# Patient Record
Sex: Female | Born: 1940
Health system: Southern US, Community
[De-identification: ages and names within clinical notes are randomized; demographics above are authoritative.]

## PROBLEM LIST (undated history)

## (undated) DIAGNOSIS — Z1379 Encounter for other screening for genetic and chromosomal anomalies: Secondary | ICD-10-CM

## (undated) DIAGNOSIS — R011 Cardiac murmur, unspecified: Secondary | ICD-10-CM

## (undated) DIAGNOSIS — M1990 Unspecified osteoarthritis, unspecified site: Secondary | ICD-10-CM

## (undated) DIAGNOSIS — Z803 Family history of malignant neoplasm of breast: Secondary | ICD-10-CM

## (undated) DIAGNOSIS — C801 Malignant (primary) neoplasm, unspecified: Secondary | ICD-10-CM

## (undated) DIAGNOSIS — E039 Hypothyroidism, unspecified: Secondary | ICD-10-CM

## (undated) DIAGNOSIS — M1710 Unilateral primary osteoarthritis, unspecified knee: Secondary | ICD-10-CM

## (undated) DIAGNOSIS — I10 Essential (primary) hypertension: Secondary | ICD-10-CM

## (undated) DIAGNOSIS — K219 Gastro-esophageal reflux disease without esophagitis: Secondary | ICD-10-CM

## (undated) DIAGNOSIS — Z8489 Family history of other specified conditions: Secondary | ICD-10-CM

## (undated) DIAGNOSIS — E785 Hyperlipidemia, unspecified: Secondary | ICD-10-CM

## (undated) DIAGNOSIS — M179 Osteoarthritis of knee, unspecified: Secondary | ICD-10-CM

## (undated) DIAGNOSIS — I728 Aneurysm of other specified arteries: Secondary | ICD-10-CM

## (undated) DIAGNOSIS — R634 Abnormal weight loss: Secondary | ICD-10-CM

## (undated) DIAGNOSIS — R42 Dizziness and giddiness: Secondary | ICD-10-CM

## (undated) DIAGNOSIS — C50919 Malignant neoplasm of unspecified site of unspecified female breast: Secondary | ICD-10-CM

## (undated) DIAGNOSIS — I739 Peripheral vascular disease, unspecified: Secondary | ICD-10-CM

## (undated) DIAGNOSIS — IMO0001 Reserved for inherently not codable concepts without codable children: Secondary | ICD-10-CM

## (undated) HISTORY — DX: Abnormal weight loss: R63.4

## (undated) HISTORY — DX: Gastro-esophageal reflux disease without esophagitis: K21.9

## (undated) HISTORY — PX: BACK SURGERY: SHX140

## (undated) HISTORY — DX: Dizziness and giddiness: R42

## (undated) HISTORY — DX: Hyperlipidemia, unspecified: E78.5

## (undated) HISTORY — DX: Family history of malignant neoplasm of breast: Z80.3

## (undated) HISTORY — PX: VAGINAL PROLAPSE REPAIR: SHX830

## (undated) HISTORY — PX: LUMBAR LAMINECTOMY: SHX95

## (undated) HISTORY — PX: CHOLECYSTECTOMY: SHX55

## (undated) HISTORY — DX: Encounter for other screening for genetic and chromosomal anomalies: Z13.79

## (undated) HISTORY — DX: Reserved for inherently not codable concepts without codable children: IMO0001

## (undated) HISTORY — DX: Essential (primary) hypertension: I10

## (undated) HISTORY — DX: Unspecified osteoarthritis, unspecified site: M19.90

## (undated) HISTORY — PX: BREAST SURGERY: SHX581

## (undated) HISTORY — PX: KNEE SURGERY: SHX244

## (undated) HISTORY — DX: Malignant neoplasm of unspecified site of unspecified female breast: C50.919

## (undated) HISTORY — PX: ABDOMINAL HYSTERECTOMY: SHX81

## (undated) HISTORY — DX: Peripheral vascular disease, unspecified: I73.9

---

## 2006-04-28 ENCOUNTER — Encounter: Admission: RE | Admit: 2006-04-28 | Discharge: 2006-04-28 | Payer: Self-pay | Admitting: Family Medicine

## 2010-02-15 ENCOUNTER — Encounter: Admission: RE | Admit: 2010-02-15 | Discharge: 2010-02-15 | Payer: Self-pay | Admitting: Family Medicine

## 2010-08-12 ENCOUNTER — Ambulatory Visit: Payer: Self-pay | Admitting: Surgery

## 2010-09-11 ENCOUNTER — Ambulatory Visit (HOSPITAL_COMMUNITY): Admission: RE | Admit: 2010-09-11 | Discharge: 2010-09-11 | Payer: Self-pay | Admitting: Gastroenterology

## 2011-04-15 NOTE — Assessment & Plan Note (Signed)
OFFICE VISIT   Crystal Trevino, Crystal Trevino  DOB:  04-Aug-1941                                       08/12/2010  OVFIE#:[id number]   REASON FOR VISIT:  Evaluate splenic aneurysm.   HISTORY:  This is a 70 year old female seen at the request of Dr. Selena Batten for evaluation of a splenic artery aneurysm.  The patient began  complaining of right upper quadrant abdominal pain as well as bloating  in March.  She was sent for a CT scan and a densely calcified 1.6 cm  splenic artery aneurysm was identified.  This was an incidental finding.  The patient denies having pain associated with this.   The patient is medically managed for hypertension and  hypercholesterolemia.  She is a nonsmoker.   REVIEW OF SYSTEMS:  GENERAL:  Positive for recent history, weight loss.  VASCULAR:  Negative.  CARDIAC:  Positive for shortness breath with exertion.  GI:  Positive for reflux, trouble swallowing, constipation.  NEURO:  Positive for dizziness.  PULMONARY:  Negative.  HEMATOLOGIC:  Negative.  GU: Negative.  ENT is negative.  MUSCULOSKELETAL:  Positive for arthritis and joint pain.  PSYCHIATRIC:  Negative.  SKIN:  Negative.   PAST MEDICAL HISTORY:  Hypertension and hypercholesterolemia.   PAST SURGICAL HISTORY:  Lumbar laminectomy, hysterectomy,  cholecystectomy, knee surgery.   SOCIAL HISTORY:  She is single with 4 children.  Works in Anim[employer].  Does not drink.  Does not smoke.   FAMILY HISTORY:  Negative for premature cardiovascular disease.   PHYSICAL EXAMINATION:  Vital signs:  Heart rate 78, blood pressure  133/75,  O2 saturations are 99%  on room air.  General:  She is well-  appearing, in no distress.  HEENT:  Within normal limits.  Lungs:  Clear  bilaterally.  No rales, rhonchi or wheezes.  Cardiovascular:  Regular  rate and rhythm.  No murmur.  No carotid bruits.  She has palpable  posterior tibial pulses bilaterally.  Abdomen:  Soft, nontender, no  hepatosplenomegaly.  Musculoskeletal:  Without major deformity.  Neurologic:  She is no focal deficits or weakness.  Skin:  Without rash.   DIAGNOSTIC STUDIES:  I have reviewed her CT scan which shows a heavily  calcified splenic artery aneurysm near the hilum measuring 1.6 cm in  maximal diameter.   ASSESSMENT/PLAN:  Splenic artery aneurysm.   PLAN:  I have  educated the patient about splenic artery aneurysms  today.  This most likely has been present for a long time.  However, it  was just incidentally discovered.  Being less than 2 cm, I think the  risk for rupture is extremely low.  I would not recommend any  intervention at this time.  I do think that since we only have one data  point  regarding her aneurysm that we will need to follow this.  I have  recommended a CT scan in 1 year to see if it has changed.  Hopefully we  can space out the time frame which we would monitor it.  I think the  best way to evaluate it is with CT scan.  Ultrasound may be possible.  However, it may be difficult to identify and measure on ultrasound.  I  will plan on seeing her back in 1 year.  All of her questions were  answered  todayJorge Ny, MD  Electronically Signed   VWB/MEDQ  D:  08/12/2010  T:  08/13/2010  Job:  [id number]   cc:   Pam Drown, M.D.

## 2011-04-16 ENCOUNTER — Other Ambulatory Visit: Payer: Self-pay | Admitting: Surgery

## 2011-04-16 DIAGNOSIS — I728 Aneurysm of other specified arteries: Secondary | ICD-10-CM

## 2011-07-21 ENCOUNTER — Encounter: Payer: Self-pay | Admitting: Surgery

## 2011-08-13 DIAGNOSIS — R42 Dizziness and giddiness: Secondary | ICD-10-CM

## 2011-08-13 HISTORY — DX: Dizziness and giddiness: R42

## 2011-08-14 ENCOUNTER — Other Ambulatory Visit: Payer: Self-pay | Admitting: Surgery

## 2011-08-15 ENCOUNTER — Encounter: Payer: Self-pay | Admitting: Surgery

## 2011-08-15 LAB — BUN: BUN: 17 mg/dL (ref 6–23)

## 2011-08-15 LAB — CREATININE, SERUM: Creat: 0.84 mg/dL (ref 0.50–1.10)

## 2011-08-18 ENCOUNTER — Encounter: Payer: Self-pay | Admitting: Surgery

## 2011-08-18 ENCOUNTER — Ambulatory Visit (INDEPENDENT_AMBULATORY_CARE_PROVIDER_SITE_OTHER): Payer: Federal, State, Local not specified - PPO | Admitting: Surgery

## 2011-08-18 ENCOUNTER — Ambulatory Visit
Admission: RE | Admit: 2011-08-18 | Discharge: 2011-08-18 | Disposition: A | Discharge status: Home / Self Care | Payer: Federal, State, Local not specified - PPO | Source: Ambulatory Visit | Attending: Surgery | Admitting: Surgery

## 2011-08-18 VITALS — BP 147/78 | HR 63 | Resp 16 | Ht 66.0 in | Wt 172.5 lb

## 2011-08-18 DIAGNOSIS — I728 Aneurysm of other specified arteries: Secondary | ICD-10-CM

## 2011-08-18 MED ORDER — IOHEXOL 350 MG/ML SOLN
100.0000 mL | Freq: Once | INTRAVENOUS | Status: AC | PRN
  Administered 2011-08-18: 100 mL via INTRAVENOUS

## 2011-08-18 NOTE — Progress Notes (Signed)
Vascular and Vein Specialist of [facility]   Patient name: Crystal Trevino MRN: [id number] DOB: [birth year] Sex: female   Referred by: Selena Batten  Reason for referral:  Chief Complaint  Patient presents with  . Aneurysm    followup  splenic artery aneurysm /ct done today    HISTORY OF PRESENT ILLNESS: This is a 70 year old female with I met one year ago for the evaluation of a splenic artery aneurysm. This was detected when she underwent a CT scan for right upper quadrant pain as well as bloating. This was an incidental finding. She continues to be without symptoms related to this. She denies left flank pain she denies back pain or epigastric pain.  Past Medical History  Diagnosis Date  . Hypertension   . Hyperlipidemia   . Arthritis   . Reflux   . Weight loss   . Constipation 08/13/2011  . Dizziness 08/13/2011  . AAA (abdominal aortic aneurysm)   . Peripheral vascular disease     Past Surgical History  Procedure Date  . Abdominal hysterectomy   . Cholecystectomy   . Lumbar laminectomy   . Knee surgery   . Pr vein bypass graft,aorto-fem-pop     History   Social History  . Marital Status: Legally Separated    Spouse Name: N/A    Number of Children: N/A  . Years of Education: N/A   Occupational History  . Not on file.   Social History Main Topics  . Smoking status: Never Smoker   . Smokeless tobacco: Never Used  . Alcohol Use: No  . Drug Use: No  . Sexually Active: Not on file   Other Topics Concern  . Not on file   Social History Narrative  . No narrative on file    No family history on file.  Allergies as of 08/18/2011 - Review Complete 08/18/2011  Allergen Reaction Noted  . Penicillins  07/21/2011    Current Outpatient Prescriptions on File Prior to Visit  Medication Sig Dispense Refill  . aspirin 81 MG tablet Take 81 mg by mouth daily.        . carvedilol (COREG) 12.5 MG tablet Take 12.5 mg by mouth 2 (two) times daily with a meal.        .  estrogens, conjugated, (PREMARIN) 0.625 MG tablet Take 0.625 mg by mouth daily. Take daily for 21 days then do not take for 7 days.       . fluticasone (FLOVENT DISKUS) 50 MCG/BLIST diskus inhaler Inhale 2 puffs into the lungs 2 (two) times daily.        Marland Kitchen levothyroxine (SYNTHROID, LEVOTHROID) 50 MCG tablet Take 50 mcg by mouth daily.        . Multiple Vitamin (MULTIVITAMIN PO) Take 1 tablet by mouth daily.        . simvastatin (ZOCOR) 20 MG tablet Take 20 mg by mouth at bedtime.        Marland Kitchen telmisartan-hydrochlorothiazide (MICARDIS HCT) 40-12.5 MG per tablet Take 1 tablet by mouth daily.        . clopidogrel (PLAVIX) 75 MG tablet Take 75 mg by mouth daily.        Marland Kitchen dicyclomine (BENTYL) 10 MG capsule Take 10 mg by mouth 4 (four) times daily -  before meals and at bedtime.        . gabapentin (NEURONTIN) 300 MG capsule Take 300 mg by mouth 3 (three) times daily.        Marland Kitchen lisinopril-hydrochlorothiazide (PRINZIDE,ZESTORETIC) 10-12.5 MG  per tablet Take 1 tablet by mouth 2 (two) times daily.        . methadone (DOLOPHINE) 5 MG tablet Take 5 mg by mouth every 8 (eight) hours.        . tapentadol (NUCYNTA) 50 MG TABS Take by mouth.         No current facility-administered medications on file prior to visit.     REVIEW OF SYSTEMS: Cardiovascular: No chest pain, chest pressure, palpitations, orthopnea, or dyspnea on exertion. No claudication or rest pain,  No history of DVT or phlebitis. Pulmonary: No productive cough, asthma or wheezing. Neurologic: No weakness, paresthesias, aphasia, or amaurosis. No dizziness. Hematologic: No bleeding problems or clotting disorders. Musculoskeletal: No joint pain or joint swelling. Gastrointestinal: No blood in stool or hematemesis Genitourinary: No dysuria or hematuria. Psychiatric:: No history of major depression. Integumentary: No rashes or ulcers. Constitutional: No fever or chills.  PHYSICAL EXAMINATION: General: The patient appears their stated age.  Vital  signs are BP 147/78  Pulse 63  Resp 16  Ht 5\' 6"  (1.676 m)  Wt 172 lb 8 oz (78.245 kg)  BMI 27.84 kg/m2 Pulmonary: There is a good air exchange bilaterally without wheezing or rales. Abdomen: Soft and non-tender with normal pitch bowel sounds. Musculoskeletal: There are no major deformities.  There is no significant extremity pain. Neurologic: No focal weakness or paresthesias are detected, Skin: There are no ulcer or rashes noted.  Diagnostic studies: CT scan today shows no significant change in her splenic aneurysm.  Crystal Trevino is a 70 y.o. female who presents with:  Chief Complaint  Patient presents with  . Aneurysm    followup  splenic artery aneurysm /ct done today   The patient splenic aneurysm has been stable over the course of the past year. She remains without symptoms. We discussed that if she were to develop pain in this area what to do. L. plan on seeing her back in one year with an abdominal ultrasound hopefully we will be able to visualize this with ultrasound to avoid putting her through multiple CAT scans. I think the likelihood of this rupturing is very small. I'll see her back in one year     Medication changes include: none   BRABHAM,VANCE W [facility] [place] Office: [phone]

## 2011-08-18 NOTE — Progress Notes (Signed)
Addended by: Sharee Pimple on: 08/18/2011 12:56 PM   Modules accepted: Orders

## 2012-08-20 ENCOUNTER — Encounter: Payer: Self-pay | Admitting: Surgery

## 2012-08-23 ENCOUNTER — Ambulatory Visit (INDEPENDENT_AMBULATORY_CARE_PROVIDER_SITE_OTHER): Payer: Federal, State, Local not specified - PPO | Admitting: Surgery

## 2012-08-23 ENCOUNTER — Ambulatory Visit (INDEPENDENT_AMBULATORY_CARE_PROVIDER_SITE_OTHER): Payer: Federal, State, Local not specified - PPO | Admitting: Vascular Surgery

## 2012-08-23 ENCOUNTER — Encounter: Payer: Self-pay | Admitting: Surgery

## 2012-08-23 VITALS — BP 143/79 | HR 67 | Resp 16 | Ht 66.0 in | Wt 172.0 lb

## 2012-08-23 DIAGNOSIS — I728 Aneurysm of other specified arteries: Secondary | ICD-10-CM

## 2012-08-23 NOTE — Progress Notes (Signed)
Vascular and Vein Specialist of [facility]   Patient name: Crystal Trevino MRN: [id number] DOB: 1941-05-21 Sex: female     Chief Complaint  Patient presents with  . Aneurysm    One year Splenic Aneurysm  f/up with vascular Lab study today    HISTORY OF PRESENT ILLNESS: The patient comes back today for followup of her splenic artery aneurysm. This was detected in 2011 point she underwent a CT scan for right upper quadrant pain and bloating. A CT scan last year showed a stable aneurysm in the splenic hilum measuring 1.6 cm. She is back today for followup. She denies any symptoms of left flank pain or abdominal pain.  Past Medical History  Diagnosis Date  . Hypertension   . Hyperlipidemia   . Arthritis   . Reflux   . Weight loss   . Constipation 08/13/2011  . Dizziness 08/13/2011  . AAA (abdominal aortic aneurysm)   . Peripheral vascular disease     Past Surgical History  Procedure Date  . Abdominal hysterectomy   . Cholecystectomy   . Lumbar laminectomy   . Knee surgery   . Pr vein bypass graft,aorto-fem-pop     History   Social History  . Marital Status: Legally Separated    Spouse Name: N/A    Number of Children: N/A  . Years of Education: N/A   Occupational History  . Not on file.   Social History Main Topics  . Smoking status: Never Smoker   . Smokeless tobacco: Never Used  . Alcohol Use: No  . Drug Use: No  . Sexually Active: Not on file   Other Topics Concern  . Not on file   Social History Narrative  . No narrative on file    Family History  Problem Relation Age of Onset  . Heart disease Mother   . Cancer Mother   . Heart disease Father   . Cancer Father     Allergies as of 08/23/2012 - Review Complete 08/23/2012  Allergen Reaction Noted  . Penicillins Nausea And Vomiting and Rash 07/21/2011    Current Outpatient Prescriptions on File Prior to Visit  Medication Sig Dispense Refill  . aspirin 81 MG tablet Take 81 mg by mouth daily.          . carvedilol (COREG) 12.5 MG tablet Take 12.5 mg by mouth 2 (two) times daily with a meal.        . estrogens, conjugated, (PREMARIN) 0.625 MG tablet Take 0.625 mg by mouth daily. Take daily for 21 days then do not take for 7 days.       . fluticasone (FLOVENT DISKUS) 50 MCG/BLIST diskus inhaler Inhale 2 puffs into the lungs 2 (two) times daily.        Marland Kitchen levothyroxine (SYNTHROID, LEVOTHROID) 50 MCG tablet Take 50 mcg by mouth daily.        . Multiple Vitamin (MULTIVITAMIN PO) Take 1 tablet by mouth daily.        . simvastatin (ZOCOR) 20 MG tablet Take 20 mg by mouth at bedtime.        Marland Kitchen telmisartan-hydrochlorothiazide (MICARDIS HCT) 40-12.5 MG per tablet Take 1 tablet by mouth daily.        . clopidogrel (PLAVIX) 75 MG tablet Take 75 mg by mouth daily.        Marland Kitchen dicyclomine (BENTYL) 10 MG capsule Take 10 mg by mouth 4 (four) times daily -  before meals and at bedtime.        Marland Kitchen  gabapentin (NEURONTIN) 300 MG capsule Take 300 mg by mouth 3 (three) times daily.        Marland Kitchen lisinopril-hydrochlorothiazide (PRINZIDE,ZESTORETIC) 10-12.5 MG per tablet Take 1 tablet by mouth 2 (two) times daily.        . methadone (DOLOPHINE) 5 MG tablet Take 5 mg by mouth every 8 (eight) hours.        . tapentadol (NUCYNTA) 50 MG TABS Take by mouth.           REVIEW OF SYSTEMS: No change since prior visit  PHYSICAL EXAMINATION:   Vital signs are BP 143/79  Pulse 67  Resp 16  Ht 5\' 6"  (1.676 m)  Wt 172 lb (78.019 kg)  BMI 27.76 kg/m2  SpO2 100% General: The patient appears their stated age. HEENT:  No gross abnormalities Pulmonary:  Non labored breathing Abdomen: Soft and non-tender no left upper quadrant tenderness Musculoskeletal: There are no major deformities. Neurologic: No focal weakness or paresthesias are detected, Skin: There are no ulcer or rashes noted. Psychiatric: The patient has normal affect. Cardiovascular: There is a regular rate and rhythm without significant murmur appreciated. No carotid  bruits. Palpable left posterior tibial pulse faintly palpable right posterior tibial. No swelling   Diagnostic Studies Ultrasound was ordered and reviewed today. Due to bowel gas and small vessel caliber the splenic artery aneurysm could not be identified  Assessment: Splenic artery aneurysm Plan: Unfortunately this could not be visualized today by ultrasound. I believe since the patient has had a CT scan one year apart that shows a stable diameter, this can be observed again in one year. I believe I'll have to order a CT scan since it could not be visualized today with ultrasound. The patient remained asymptomatic. I reiterated that this has been present most likely for many many years. Since that has remained stable I did not feel that we need to get a CT scan at this time.  Jorge Ny, M.D. [facility] of [place] Office: [phone] Pager:  [phone]

## 2012-08-23 NOTE — Addendum Note (Signed)
Addended by: Sharee Pimple on: 08/23/2012 10:58 AM   Modules accepted: Orders

## 2013-08-26 ENCOUNTER — Encounter: Payer: Self-pay | Admitting: Surgery

## 2013-08-26 ENCOUNTER — Other Ambulatory Visit: Payer: Self-pay | Admitting: Surgery

## 2013-08-29 ENCOUNTER — Ambulatory Visit: Payer: Federal, State, Local not specified - PPO | Admitting: Surgery

## 2013-08-29 ENCOUNTER — Ambulatory Visit
Admission: RE | Admit: 2013-08-29 | Discharge: 2013-08-29 | Disposition: A | Discharge status: Home / Self Care | Payer: Federal, State, Local not specified - PPO | Source: Ambulatory Visit | Attending: Surgery | Admitting: Surgery

## 2013-08-29 DIAGNOSIS — I728 Aneurysm of other specified arteries: Secondary | ICD-10-CM

## 2013-08-29 LAB — BUN: BUN: 16 mg/dL (ref 6–23)

## 2013-08-29 LAB — CREATININE, SERUM: Creat: 0.94 mg/dL (ref 0.50–1.10)

## 2013-09-05 ENCOUNTER — Other Ambulatory Visit: Payer: Self-pay | Admitting: Family Medicine

## 2013-09-05 ENCOUNTER — Other Ambulatory Visit: Payer: Federal, State, Local not specified - PPO

## 2013-09-05 ENCOUNTER — Ambulatory Visit: Payer: Federal, State, Local not specified - PPO | Admitting: Surgery

## 2013-09-05 ENCOUNTER — Other Ambulatory Visit: Payer: Self-pay

## 2013-09-05 DIAGNOSIS — I728 Aneurysm of other specified arteries: Secondary | ICD-10-CM

## 2013-09-13 ENCOUNTER — Ambulatory Visit
Admission: RE | Admit: 2013-09-13 | Discharge: 2013-09-13 | Disposition: A | Discharge status: Home / Self Care | Payer: Federal, State, Local not specified - PPO | Source: Ambulatory Visit | Attending: Family Medicine | Admitting: Family Medicine

## 2013-09-13 ENCOUNTER — Other Ambulatory Visit: Payer: Federal, State, Local not specified - PPO

## 2013-09-13 DIAGNOSIS — I728 Aneurysm of other specified arteries: Secondary | ICD-10-CM

## 2013-09-13 MED ORDER — IOHEXOL 350 MG/ML SOLN
100.0000 mL | Freq: Once | INTRAVENOUS | Status: AC | PRN
  Administered 2013-09-13: 100 mL via INTRAVENOUS

## 2013-10-12 ENCOUNTER — Telehealth: Payer: Self-pay | Admitting: Surgery

## 2013-10-12 NOTE — Telephone Encounter (Signed)
Left message with Harriett Sine to have patient call. Need to set up CTA and office visit that was not done in October.

## 2013-11-14 ENCOUNTER — Ambulatory Visit: Payer: Federal, State, Local not specified - PPO | Admitting: Surgery

## 2014-09-01 ENCOUNTER — Other Ambulatory Visit: Payer: Self-pay | Admitting: Orthopedic Surgery

## 2014-09-01 DIAGNOSIS — R609 Edema, unspecified: Secondary | ICD-10-CM

## 2014-09-01 DIAGNOSIS — M25561 Pain in right knee: Secondary | ICD-10-CM

## 2014-09-01 DIAGNOSIS — R531 Weakness: Secondary | ICD-10-CM

## 2014-09-09 ENCOUNTER — Ambulatory Visit
Admission: RE | Admit: 2014-09-09 | Discharge: 2014-09-09 | Disposition: A | Discharge status: Home / Self Care | Payer: Federal, State, Local not specified - PPO | Source: Ambulatory Visit | Attending: Orthopedic Surgery | Admitting: Orthopedic Surgery

## 2014-09-09 DIAGNOSIS — R531 Weakness: Secondary | ICD-10-CM

## 2014-09-09 DIAGNOSIS — M25561 Pain in right knee: Secondary | ICD-10-CM

## 2014-09-09 DIAGNOSIS — R609 Edema, unspecified: Secondary | ICD-10-CM

## 2016-04-30 DIAGNOSIS — I10 Essential (primary) hypertension: Secondary | ICD-10-CM | POA: Diagnosis not present

## 2016-04-30 DIAGNOSIS — E782 Mixed hyperlipidemia: Secondary | ICD-10-CM | POA: Diagnosis not present

## 2016-04-30 DIAGNOSIS — E039 Hypothyroidism, unspecified: Secondary | ICD-10-CM | POA: Diagnosis not present

## 2016-04-30 DIAGNOSIS — R7301 Impaired fasting glucose: Secondary | ICD-10-CM | POA: Diagnosis not present

## 2016-05-02 DIAGNOSIS — Z1231 Encounter for screening mammogram for malignant neoplasm of breast: Secondary | ICD-10-CM | POA: Diagnosis not present

## 2016-05-02 DIAGNOSIS — Z803 Family history of malignant neoplasm of breast: Secondary | ICD-10-CM | POA: Diagnosis not present

## 2016-09-18 DIAGNOSIS — Z23 Encounter for immunization: Secondary | ICD-10-CM | POA: Diagnosis not present

## 2016-10-29 DIAGNOSIS — E039 Hypothyroidism, unspecified: Secondary | ICD-10-CM | POA: Diagnosis not present

## 2016-10-29 DIAGNOSIS — Z0000 Encounter for general adult medical examination without abnormal findings: Secondary | ICD-10-CM | POA: Diagnosis not present

## 2017-02-12 DIAGNOSIS — J069 Acute upper respiratory infection, unspecified: Secondary | ICD-10-CM | POA: Diagnosis not present

## 2017-05-15 DIAGNOSIS — I10 Essential (primary) hypertension: Secondary | ICD-10-CM | POA: Diagnosis not present

## 2017-05-15 DIAGNOSIS — E039 Hypothyroidism, unspecified: Secondary | ICD-10-CM | POA: Diagnosis not present

## 2017-05-15 DIAGNOSIS — E782 Mixed hyperlipidemia: Secondary | ICD-10-CM | POA: Diagnosis not present

## 2017-05-15 DIAGNOSIS — M85851 Other specified disorders of bone density and structure, right thigh: Secondary | ICD-10-CM | POA: Diagnosis not present

## 2017-05-15 DIAGNOSIS — R7301 Impaired fasting glucose: Secondary | ICD-10-CM | POA: Diagnosis not present

## 2017-09-07 DIAGNOSIS — Z23 Encounter for immunization: Secondary | ICD-10-CM | POA: Diagnosis not present

## 2017-09-17 DIAGNOSIS — M11261 Other chondrocalcinosis, right knee: Secondary | ICD-10-CM | POA: Diagnosis not present

## 2017-09-17 DIAGNOSIS — G8929 Other chronic pain: Secondary | ICD-10-CM | POA: Diagnosis not present

## 2017-09-17 DIAGNOSIS — M1711 Unilateral primary osteoarthritis, right knee: Secondary | ICD-10-CM | POA: Diagnosis not present

## 2017-09-17 DIAGNOSIS — M25561 Pain in right knee: Secondary | ICD-10-CM | POA: Diagnosis not present

## 2017-10-09 DIAGNOSIS — R7301 Impaired fasting glucose: Secondary | ICD-10-CM | POA: Diagnosis not present

## 2017-10-09 DIAGNOSIS — E039 Hypothyroidism, unspecified: Secondary | ICD-10-CM | POA: Diagnosis not present

## 2017-10-09 DIAGNOSIS — N993 Prolapse of vaginal vault after hysterectomy: Secondary | ICD-10-CM | POA: Diagnosis not present

## 2017-10-09 DIAGNOSIS — M1711 Unilateral primary osteoarthritis, right knee: Secondary | ICD-10-CM | POA: Diagnosis not present

## 2017-10-30 ENCOUNTER — Other Ambulatory Visit: Payer: Self-pay | Admitting: Orthopedic Surgery

## 2017-12-09 DIAGNOSIS — Z1231 Encounter for screening mammogram for malignant neoplasm of breast: Secondary | ICD-10-CM | POA: Diagnosis not present

## 2017-12-09 DIAGNOSIS — Z803 Family history of malignant neoplasm of breast: Secondary | ICD-10-CM | POA: Diagnosis not present

## 2017-12-18 DIAGNOSIS — R921 Mammographic calcification found on diagnostic imaging of breast: Secondary | ICD-10-CM | POA: Diagnosis not present

## 2017-12-21 ENCOUNTER — Other Ambulatory Visit: Payer: Self-pay | Admitting: Radiology

## 2017-12-21 DIAGNOSIS — R920 Mammographic microcalcification found on diagnostic imaging of breast: Secondary | ICD-10-CM | POA: Diagnosis not present

## 2017-12-21 DIAGNOSIS — D0511 Intraductal carcinoma in situ of right breast: Secondary | ICD-10-CM | POA: Diagnosis not present

## 2017-12-21 DIAGNOSIS — Z803 Family history of malignant neoplasm of breast: Secondary | ICD-10-CM | POA: Diagnosis not present

## 2017-12-21 DIAGNOSIS — R921 Mammographic calcification found on diagnostic imaging of breast: Secondary | ICD-10-CM | POA: Diagnosis not present

## 2017-12-23 ENCOUNTER — Encounter: Payer: Self-pay | Admitting: *Deleted

## 2017-12-23 ENCOUNTER — Telehealth: Payer: Self-pay | Admitting: Oncology

## 2017-12-23 DIAGNOSIS — N993 Prolapse of vaginal vault after hysterectomy: Secondary | ICD-10-CM | POA: Diagnosis not present

## 2017-12-23 DIAGNOSIS — D0511 Intraductal carcinoma in situ of right breast: Secondary | ICD-10-CM

## 2017-12-23 DIAGNOSIS — C50919 Malignant neoplasm of unspecified site of unspecified female breast: Secondary | ICD-10-CM | POA: Diagnosis not present

## 2017-12-23 NOTE — Telephone Encounter (Signed)
Confirmed with the patient morning [facility] appointment on 12/30/16, [facility] patient no packet sent but appointment reminder was sent

## 2017-12-25 ENCOUNTER — Encounter (HOSPITAL_COMMUNITY): Payer: Self-pay

## 2017-12-25 NOTE — Pre-Procedure Instructions (Signed)
Crystal Trevino  12/25/2017      [id number] - [place], [place] [address] [place] [address] Phone: [phone] Fax: [phone]    Your procedure is scheduled on Monday, Feb. 4th   Report to [facility] Admitting at 5:30 AM             (posted surgery time 7:30a - 8:45a)   Call this number if you have problems the morning of surgery:  [phone]   Remember:               4-5 days prior to surgery, STOP TAKING any Vitamins, Herbal Supplements, Anti-inflammatories, Blood Thinners.   Do not eat food or drink liquids after midnight, Sunday.   Take these medicines the morning of surgery with A SIP OF WATER : Carvedilol, Levothyroxine   Do not wear jewelry, make-up or nail polish.  Do not wear lotions, powders, perfumes, or deodorant.  Do not shave 48 hours prior to surgery.    Do not bring valuables to the hospital.  [facility] is not responsible for any belongings or valuables.  Contacts, dentures or bridgework may not be worn into surgery.  Leave your suitcase in the car.  After surgery it may be brought to your room.  For patients admitted to the hospital, discharge time will be determined by your treatment team.  Please read over the following fact sheets that you were given. Pain Booklet, MRSA Information and Surgical Site Infection Prevention      [place]- Preparing For Surgery  Before surgery, you can play an important role. Because skin is not sterile, your skin needs to be as free of germs as possible. You can reduce the number of germs on your skin by washing with CHG (chlorahexidine gluconate) Soap before surgery.  CHG is an antiseptic cleaner which kills germs and bonds with the skin to continue killing germs even after washing.  Please do not use if you have an allergy to CHG or antibacterial soaps. If your skin becomes reddened/irritated stop using the CHG.  Do not shave (including legs and underarms) for  at least 48 hours prior to first CHG shower. It is OK to shave your face.  Please follow these instructions carefully.   1. Shower the NIGHT BEFORE SURGERY and the MORNING OF SURGERY with CHG.   2. If you chose to wash your hair, wash your hair first as usual with your normal shampoo.  3. After you shampoo, rinse your hair and body thoroughly to remove the shampoo.  4. Use CHG as you would any other liquid soap. You can apply CHG directly to the skin and wash gently with a scrungie or a clean washcloth.   5. Apply the CHG Soap to your body ONLY FROM THE NECK DOWN.  Do not use on open wounds or open sores. Avoid contact with your eyes, ears, mouth and genitals (private parts). Wash Face and genitals (private parts)  with your normal soap.  6. Wash thoroughly, paying special attention to the area where your surgery will be performed.  7. Thoroughly rinse your body with warm water from the neck down.  8. DO NOT shower/wash with your normal soap after using and rinsing off the CHG Soap.  9. Pat yourself dry with a CLEAN TOWEL.  10. Wear CLEAN PAJAMAS to bed the night before surgery, wear comfortable clothes the morning of surgery  11. Place CLEAN SHEETS on your bed the  night of your first shower and DO NOT SLEEP WITH PETS.    Day of Surgery: Do not apply any deodorants/lotions. Please wear clean clothes to the hospital/surgery center.

## 2017-12-28 ENCOUNTER — Other Ambulatory Visit: Payer: Self-pay

## 2017-12-28 ENCOUNTER — Encounter (HOSPITAL_COMMUNITY): Payer: Self-pay

## 2017-12-28 ENCOUNTER — Encounter (HOSPITAL_COMMUNITY)
Admission: RE | Admit: 2017-12-28 | Discharge: 2017-12-28 | Disposition: A | Discharge status: Home / Self Care | Payer: Federal, State, Local not specified - PPO | Source: Ambulatory Visit | Attending: Orthopedic Surgery | Admitting: Orthopedic Surgery

## 2017-12-28 DIAGNOSIS — E039 Hypothyroidism, unspecified: Secondary | ICD-10-CM | POA: Diagnosis not present

## 2017-12-28 DIAGNOSIS — I728 Aneurysm of other specified arteries: Secondary | ICD-10-CM | POA: Insufficient documentation

## 2017-12-28 DIAGNOSIS — Z79890 Hormone replacement therapy (postmenopausal): Secondary | ICD-10-CM | POA: Diagnosis not present

## 2017-12-28 DIAGNOSIS — Z01818 Encounter for other preprocedural examination: Secondary | ICD-10-CM | POA: Insufficient documentation

## 2017-12-28 DIAGNOSIS — K219 Gastro-esophageal reflux disease without esophagitis: Secondary | ICD-10-CM | POA: Diagnosis not present

## 2017-12-28 DIAGNOSIS — Z79899 Other long term (current) drug therapy: Secondary | ICD-10-CM | POA: Insufficient documentation

## 2017-12-28 DIAGNOSIS — I10 Essential (primary) hypertension: Secondary | ICD-10-CM | POA: Diagnosis not present

## 2017-12-28 DIAGNOSIS — E785 Hyperlipidemia, unspecified: Secondary | ICD-10-CM | POA: Insufficient documentation

## 2017-12-28 DIAGNOSIS — Z01812 Encounter for preprocedural laboratory examination: Secondary | ICD-10-CM | POA: Diagnosis not present

## 2017-12-28 DIAGNOSIS — Z7982 Long term (current) use of aspirin: Secondary | ICD-10-CM | POA: Diagnosis not present

## 2017-12-28 HISTORY — DX: Gastro-esophageal reflux disease without esophagitis: K21.9

## 2017-12-28 HISTORY — DX: Hypothyroidism, unspecified: E03.9

## 2017-12-28 HISTORY — DX: Cardiac murmur, unspecified: R01.1

## 2017-12-28 HISTORY — DX: Malignant (primary) neoplasm, unspecified: C80.1

## 2017-12-28 HISTORY — DX: Aneurysm of other specified arteries: I72.8

## 2017-12-28 LAB — CBC WITH DIFFERENTIAL/PLATELET
Basophils Absolute: 0 10*3/uL (ref 0.0–0.1)
Basophils Relative: 0 %
Eosinophils Absolute: 0.2 10*3/uL (ref 0.0–0.7)
Eosinophils Relative: 3 %
HCT: 37.9 % (ref 36.0–46.0)
Hemoglobin: 12.3 g/dL (ref 12.0–15.0)
Lymphocytes Relative: 24 %
Lymphs Abs: 1.6 10*3/uL (ref 0.7–4.0)
MCH: 30.5 pg (ref 26.0–34.0)
MCHC: 32.5 g/dL (ref 30.0–36.0)
MCV: 94 fL (ref 78.0–100.0)
Monocytes Absolute: 0.5 10*3/uL (ref 0.1–1.0)
Monocytes Relative: 8 %
Neutro Abs: 4.4 10*3/uL (ref 1.7–7.7)
Neutrophils Relative %: 65 %
Platelets: 223 10*3/uL (ref 150–400)
RBC: 4.03 MIL/uL (ref 3.87–5.11)
RDW: 13.8 % (ref 11.5–15.5)
WBC: 6.6 10*3/uL (ref 4.0–10.5)

## 2017-12-28 LAB — COMPREHENSIVE METABOLIC PANEL
ALT: 18 U/L (ref 14–54)
AST: 23 U/L (ref 15–41)
Albumin: 4 g/dL (ref 3.5–5.0)
Alkaline Phosphatase: 59 U/L (ref 38–126)
Anion gap: 12 (ref 5–15)
BUN: 14 mg/dL (ref 6–20)
CO2: 22 mmol/L (ref 22–32)
Calcium: 9.3 mg/dL (ref 8.9–10.3)
Chloride: 105 mmol/L (ref 101–111)
Creatinine, Ser: 0.93 mg/dL (ref 0.44–1.00)
GFR calc Af Amer: >60 mL/min (ref >60)
GFR calc non Af Amer: 59 mL/min — ABNORMAL LOW (ref >60)
Glucose, Bld: 113 mg/dL — ABNORMAL HIGH (ref 65–99)
Potassium: 4.3 mmol/L (ref 3.5–5.1)
Sodium: 139 mmol/L (ref 135–145)
Total Bilirubin: 1.3 mg/dL — ABNORMAL HIGH (ref 0.3–1.2)
Total Protein: 6.6 g/dL (ref 6.5–8.1)

## 2017-12-28 LAB — SURGICAL PCR SCREEN
MRSA, PCR: NEGATIVE
Staphylococcus aureus: POSITIVE — AB

## 2017-12-28 NOTE — Progress Notes (Signed)
PCP is Dr. Amedeo Gory  LOV 10/2017 She stated she did have lower aortic aneurysm, was seen by Dr. Trula Slade in 2013 Denies any murmur, cardiac studies nor seeing a cardiologist. Recent dx of ?? Breast cancer.  Going to see Dr. Sondra Come on 1/30 @ [facility].

## 2017-12-29 ENCOUNTER — Encounter (HOSPITAL_COMMUNITY): Payer: Self-pay

## 2017-12-29 NOTE — Progress Notes (Signed)
Anesthesia Chart Review:  Pt is a 77 year old female scheduled for R total knee arthroplasty on 01/04/2018 with Vickey Huger, MD  - PCP is Cari Caraway, MD who cleared pt for surgery at office visit 10/09/17  PMH includes:  HTN, hyperlipidemia, heart murmur (1/6 systolic ejection murmur loudest at R upper sternal border per PCP notes), splenic artery aneurysm (1.5 cm by 2014 CT, no further surveillance recommended), hypothyroidism, GERD. Never smoker. BMI 27  Medications include: ASA 81 mg, carvedilol, levothyroxine, simvastatin, telmisartan  BP (!) 164/63   Pulse 76   Temp 36.7 C   Resp 20   Ht 5\' 6"  (1.676 m)   Wt 168 lb 11.2 oz (76.5 kg)   SpO2 98%   BMI 27.23 kg/m   Preoperative labs reviewed.    EKG 12/28/17: NSR  If no changes, I anticipate pt can proceed with surgery as scheduled.   Willeen Cass, FNP-BC [facility] Stay [facility]/Anesthesiology Phone: [phone] 12/29/2017 12:28 PM

## 2017-12-30 ENCOUNTER — Encounter: Payer: Self-pay | Admitting: Oncology

## 2017-12-30 ENCOUNTER — Inpatient Hospital Stay: Payer: Federal, State, Local not specified - PPO | Attending: Oncology | Admitting: Oncology

## 2017-12-30 ENCOUNTER — Encounter: Payer: Self-pay | Admitting: *Deleted

## 2017-12-30 ENCOUNTER — Ambulatory Visit: Payer: Federal, State, Local not specified - PPO | Admitting: Physical Therapy

## 2017-12-30 ENCOUNTER — Ambulatory Visit: Payer: Self-pay | Admitting: Surgery

## 2017-12-30 ENCOUNTER — Ambulatory Visit
Admission: RE | Admit: 2017-12-30 | Discharge: 2017-12-30 | Disposition: A | Discharge status: Home / Self Care | Payer: Federal, State, Local not specified - PPO | Source: Ambulatory Visit | Attending: Radiation Oncology | Admitting: Radiation Oncology

## 2017-12-30 ENCOUNTER — Other Ambulatory Visit: Payer: Self-pay | Admitting: *Deleted

## 2017-12-30 VITALS — BP 160/74 | HR 83 | Temp 97.7°F | Resp 18 | Ht 66.0 in | Wt 170.1 lb

## 2017-12-30 DIAGNOSIS — N952 Postmenopausal atrophic vaginitis: Secondary | ICD-10-CM | POA: Diagnosis not present

## 2017-12-30 DIAGNOSIS — Z170 Estrogen receptor positive status [ER+]: Secondary | ICD-10-CM | POA: Insufficient documentation

## 2017-12-30 DIAGNOSIS — N993 Prolapse of vaginal vault after hysterectomy: Secondary | ICD-10-CM | POA: Diagnosis not present

## 2017-12-30 DIAGNOSIS — D0591 Unspecified type of carcinoma in situ of right breast: Secondary | ICD-10-CM

## 2017-12-30 DIAGNOSIS — C50911 Malignant neoplasm of unspecified site of right female breast: Secondary | ICD-10-CM | POA: Diagnosis not present

## 2017-12-30 DIAGNOSIS — M1711 Unilateral primary osteoarthritis, right knee: Secondary | ICD-10-CM | POA: Diagnosis not present

## 2017-12-30 DIAGNOSIS — E039 Hypothyroidism, unspecified: Secondary | ICD-10-CM | POA: Diagnosis not present

## 2017-12-30 DIAGNOSIS — D0511 Intraductal carcinoma in situ of right breast: Secondary | ICD-10-CM

## 2017-12-30 DIAGNOSIS — Z803 Family history of malignant neoplasm of breast: Secondary | ICD-10-CM | POA: Diagnosis not present

## 2017-12-30 MED ORDER — GABAPENTIN 100 MG PO CAPS: 100.0000 mg | ORAL_CAPSULE | Freq: Every day | ORAL | 4 refills | Status: DC

## 2017-12-30 NOTE — Progress Notes (Signed)
Clinical Social Work [place] Psychosocial Distress Screening [place]  Patient completed distress screening protocol and scored a 10 on the Psychosocial Distress Thermometer which indicates severe distress. Clinical Social Worker met with patient and patients daughter in [facility] to assess for distress and other psychosocial needs. Patient stated she was feeling overwhelmed but felt "better" after meeting with the treatment team and getting more information on her treatment plan.  Patient stated her major concern was her upcoming knee surgery, and being able to move forward with both the surgery and treatment.  CSW and patient discussed common feeling and emotions when being diagnosed with cancer, and the importance of support during treatment. CSW informed patient of the support team and support services at [facility]. CSW provided contact information and encouraged patient to call with any questions or concerns.  ONCBCN DISTRESS SCREENING 12/30/2017  Screening Type Initial Screening  Distress experienced in past week (1-10) 10  Emotional problem type Nervousness/Anxiety     Johnnye Lana, MSW, LCSW, OSW-C Clinical Social Worker [place] [phone]

## 2017-12-30 NOTE — Progress Notes (Unsigned)
Nutrition Assessment  Reason for Assessment:  Pt seen in Breast Clinic  ASSESSMENT:   77 year old female with new diagnosis of breast cancer.  Planning knee replacement surgery on 2/4. Past medical history of HTN, high cholesterol.    Patient reports decreased appetite due to new diagnosis and upcoming knee surgery.  Medications:  reviewed  Labs: reviewed  Anthropometrics:   Height: 66 inches Weight: 170 lb 1.6 BMI: 27   NUTRITION DIAGNOSIS: Food and nutrition related knowledge deficit related to new diagnosis of breast cancer as evidenced by no prior need for nutrition related information.  INTERVENTION:   Discussed and provided packet of information regarding nutritional tips for breast cancer patients.  Questions answered.  Teachback method used.  Contact information provided and patient knows to contact me with questions/concerns.    MONITORING, EVALUATION, and GOAL: Pt will consume a healthy plant based diet to maintain lean body mass throughout treatment.   Crystal Trevino B. Zenia Resides, [place], [place] Registered Dietitian [phone] (pager)

## 2017-12-30 NOTE — Progress Notes (Signed)
Radiation Oncology         [phone]) [phone] ________________________________  Initial outpatient Consultation  Name: Crystal Trevino MRN: [id number]  Date: 12/30/2017  DOB: 10-15-1941  CZ:[id number], Crystal Butts, MD  Alphonsa Overall, MD   REFERRING PHYSICIAN: Alphonsa Overall, MD  DIAGNOSIS: The encounter diagnosis was Ductal carcinoma in situ (DCIS) of right breast. ductal carcinoma in situ, high-grade, estrogen and progesterone receptor positive presenting in the lower outer quadrant of the right breast.  HISTORY OF PRESENT ILLNESS::Crystal Trevino is a 77 y.o. female who is seen as part of the multidisciplinary breast clinic today. Earlier today the patient's imaging studies and pathology was reviewed in the multidisciplinary breast conference.. She underwent a routine screening mammography on 12/09/2017 showing a possible abnormality in the right breast. She underwent bilateral diagnostic mammography with tomography and right breast ultrasonography at [facility] on 12/18/2017 the breast density to be category C.  There was an area of grouped heterogeneous calcifications in the lower outer quadrant but no significant masses ultrasonography found no abnormalities in the right axilla.    Accordingly on 12/21/2017 she proceeded to biopsy of the right breast area in question. 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 With this information the patient is now seen for evaluation and management recommendations    PREVIOUS RADIATION THERAPY: No  PAST MEDICAL HISTORY:  has a past medical history of Arthritis, Cancer ([place]), Constipation (08/13/2011), Dizziness (08/13/2011), GERD (gastroesophageal reflux disease), Heart murmur, Hyperlipidemia, Hypertension, Hypothyroidism, Peripheral vascular disease ([place]), Reflux, Splenic artery aneurysm  ([place]), and Weight loss.    PAST SURGICAL HISTORY: Past Surgical History:  Procedure Laterality Date  . ABDOMINAL HYSTERECTOMY    . BACK SURGERY    . BREAST SURGERY    . CHOLECYSTECTOMY    . KNEE SURGERY    . LUMBAR LAMINECTOMY    . PR VEIN BYPASS GRAFT,AORTO-FEM-POP     PT DENIES ANY TYPE OF SURGERY OF THIS KIND  . TONSILLECTOMY    . VAGINAL PROLAPSE REPAIR     2015   DONE IN [place]    FAMILY HISTORY: family history includes Breast cancer in her daughter; Cancer in her father and mother; Heart disease in her father and mother.  SOCIAL HISTORY:  reports that  has never smoked. she has never used smokeless tobacco. She reports that she does not drink alcohol or use drugs.  ALLERGIES: Penicillins  MEDICATIONS:  Current Outpatient Medications  Medication Sig Dispense Refill  . aspirin 81 MG tablet Take 81 mg by mouth daily.      . carvedilol (COREG) 12.5 MG tablet Take 12.5 mg by mouth 2 (two) times daily with a meal.      . cholecalciferol (VITAMIN D) 1000 UNITS tablet Take 1,000 Units by mouth daily.    Marland Kitchen estrogens, conjugated, (PREMARIN) 0.3 MG tablet Take 0.3 mg by mouth every other day.     . gabapentin (NEURONTIN) 100 MG capsule Take 1 capsule (100 mg total) by mouth at bedtime. 90 capsule 4  . levothyroxine (SYNTHROID, LEVOTHROID) 75 MCG tablet Take 75 mcg by mouth daily before breakfast.     . Multiple Vitamin (MULTIVITAMIN PO) Take 1 tablet by mouth daily.      . simvastatin (ZOCOR) 20 MG tablet Take 20 mg by mouth at bedtime.      Marland Kitchen telmisartan (MICARDIS) 20 MG tablet Take 20 mg by mouth daily.  No current facility-administered medications for this encounter.     REVIEW OF SYSTEMS: REVIEW OF SYSTEMS: A 10+ POINT REVIEW OF SYSTEMS WAS OBTAINED including neurology, dermatology, psychiatry, cardiac, respiratory, lymph, extremities, GI, GU, musculoskeletal, constitutional, reproductive, HEENT. All pertinent positives are noted in the HPI. All others are negative. Prior  to biopsy the patient denies any pain in the breast area nipple discharge or bleeding   PHYSICAL EXAM:  Vitals - 1 value per visit 0/62/6948  SYSTOLIC 546  DIASTOLIC 74  Pulse 83  Temperature 97.7  Respirations 18  Weight (lb) 170.1  Height 5\' 6"   BMI 27.45  VISIT REPORT    Lungs are clear to auscultation bilaterally. Heart has regular rate and rhythm. No palpable cervical, supraclavicular, or axillary adenopathy. Abdomen soft, non-tender, normal bowel sounds.   ECOG = 0  0 - Asymptomatic (Fully active, able to carry on all predisease activities without restriction)  1 - Symptomatic but completely ambulatory (Restricted in physically strenuous activity but ambulatory and able to carry out work of a light or sedentary nature. For example, light housework, office work)  2 - Symptomatic, <50% in bed during the day (Ambulatory and capable of all self care but unable to carry out any work activities. Up and about more than 50% of waking hours)  3 - Symptomatic, >50% in bed, but not bedbound (Capable of only limited self-care, confined to bed or chair 50% or more of waking hours)  4 - Bedbound (Completely disabled. Cannot carry on any self-care. Totally confined to bed or chair)  5 - Death   Eustace Pen MM, Creech RH, Tormey DC, et al. [phone]). "Toxicity and response criteria of the [facility]". [place] Oncol. 5 (6): 649-55  LABORATORY DATA:  Lab Results  Component Value Date   WBC 6.6 12/28/2017   HGB 12.3 12/28/2017   HCT 37.9 12/28/2017   MCV 94.0 12/28/2017   PLT 223 12/28/2017   NEUTROABS 4.4 12/28/2017   Lab Results  Component Value Date   NA 139 12/28/2017   K 4.3 12/28/2017   CL 105 12/28/2017   CO2 22 12/28/2017   GLUCOSE 113 (H) 12/28/2017   CREATININE 0.93 12/28/2017   CALCIUM 9.3 12/28/2017      RADIOGRAPHY: No results found.    IMPRESSION: 77 y.o. [name], [place] Woman status post right breast lower outer quadrant biopsy on 12/21/2017  for a ductal carcinoma in situ, high-grade, estrogen and progesterone receptor positive patient would appear to be a good candidate for breast conserving surgery partial mastectomy planned. In light of the high-grade nature of her lesion we would also recommend adjuvant radiation therapy as heart of her management. I discussed the overall treatment course side effects and potential toxicities of radiation therapy in this situation with the patient and her daughter. The patient appears to understand and wishes to proceed with radiation therapy as planned    PLAN:(1) genetics testing pending (mother and daughter with breast cancer)  (2) MRI of the breast given the density category noted on imaging studies, to rule out other potential lesions.  (3) breast conserving surgery without sentinel lymph node  (4) adjuvant radiation (lesion appears large on imaging studies, 3 cm area of calc. and is high-grade)  (5) consider adjuvant hormone therapy      ------------------------------------------------  Blair Promise, PhD, MD

## 2017-12-30 NOTE — Progress Notes (Signed)
[facility]  [phone] [phone]     ID: WAVER DIBIASIO DOB: November 30, 1941  MR#: [id number]  YSA#:[id number]  Patient Care Team: Cari Caraway, MD as PCP - General (Family Medicine) Magrinat, Virgie Dad, MD as Consulting Physician (Oncology) Vickey Huger, MD as Consulting Physician (Orthopedic Surgery) Marti Sleigh, MD as Consulting Physician (Gynecology) Gery Pray, MD as Consulting Physician (Radiation Oncology) Alphonsa Overall, MD as Consulting Physician (General Surgery) Marti Sleigh, MD as Consulting Physician (Gynecology) OTHER MD:  CHIEF COMPLAINT: Estrogen receptor positive DCIS  CURRENT TREATMENT: Awaiting definitive surgery   HISTORY OF CURRENT ILLNESS: "Dot" had routine screening mammography on 12/09/2017 showing a possible abnormality in the right breast. She underwent bilateral diagnostic mammography with tomography and right breast ultrasonography at [facility] on 12/18/2017 the breast density to be category C.  There was an area of grouped heterogeneous calcifications in the lower outer quadrant but no significant masses ultrasonography found no abnormalities in the right axilla.    Accordingly on 12/21/2017 she proceeded to biopsy of the right breast area in question. 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  The patient's subsequent history is as detailed below.  INTERVAL HISTORY: "Dot" was evaluated in the multidisciplinary breast cancer clinic on 12/30/2017 accompanied by her daughter, [name]. Her case was also presented at the multidisciplinary breast cancer conference on the same day. At that time a preliminary plan was proposed: Breast conserving surgery with no sentinel lymph node sampling, but consideration of MRI prior to the surgery;  adjuvant radiation, consideration of adjuvant hormones, and genetics testing   REVIEW OF SYSTEMS: There were no specific symptoms leading to the original mammogram, which was routinely scheduled. She notes that she is having knee replacement surgery on 01/04/2018 under Dr. Ronnie Derby. She notes that before she had HTN, she had migraines. She notes that she took Premarin until about a week ago. She notes that her hot flashes are intense and her night sweat wake her up at night. The patient denies unusual headaches, visual changes, nausea, vomiting, stiff neck, dizziness, or gait imbalance. There has been no cough, phlegm production, or pleurisy, no chest pain or pressure, and no change in bowel or bladder habits. The patient denies fever, rash, bleeding, unexplained fatigue or unexplained weight loss. A detailed review of systems was otherwise entirely negative.   PAST MEDICAL HISTORY: Past Medical History:  Diagnosis Date  . Arthritis   . Cancer ([place])    HAD RIGHT BREAST BX --GOING TO CANCER CENTER ON WED 12/30/2017  . Constipation 08/13/2011  . Dizziness 08/13/2011  . GERD (gastroesophageal reflux disease)   . Heart murmur    "SLIGHT HEART MURMUR"  WAS PICKED UP AS AN ADULT  . Hyperlipidemia   . Hypertension   . Hypothyroidism   . Peripheral vascular disease ([place])   . Reflux   . Splenic artery aneurysm ([facility])    stable 1.5cm by 08/2013 CT; no further surveillance recommended  . Weight loss     PAST SURGICAL HISTORY: Past Surgical History:  Procedure Laterality Date  . ABDOMINAL HYSTERECTOMY    . BACK SURGERY    . BREAST SURGERY    . CHOLECYSTECTOMY    . KNEE SURGERY    . LUMBAR LAMINECTOMY    . PR VEIN BYPASS GRAFT,AORTO-FEM-POP     PT DENIES ANY TYPE OF SURGERY OF THIS KIND  .  TONSILLECTOMY    . VAGINAL PROLAPSE REPAIR     2015   DONE IN [place]  Bilateral cataract extraction, Gall bladder removal. Hysterectomy with USO and remaining ovary was later removed.  FAMILY  HISTORY Family History  Problem Relation Age of Onset  . Heart disease Mother   . Cancer Mother        Breast  . Heart disease Father   . Cancer Father   . Breast cancer Daughter   Her father passed from heart complications at age 48. Her mother also passed away from heart complications at age 58. She notes that her mother had breast cancer and was diagnosed in her 6's. Her daughter, [name] was diagnosed with breast cancer in her 56's. Her daughter was genetically testing in 2011. She denies a family history of ovarian cancer.  GYNECOLOGIC HISTORY:  No LMP recorded. Patient has had a hysterectomy. Menarche: [age] years old Age at first live birth: 77 years old GXP4 LMP: 1987 Contraceptive: oral pills with no complications HRT: Premarin since 1987 until January 2019 Hysterectomy with USO and the remaining ovary was later removed.    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 She is divorced, she shares her home and expenses with a friend, named [name].  The patient's son, [name], works in Insurance underwriter in [place], [place]. Her daughter, [name], is a Nutritional therapist at [employer].  The patient's daughter, [name], works in an office in [place].  Patient's son, [name], is a Customer service manager in [place], [place]. She has 4 grandchildren and 1 step grandchild.   ADVANCED DIRECTIVES: [name] [phone]   HEALTH MAINTENANCE: Social History   Tobacco Use  . Smoking status: Never Smoker  . Smokeless tobacco: Never Used  Substance Use Topics  . Alcohol use: No  . Drug use: No     Colonoscopy: [facility] due this year  PAP:   Bone density: March 2013 at [facility] T score -0.6 normal   Allergies  Allergen Reactions  . Penicillins Nausea And Vomiting and Rash    Current Outpatient Medications  Medication Sig Dispense Refill  . aspirin 81 MG tablet Take 81 mg by mouth daily.      . carvedilol (COREG) 12.5 MG tablet Take 12.5 mg by mouth 2 (two) times daily  with a meal.      . cholecalciferol (VITAMIN D) 1000 UNITS tablet Take 1,000 Units by mouth daily.    Marland Kitchen estrogens, conjugated, (PREMARIN) 0.3 MG tablet Take 0.3 mg by mouth every other day.     . levothyroxine (SYNTHROID, LEVOTHROID) 75 MCG tablet Take 75 mcg by mouth daily before breakfast.     . Multiple Vitamin (MULTIVITAMIN PO) Take 1 tablet by mouth daily.      . simvastatin (ZOCOR) 20 MG tablet Take 20 mg by mouth at bedtime.      Marland Kitchen telmisartan (MICARDIS) 20 MG tablet Take 20 mg by mouth daily.    Marland Kitchen gabapentin (NEURONTIN) 100 MG capsule Take 1 capsule (100 mg total) by mouth at bedtime. 90 capsule 4   No current facility-administered medications for this visit.     OBJECTIVE: Older white woman in no acute distress  Vitals:   12/30/17 0851  BP: (!) 160/74  Pulse: 83  Resp: 18  Temp: 97.7 F (36.5 C)  SpO2: 100%     Body mass index is 27.45 kg/m.   Wt Readings from Last 3 Encounters:  12/30/17 170 lb 1.6 oz (77.2 kg)  12/28/17 168 lb 11.2 oz (76.5 kg)  08/23/12 172 lb (78 kg)      ECOG FS:2 - Symptomatic, <50% confined to bed  Ocular: Sclerae unicteric, pupils round and equal Ear-nose-throat: Oropharynx clear and moist Lymphatic: No cervical or supraclavicular adenopathy Lungs no rales or rhonchi Heart regular rate and rhythm Abd soft, nontender, positive bowel sounds MSK no focal spinal tenderness, no joint edema Neuro: non-focal, well-oriented, appropriate affect Breasts: The right breast is status post recent biopsy.  There is a mild ecchymosis.  There is no palpable mass.  There are no skin or nipple changes of concern.  The left breast is benign.  Both axillae are benign.   LAB RESULTS:  CMP     Component Value Date/Time   NA 139 12/28/2017 1004   K 4.3 12/28/2017 1004   CL 105 12/28/2017 1004   CO2 22 12/28/2017 1004   GLUCOSE 113 (H) 12/28/2017 1004   BUN 14 12/28/2017 1004   CREATININE 0.93 12/28/2017 1004   CREATININE 0.94 08/26/2013 1710   CALCIUM  9.3 12/28/2017 1004   PROT 6.6 12/28/2017 1004   ALBUMIN 4.0 12/28/2017 1004   AST 23 12/28/2017 1004   ALT 18 12/28/2017 1004   ALKPHOS 59 12/28/2017 1004   BILITOT 1.3 (H) 12/28/2017 1004   GFRNONAA 59 (L) 12/28/2017 1004   GFRAA >60 12/28/2017 1004    No results found for: TOTALPROTELP, ALBUMINELP, A1GS, A2GS, BETS, BETA2SER, GAMS, MSPIKE, SPEI  No results found for: KPAFRELGTCHN, LAMBDASER, KAPLAMBRATIO  Lab Results  Component Value Date   WBC 6.6 12/28/2017   NEUTROABS 4.4 12/28/2017   HGB 12.3 12/28/2017   HCT 37.9 12/28/2017   MCV 94.0 12/28/2017   PLT 223 12/28/2017    '@LASTCHEMISTRY'$ @  No results found for: LABCA2  No components found for: MVHQIO962  No results for input(s): INR in the last 168 hours.  No results found for: LABCA2  No results found for: XBM841  No results found for: LKG401  No results found for: UUV253  No results found for: CA2729  No components found for: HGQUANT  No results found for: CEA1 / No results found for: CEA1   No results found for: AFPTUMOR  No results found for: [place]  No results found for: [facility] Outpatient Visit on 12/28/2017  Component Date Value Ref Range Status  . MRSA, PCR 12/28/2017 NEGATIVE  NEGATIVE Final  . Staphylococcus aureus 12/28/2017 POSITIVE* NEGATIVE Final   Comment: (NOTE) The Xpert SA Assay (FDA approved for NASAL specimens in patients 48 years of age and older), is one component of a comprehensive surveillance program. It is not intended to diagnose infection nor to guide or monitor treatment.   . WBC 12/28/2017 6.6  4.0 - 10.5 K/uL Final  . RBC 12/28/2017 4.03  3.87 - 5.11 MIL/uL Final  . Hemoglobin 12/28/2017 12.3  12.0 - 15.0 g/dL Final  . HCT 12/28/2017 37.9  36.0 - 46.0 % Final  . MCV 12/28/2017 94.0  78.0 - 100.0 fL Final  . MCH 12/28/2017 30.5  26.0 - 34.0 pg Final  . MCHC 12/28/2017 32.5  30.0 - 36.0 g/dL Final  . RDW 12/28/2017 13.8  11.5 - 15.5 % Final  . Platelets  12/28/2017 223  150 - 400 K/uL Final  . Neutrophils Relative % 12/28/2017 65  % Final  . Neutro Abs 12/28/2017 4.4  1.7 - 7.7 K/uL Final  . Lymphocytes Relative 12/28/2017 24  % Final  . Lymphs Abs 12/28/2017 1.6  0.7 - 4.0 K/uL Final  . Monocytes Relative 12/28/2017 8  % Final  . Monocytes Absolute 12/28/2017 0.5  0.1 - 1.0 K/uL Final  . Eosinophils Relative 12/28/2017 3  % Final  . Eosinophils Absolute 12/28/2017 0.2  0.0 - 0.7 K/uL Final  . Basophils Relative 12/28/2017 0  % Final  . Basophils Absolute 12/28/2017 0.0  0.0 - 0.1 K/uL Final  . Sodium 12/28/2017 139  135 - 145 mmol/L Final  . Potassium 12/28/2017 4.3  3.5 - 5.1 mmol/L Final  . Chloride 12/28/2017 105  101 - 111 mmol/L Final  . CO2 12/28/2017 22  22 - 32 mmol/L Final  . Glucose, Bld 12/28/2017 113* 65 - 99 mg/dL Final  . BUN 12/28/2017 14  6 - 20 mg/dL Final  . Creatinine, Ser 12/28/2017 0.93  0.44 - 1.00 mg/dL Final  . Calcium 12/28/2017 9.3  8.9 - 10.3 mg/dL Final  . Total Protein 12/28/2017 6.6  6.5 - 8.1 g/dL Final  . Albumin 12/28/2017 4.0  3.5 - 5.0 g/dL Final  . AST 12/28/2017 23  15 - 41 U/L Final  . ALT 12/28/2017 18  14 - 54 U/L Final  . Alkaline Phosphatase 12/28/2017 59  38 - 126 U/L Final  . Total Bilirubin 12/28/2017 1.3* 0.3 - 1.2 mg/dL Final  . GFR calc non Af Amer 12/28/2017 59* >60 mL/min Final  . GFR calc Af Amer 12/28/2017 >60  >60 mL/min Final   Comment: (NOTE) The eGFR has been calculated using the CKD EPI equation. This calculation has not been validated in all clinical situations. eGFR's persistently <60 mL/min signify possible Chronic Kidney Disease.   . Anion gap 12/28/2017 12  5 - 15 Final    (this displays the last labs from the last 3 days)  No results found for: TOTALPROTELP, ALBUMINELP, A1GS, A2GS, BETS, BETA2SER, GAMS, MSPIKE, SPEI (this displays SPEP labs)  No results found for: KPAFRELGTCHN, LAMBDASER, KAPLAMBRATIO (kappa/lambda light chains)  No results found for: HGBA,  HGBA2QUANT, HGBFQUANT, HGBSQUAN (Hemoglobinopathy evaluation)   No results found for: LDH  No results found for: IRON, TIBC, IRONPCTSAT (Iron and TIBC)  No results found for: FERRITIN  Urinalysis No results found for: COLORURINE, APPEARANCEUR, LABSPEC, PHURINE, GLUCOSEU, HGBUR, BILIRUBINUR, KETONESUR, PROTEINUR, UROBILINOGEN, NITRITE, LEUKOCYTESUR   STUDIES: Outside studies reviewed with the patient  ELIGIBLE FOR AVAILABLE RESEARCH PROTOCOL: no  ASSESSMENT: 77 y.o. [place], [place] Woman status post right breast lower outer quadrant biopsy on 12/21/2017 for a ductal carcinoma in situ, high-grade, estrogen and progesterone receptor positive  (1) genetics testing pending  (2) breast conserving surgery without sentinel lymph node  (3) adjuvant radiation  (4) consider adjuvant hormone therapy  PLAN: We spent the better part of today's hour-long appointment discussing the biology of her diagnosis and the specifics of her situation. Myrical understands that in noninvasive ductal carcinoma, also called ductal carcinoma in situ ("DCIS") the breast cancer cells remain trapped in the ducts were they started. They cannot travel to a vital organ. For that reason these cancers in themselves are not life-threatening.  If the whole breast is removed then all the ducts are removed and since the cancer cells are trapped in the ducts, the cure rate with mastectomy for noninvasive breast cancer is approximately 99%. Nevertheless we recommend lumpectomy, because there is no survival advantage to mastectomy and because the cosmetic result is generally superior with breast conservation.  Since the patient is keeping her breast, there will be some risk of recurrence. The recurrence  can only be in the same breast since, again, the cells are trapped in the ducts. There is no connection from one breast to the other. The risk of local recurrence is cut by more than half with radiation, which is standard in this  situation.  In estrogen receptor positive cancers like Dot's, anti-estrogens can also be considered. They will further reduce the risk of recurrence by one half. In addition anti-estrogens will lower the risk of a new breast cancer developing in either breast, also by one half. That risk approaches 1% per year. Anti-estrogens reduce it to 1/2%.  Accordingly the overall plan is for surgery, followed by radiation, then a discussion of anti-estrogens.  Dot but does qualify for genetics testing. In patients who carry a deleterious mutation [for example in a  BRCA gene], the risk of a new breast cancer developing in the future may be sufficiently great that the patient may choose bilateral mastectomies. However if she wishes to keep her breasts in that situation it is safe to do so. That would require intensified screening, which generally means not only yearly mammography but a yearly breast MRI as well. Of course, if there is a deleterious mutation bilateral oophorectomy would be necessary as there is no standard screening protocol for ovarian cancer.  Xylia has a good understanding of the overall plan. She agrees with it. She knows the goal of treatment in her case is cure. She will call with any problems that may develop before her next visit here.   Magrinat, Virgie Dad, MD  12/30/17 11:36 AM Medical Oncology and Hematology [facility] [address] [place],  [address] Tel. [phone]    Fax. [phone]  This document serves as a record of services personally performed by Lurline Del, MD. It was created on his behalf by Sheron Nightingale, a trained medical scribe. The creation of this record is based on the scribe's personal observations and the provider's statements to them.   I have reviewed the above documentation for accuracy and completeness, and I agree with the above.

## 2017-12-31 ENCOUNTER — Inpatient Hospital Stay (HOSPITAL_BASED_OUTPATIENT_CLINIC_OR_DEPARTMENT_OTHER): Payer: Federal, State, Local not specified - PPO | Admitting: Genetic Counselor

## 2017-12-31 ENCOUNTER — Inpatient Hospital Stay: Payer: Federal, State, Local not specified - PPO

## 2017-12-31 ENCOUNTER — Encounter: Payer: Self-pay | Admitting: Genetic Counselor

## 2017-12-31 DIAGNOSIS — R7301 Impaired fasting glucose: Secondary | ICD-10-CM | POA: Diagnosis not present

## 2017-12-31 DIAGNOSIS — Z803 Family history of malignant neoplasm of breast: Secondary | ICD-10-CM | POA: Diagnosis not present

## 2017-12-31 DIAGNOSIS — D0511 Intraductal carcinoma in situ of right breast: Secondary | ICD-10-CM

## 2017-12-31 DIAGNOSIS — I10 Essential (primary) hypertension: Secondary | ICD-10-CM | POA: Diagnosis not present

## 2017-12-31 DIAGNOSIS — E039 Hypothyroidism, unspecified: Secondary | ICD-10-CM | POA: Diagnosis not present

## 2017-12-31 DIAGNOSIS — E782 Mixed hyperlipidemia: Secondary | ICD-10-CM | POA: Diagnosis not present

## 2017-12-31 NOTE — Progress Notes (Signed)
[facility]      Initial Visit   Patient Name: Crystal Trevino Patient DOB: 02/01/1941 Patient Age: 77 y.o. Encounter Date: 12/31/2017  Referring Provider: Lurline Del, MD  Primary Care Provider: Cari Caraway, MD  Reason for Visit: Evaluate for hereditary susceptibility to cancer    Assessment and Plan:  . Crystal Trevino personal history of breast cancer at age 14 is not suggestive of a hereditary predisposition to cancer even though she has a family history of breast cancer. She does meets NCCN criteria for genetic testing.  . Testing is recommended to determine whether she has a pathogenic mutation that will impact her screening and risk-reduction for future cancer. A negative result will be reassuring.  . Crystal Trevino wished to pursue genetic testing and a blood sample will be sent for analysis of the 83 genes on [facility]'s Multi-Cancer panel (ALK, APC, ATM, AXIN2, BAP1, BARD1, BLM, BMPR1A, BRCA1, BRCA2, BRIP1, CASR, CDC73, CDH1, CDK4, CDKN1B, CDKN1C, CDKN2A, CEBPA, CHEK2, CTNNA1, DICER1, DIS3L2, EGFR, EPCAM, FH, FLCN, GATA2, GPC3, GREM1, HOXB13, HRAS, KIT, MAX, MEN1, MET, MITF, MLH1, MSH2, MSH3, MSH6, MUTYH, NBN, NF1, NF2, NTHL1, PALB2, PDGFRA, PHOX2B, PMS2, POLD1, POLE, POT1, PRKAR1A, PTCH1, PTEN, RAD50, RAD51C, RAD51D, RB1, RECQL4, RET, RUNX1, SDHA, SDHAF2, SDHB, SDHC, SDHD, SMAD4, SMARCA4, SMARCB1, SMARCE1, STK11, SUFU, TERC, TERT, TMEM127, TP53, TSC1, TSC2, VHL, WRN, WT1).   . Results should be available in approximately 2-4 weeks, at which point we will contact her and address implications for her as well as address genetic testing for at-risk family members, if needed.     Dr. Jana Hakim was available for questions concerning this case. Total time spent by me in face-to-face counseling was approximately 30 minutes.   _____________________________________________________________________   History of Present Illness: Crystal Trevino, a 77 y.o.  female, is being seen at the [facility] due to a personal and family history of breast cancer. She presents to clinic today to discuss the possibility of a hereditary predisposition to cancer and discuss whether genetic testing is warranted.  Crystal Trevino was recently diagnosed with breast cancer (DCIS) at the age of 30. The breast tumor was ER positive and PR positive. She stated that she has opted to proceed with lumpectomy followed by radiation and will get surgery scheduled soon.      Past Medical History:  Diagnosis Date  . Arthritis   . Breast CA ([place])   . Cancer ([place])    HAD RIGHT BREAST BX --GOING TO [facility]ER [facility] ON WED 12/30/2017  . Constipation 08/13/2011  . Dizziness 08/13/2011  . Family history of breast cancer   . GERD (gastroesophageal reflux disease)   . Heart murmur    "SLIGHT HEART MURMUR"  WAS PICKED UP AS AN ADULT  . Hyperlipidemia   . Hypertension   . Hypothyroidism   . Peripheral vascular disease ([place])   . Reflux   . Splenic artery aneurysm ([facility])    stable 1.5cm by 08/2013 CT; no further surveillance recommended  . Weight loss     Past Surgical History:  Procedure Laterality Date  . ABDOMINAL HYSTERECTOMY    . BACK SURGERY    . BREAST SURGERY    . CHOLECYSTECTOMY    . KNEE SURGERY    . LUMBAR LAMINECTOMY    . PR VEIN BYPASS GRAFT,AORTO-FEM-POP     PT DENIES ANY TYPE OF SURGERY OF THIS KIND  . TONSILLECTOMY    . VAGINAL PROLAPSE  REPAIR     2015   DONE IN [place]    Social History   Socioeconomic History  . Marital status: Legally Separated    Spouse name: Not on file  . Number of children: Not on file  . Years of education: Not on file  . Highest education level: Not on file  Social Needs  . Financial resource strain: Not on file  . Food insecurity - worry: Not on file  . Food insecurity - inability: Not on file  . Transportation needs - medical: Not on file  . Transportation needs - non-medical: Not on file    Occupational History  . Not on file  Tobacco Use  . Smoking status: Never Smoker  . Smokeless tobacco: Never Used  Substance and Sexual Activity  . Alcohol use: No  . Drug use: No  . Sexual activity: Not on file  Other Topics Concern  . Not on file  Social History Narrative  . Not on file     Family History:  During the visit, a 4-generation pedigree was obtained. Family tree will be scanned in the Media tab in Epic  Significant diagnoses include the following:  Family History  Problem Relation Age of Onset  . Heart disease Mother   . Breast cancer Mother 52       deceased 36; TAH/BSO (age?)  . Heart disease Father   . Breast cancer Daughter        dx 31s; currenlty 39; Neg BRCA1/BRCA2  . Breast cancer Maternal Aunt 44       deceased 79    Additionally, Crystal Trevino has 2 sons and 2 daughters. She has one sister (age 38) who has no children. Her mother (noted above) had 3 sisters and 2 brothers. Her father died at 78; cancer-free. He had 2 sisters. There are no cancers reported in her grandparents.  Crystal Trevino ancestry is Caucasian - NOS. There is no known Jewish ancestry and no consanguinity.  Discussion: We reviewed the characteristics, features and inheritance patterns of hereditary cancer syndromes. We discussed her risk of harboring a mutation in the context of her personal and family history. We discussed the process of genetic testing, insurance coverage and implications of results: positive, negative and variant of unknown significance (VUS).    Crystal Trevino questions were answered to her satisfaction today and she is welcome to call with any additional questions or concerns. Thank you for the referral and allowing [place] to share in the care of your patient.    Steele Berg, MS, [place] Certified Genetic Counselor phone: [phone] [email or web address]

## 2018-01-01 ENCOUNTER — Ambulatory Visit (HOSPITAL_COMMUNITY)
Admission: RE | Admit: 2018-01-01 | Discharge: 2018-01-01 | Disposition: A | Discharge status: Home / Self Care | Payer: Federal, State, Local not specified - PPO | Source: Ambulatory Visit | Attending: Surgery | Admitting: Surgery

## 2018-01-01 DIAGNOSIS — R590 Localized enlarged lymph nodes: Secondary | ICD-10-CM | POA: Diagnosis not present

## 2018-01-01 DIAGNOSIS — D0511 Intraductal carcinoma in situ of right breast: Secondary | ICD-10-CM | POA: Insufficient documentation

## 2018-01-01 DIAGNOSIS — R921 Mammographic calcification found on diagnostic imaging of breast: Secondary | ICD-10-CM | POA: Diagnosis not present

## 2018-01-01 MED ORDER — ACETAMINOPHEN 500 MG PO TABS
1000.0000 mg | ORAL_TABLET | Freq: Once | ORAL | Status: AC
  Administered 2018-01-04: 1000 mg via ORAL
  Filled 2018-01-01: qty 2

## 2018-01-01 MED ORDER — CLINDAMYCIN PHOSPHATE 900 MG/50ML IV SOLN
900.0000 mg | INTRAVENOUS | Status: AC
  Administered 2018-01-04: 900 mg via INTRAVENOUS
  Filled 2018-01-01: qty 50

## 2018-01-01 MED ORDER — DEXAMETHASONE SODIUM PHOSPHATE 10 MG/ML IJ SOLN
8.0000 mg | Freq: Once | INTRAMUSCULAR | Status: AC
  Administered 2018-01-04: 8 mg via INTRAVENOUS
  Filled 2018-01-01: qty 1

## 2018-01-01 MED ORDER — BUPIVACAINE LIPOSOME 1.3 % IJ SUSP
20.0000 mL | INTRAMUSCULAR | Status: AC
  Administered 2018-01-04: 20 mL
  Filled 2018-01-01: qty 20

## 2018-01-01 MED ORDER — GABAPENTIN 300 MG PO CAPS
300.0000 mg | ORAL_CAPSULE | Freq: Once | ORAL | Status: AC
  Administered 2018-01-04: 300 mg via ORAL
  Filled 2018-01-01: qty 1

## 2018-01-01 MED ORDER — TRANEXAMIC ACID 1000 MG/10ML IV SOLN
1000.0000 mg | INTRAVENOUS | Status: AC
  Administered 2018-01-04: 1000 mg via INTRAVENOUS
  Filled 2018-01-01: qty 1100

## 2018-01-01 MED ORDER — GADOBENATE DIMEGLUMINE 529 MG/ML IV SOLN
20.0000 mL | Freq: Once | INTRAVENOUS | Status: AC | PRN
  Administered 2018-01-01: 16 mL via INTRAVENOUS

## 2018-01-04 ENCOUNTER — Ambulatory Visit (HOSPITAL_COMMUNITY): Payer: Federal, State, Local not specified - PPO | Admitting: Emergency Medicine

## 2018-01-04 ENCOUNTER — Encounter (HOSPITAL_COMMUNITY)
Admission: RE | Disposition: A | Discharge status: Home / Self Care | Payer: Self-pay | Source: Ambulatory Visit | Attending: Orthopedic Surgery

## 2018-01-04 ENCOUNTER — Ambulatory Visit (HOSPITAL_COMMUNITY): Payer: Federal, State, Local not specified - PPO | Admitting: Anesthesiology

## 2018-01-04 ENCOUNTER — Other Ambulatory Visit: Payer: Self-pay

## 2018-01-04 ENCOUNTER — Encounter (HOSPITAL_COMMUNITY): Payer: Self-pay

## 2018-01-04 ENCOUNTER — Observation Stay (HOSPITAL_COMMUNITY)
Admission: RE | Admit: 2018-01-04 | Discharge: 2018-01-05 | Disposition: A | Discharge status: Home / Self Care | Payer: Federal, State, Local not specified - PPO | Source: Ambulatory Visit | Attending: Orthopedic Surgery | Admitting: Orthopedic Surgery

## 2018-01-04 DIAGNOSIS — Z803 Family history of malignant neoplasm of breast: Secondary | ICD-10-CM | POA: Insufficient documentation

## 2018-01-04 DIAGNOSIS — Z79899 Other long term (current) drug therapy: Secondary | ICD-10-CM | POA: Diagnosis not present

## 2018-01-04 DIAGNOSIS — Z7982 Long term (current) use of aspirin: Secondary | ICD-10-CM | POA: Diagnosis not present

## 2018-01-04 DIAGNOSIS — M1711 Unilateral primary osteoarthritis, right knee: Secondary | ICD-10-CM | POA: Diagnosis not present

## 2018-01-04 DIAGNOSIS — Z880 Allergy status to penicillin: Secondary | ICD-10-CM | POA: Insufficient documentation

## 2018-01-04 DIAGNOSIS — Z853 Personal history of malignant neoplasm of breast: Secondary | ICD-10-CM | POA: Diagnosis not present

## 2018-01-04 DIAGNOSIS — I10 Essential (primary) hypertension: Secondary | ICD-10-CM | POA: Diagnosis not present

## 2018-01-04 DIAGNOSIS — D0511 Intraductal carcinoma in situ of right breast: Secondary | ICD-10-CM | POA: Diagnosis not present

## 2018-01-04 DIAGNOSIS — G8918 Other acute postprocedural pain: Secondary | ICD-10-CM | POA: Diagnosis not present

## 2018-01-04 DIAGNOSIS — Z96659 Presence of unspecified artificial knee joint: Secondary | ICD-10-CM

## 2018-01-04 HISTORY — PX: TOTAL KNEE ARTHROPLASTY: SHX125

## 2018-01-04 HISTORY — DX: Unilateral primary osteoarthritis, unspecified knee: M17.10

## 2018-01-04 HISTORY — DX: Osteoarthritis of knee, unspecified: M17.9

## 2018-01-04 SURGERY — ARTHROPLASTY, KNEE, TOTAL
Anesthesia: Spinal | Site: Knee | Laterality: Right

## 2018-01-04 MED ORDER — PROPOFOL 10 MG/ML IV BOLUS
INTRAVENOUS | Status: DC | PRN
  Administered 2018-01-04: 20 mg via INTRAVENOUS
  Administered 2018-01-04: 30 mg via INTRAVENOUS

## 2018-01-04 MED ORDER — LACTATED RINGERS IV SOLN
INTRAVENOUS | Status: DC | PRN
  Administered 2018-01-04 (×2): via INTRAVENOUS

## 2018-01-04 MED ORDER — ONDANSETRON HCL 4 MG/2ML IJ SOLN: 4.0000 mg | Freq: Once | INTRAMUSCULAR | Status: DC | PRN

## 2018-01-04 MED ORDER — CHLORHEXIDINE GLUCONATE 4 % EX LIQD: 60.0000 mL | Freq: Once | CUTANEOUS | Status: DC

## 2018-01-04 MED ORDER — SODIUM CHLORIDE 0.9 % IV SOLN
1000.0000 mg | Freq: Once | INTRAVENOUS | Status: AC
  Administered 2018-01-04: 1000 mg via INTRAVENOUS
  Filled 2018-01-04: qty 10

## 2018-01-04 MED ORDER — METOCLOPRAMIDE HCL 5 MG/ML IJ SOLN: 5.0000 mg | Freq: Three times a day (TID) | INTRAMUSCULAR | Status: DC | PRN

## 2018-01-04 MED ORDER — OXYCODONE HCL 5 MG PO TABS
5.0000 mg | ORAL_TABLET | ORAL | Status: DC | PRN
  Administered 2018-01-04: 5 mg via ORAL
  Filled 2018-01-04: qty 1

## 2018-01-04 MED ORDER — DOCUSATE SODIUM 100 MG PO CAPS
100.0000 mg | ORAL_CAPSULE | Freq: Two times a day (BID) | ORAL | Status: DC
  Administered 2018-01-04 – 2018-01-05 (×2): 100 mg via ORAL
  Filled 2018-01-04 (×2): qty 1

## 2018-01-04 MED ORDER — SENNOSIDES-DOCUSATE SODIUM 8.6-50 MG PO TABS: 1.0000 | ORAL_TABLET | Freq: Every evening | ORAL | Status: DC | PRN

## 2018-01-04 MED ORDER — PROPOFOL 10 MG/ML IV BOLUS
INTRAVENOUS | Status: AC
  Filled 2018-01-04: qty 20

## 2018-01-04 MED ORDER — ASPIRIN EC 325 MG PO TBEC
325.0000 mg | DELAYED_RELEASE_TABLET | Freq: Two times a day (BID) | ORAL | Status: DC
  Administered 2018-01-04 – 2018-01-05 (×2): 325 mg via ORAL
  Filled 2018-01-04 (×2): qty 1

## 2018-01-04 MED ORDER — ONDANSETRON HCL 4 MG PO TABS: 4.0000 mg | ORAL_TABLET | Freq: Four times a day (QID) | ORAL | Status: DC | PRN

## 2018-01-04 MED ORDER — SODIUM CHLORIDE 0.9 % IJ SOLN
INTRAMUSCULAR | Status: DC | PRN
  Administered 2018-01-04: 20 mL

## 2018-01-04 MED ORDER — ALUM & MAG HYDROXIDE-SIMETH 200-200-20 MG/5ML PO SUSP: 30.0000 mL | ORAL | Status: DC | PRN

## 2018-01-04 MED ORDER — OXYCODONE HCL 5 MG/5ML PO SOLN: 5.0000 mg | Freq: Once | ORAL | Status: DC | PRN

## 2018-01-04 MED ORDER — METOCLOPRAMIDE HCL 5 MG PO TABS: 5.0000 mg | ORAL_TABLET | Freq: Three times a day (TID) | ORAL | Status: DC | PRN

## 2018-01-04 MED ORDER — CELECOXIB 200 MG PO CAPS
200.0000 mg | ORAL_CAPSULE | Freq: Two times a day (BID) | ORAL | Status: DC
  Administered 2018-01-04 – 2018-01-05 (×2): 200 mg via ORAL
  Filled 2018-01-04 (×2): qty 1

## 2018-01-04 MED ORDER — SODIUM CHLORIDE 0.9 % IR SOLN
Status: DC | PRN
  Administered 2018-01-04: 1000 mL

## 2018-01-04 MED ORDER — HYDROMORPHONE HCL 1 MG/ML IJ SOLN: 0.2500 mg | INTRAMUSCULAR | Status: DC | PRN

## 2018-01-04 MED ORDER — 0.9 % SODIUM CHLORIDE (POUR BTL) OPTIME
TOPICAL | Status: DC | PRN
  Administered 2018-01-04: 1000 mL

## 2018-01-04 MED ORDER — PHENYLEPHRINE 40 MCG/ML (10ML) SYRINGE FOR IV PUSH (FOR BLOOD PRESSURE SUPPORT)
PREFILLED_SYRINGE | INTRAVENOUS | Status: DC | PRN
  Administered 2018-01-04: 60 ug via INTRAVENOUS

## 2018-01-04 MED ORDER — CHLORHEXIDINE GLUCONATE CLOTH 2 % EX PADS: 6.0000 | MEDICATED_PAD | Freq: Once | CUTANEOUS | Status: DC

## 2018-01-04 MED ORDER — EPINEPHRINE PF 1 MG/ML IJ SOLN
INTRAMUSCULAR | Status: DC | PRN
  Administered 2018-01-04: .15 mL

## 2018-01-04 MED ORDER — BUPIVACAINE-EPINEPHRINE (PF) 0.5% -1:200000 IJ SOLN
INTRAMUSCULAR | Status: AC
  Filled 2018-01-04: qty 30

## 2018-01-04 MED ORDER — DEXAMETHASONE SODIUM PHOSPHATE 10 MG/ML IJ SOLN
10.0000 mg | Freq: Once | INTRAMUSCULAR | Status: AC
  Administered 2018-01-05: 10 mg via INTRAVENOUS
  Filled 2018-01-04: qty 1

## 2018-01-04 MED ORDER — ONDANSETRON HCL 4 MG/2ML IJ SOLN
INTRAMUSCULAR | Status: DC | PRN
  Administered 2018-01-04: 4 mg via INTRAVENOUS

## 2018-01-04 MED ORDER — IRBESARTAN 150 MG PO TABS
75.0000 mg | ORAL_TABLET | Freq: Every day | ORAL | Status: DC
  Administered 2018-01-04 – 2018-01-05 (×2): 75 mg via ORAL
  Filled 2018-01-04 (×2): qty 1

## 2018-01-04 MED ORDER — ACETAMINOPHEN 325 MG PO TABS: 650.0000 mg | ORAL_TABLET | ORAL | Status: DC | PRN

## 2018-01-04 MED ORDER — DIPHENHYDRAMINE HCL 12.5 MG/5ML PO ELIX: 12.5000 mg | ORAL_SOLUTION | ORAL | Status: DC | PRN

## 2018-01-04 MED ORDER — OXYCODONE HCL 5 MG PO TABS: 5.0000 mg | ORAL_TABLET | Freq: Once | ORAL | Status: DC | PRN

## 2018-01-04 MED ORDER — EPINEPHRINE PF 1 MG/ML IJ SOLN
INTRAMUSCULAR | Status: AC
  Filled 2018-01-04: qty 2

## 2018-01-04 MED ORDER — CARVEDILOL 12.5 MG PO TABS
12.5000 mg | ORAL_TABLET | Freq: Two times a day (BID) | ORAL | Status: DC
  Administered 2018-01-04 – 2018-01-05 (×2): 12.5 mg via ORAL
  Filled 2018-01-04 (×2): qty 1

## 2018-01-04 MED ORDER — FENTANYL CITRATE (PF) 250 MCG/5ML IJ SOLN
INTRAMUSCULAR | Status: AC
  Filled 2018-01-04: qty 5

## 2018-01-04 MED ORDER — METHOCARBAMOL 500 MG PO TABS: 500.0000 mg | ORAL_TABLET | Freq: Four times a day (QID) | ORAL | Status: DC | PRN

## 2018-01-04 MED ORDER — ACETAMINOPHEN 650 MG RE SUPP: 650.0000 mg | RECTAL | Status: DC | PRN

## 2018-01-04 MED ORDER — HYDROCODONE-ACETAMINOPHEN 7.5-325 MG PO TABS
1.0000 | ORAL_TABLET | Freq: Four times a day (QID) | ORAL | Status: DC
  Administered 2018-01-04 – 2018-01-05 (×4): 1 via ORAL
  Filled 2018-01-04 (×4): qty 1

## 2018-01-04 MED ORDER — PHENYLEPHRINE HCL 10 MG/ML IJ SOLN
INTRAVENOUS | Status: DC | PRN
  Administered 2018-01-04: 20 ug/min via INTRAVENOUS

## 2018-01-04 MED ORDER — MENTHOL 3 MG MT LOZG: 1.0000 | LOZENGE | OROMUCOSAL | Status: DC | PRN

## 2018-01-04 MED ORDER — BUPIVACAINE HCL 0.25 % IJ SOLN
INTRAMUSCULAR | Status: DC | PRN
  Administered 2018-01-04: 30 mL

## 2018-01-04 MED ORDER — BISACODYL 5 MG PO TBEC: 5.0000 mg | DELAYED_RELEASE_TABLET | Freq: Every day | ORAL | Status: DC | PRN

## 2018-01-04 MED ORDER — GABAPENTIN 100 MG PO CAPS
100.0000 mg | ORAL_CAPSULE | Freq: Every day | ORAL | Status: DC
  Administered 2018-01-04: 100 mg via ORAL
  Filled 2018-01-04: qty 1

## 2018-01-04 MED ORDER — METHOCARBAMOL 1000 MG/10ML IJ SOLN
500.0000 mg | Freq: Four times a day (QID) | INTRAVENOUS | Status: DC | PRN
  Filled 2018-01-04: qty 5

## 2018-01-04 MED ORDER — BUPIVACAINE HCL (PF) 0.25 % IJ SOLN
INTRAMUSCULAR | Status: AC
  Filled 2018-01-04: qty 30

## 2018-01-04 MED ORDER — PHENOL 1.4 % MT LIQD: 1.0000 | OROMUCOSAL | Status: DC | PRN

## 2018-01-04 MED ORDER — SIMVASTATIN 20 MG PO TABS
20.0000 mg | ORAL_TABLET | Freq: Every day | ORAL | Status: DC
  Administered 2018-01-04: 20 mg via ORAL
  Filled 2018-01-04: qty 1

## 2018-01-04 MED ORDER — ONDANSETRON HCL 4 MG/2ML IJ SOLN: 4.0000 mg | Freq: Four times a day (QID) | INTRAMUSCULAR | Status: DC | PRN

## 2018-01-04 MED ORDER — LEVOTHYROXINE SODIUM 75 MCG PO TABS
75.0000 ug | ORAL_TABLET | Freq: Every day | ORAL | Status: DC
  Administered 2018-01-05: 75 ug via ORAL
  Filled 2018-01-04: qty 1

## 2018-01-04 MED ORDER — MIDAZOLAM HCL 2 MG/2ML IJ SOLN
INTRAMUSCULAR | Status: AC
  Filled 2018-01-04: qty 2

## 2018-01-04 MED ORDER — PROPOFOL 500 MG/50ML IV EMUL
INTRAVENOUS | Status: DC | PRN
  Administered 2018-01-04: 75 ug/kg/min via INTRAVENOUS

## 2018-01-04 MED ORDER — FLEET ENEMA 7-19 GM/118ML RE ENEM: 1.0000 | ENEMA | Freq: Once | RECTAL | Status: DC | PRN

## 2018-01-04 MED ORDER — HYDROMORPHONE HCL 1 MG/ML IJ SOLN: 1.0000 mg | INTRAMUSCULAR | Status: DC | PRN

## 2018-01-04 MED ORDER — ONDANSETRON HCL 4 MG/2ML IJ SOLN
INTRAMUSCULAR | Status: AC
  Filled 2018-01-04: qty 2

## 2018-01-04 MED ORDER — ZOLPIDEM TARTRATE 5 MG PO TABS: 5.0000 mg | ORAL_TABLET | Freq: Every evening | ORAL | Status: DC | PRN

## 2018-01-04 MED ORDER — BUPIVACAINE IN DEXTROSE 0.75-8.25 % IT SOLN
INTRATHECAL | Status: DC | PRN
  Administered 2018-01-04: 1.9 mL via INTRATHECAL

## 2018-01-04 MED ORDER — FENTANYL CITRATE (PF) 250 MCG/5ML IJ SOLN
INTRAMUSCULAR | Status: DC | PRN
  Administered 2018-01-04 (×2): 50 ug via INTRAVENOUS

## 2018-01-04 MED ORDER — CLINDAMYCIN PHOSPHATE 600 MG/50ML IV SOLN
600.0000 mg | Freq: Four times a day (QID) | INTRAVENOUS | Status: AC
  Administered 2018-01-04 (×2): 600 mg via INTRAVENOUS
  Filled 2018-01-04 (×2): qty 50

## 2018-01-04 SURGICAL SUPPLY — 61 items
BANDAGE ACE 6X5 VEL STRL LF (GAUZE/BANDAGES/DRESSINGS) ×2 IMPLANT
BANDAGE ESMARK 6X9 LF (GAUZE/BANDAGES/DRESSINGS) ×1 IMPLANT
BLADE SAGITTAL 13X1.27X60 (BLADE) ×2 IMPLANT
BLADE SAW SGTL 83.5X18.5 (BLADE) ×2 IMPLANT
BLADE SURG 10 STRL SS (BLADE) ×2 IMPLANT
BNDG ESMARK 6X9 LF (GAUZE/BANDAGES/DRESSINGS) ×2
BOWL SMART MIX CTS (DISPOSABLE) ×2 IMPLANT
CAPT KNEE TOTAL 3 ×2 IMPLANT
CEMENT BONE SIMPLEX SPEEDSET (Cement) ×4 IMPLANT
COVER SURGICAL LIGHT HANDLE (MISCELLANEOUS) ×2 IMPLANT
CUFF TOURNIQUET SINGLE 34IN LL (TOURNIQUET CUFF) ×2 IMPLANT
DRAPE EXTREMITY T 121X128X90 (DRAPE) ×2 IMPLANT
DRAPE HALF SHEET 40X57 (DRAPES) ×2 IMPLANT
DRAPE INCISE IOBAN 66X45 STRL (DRAPES) ×4 IMPLANT
DRAPE U-SHAPE 47X51 STRL (DRAPES) ×2 IMPLANT
DRAPE UNIVERSAL PACK (DRAPES) ×2 IMPLANT
DRSG AQUACEL AG ADV 3.5X10 (GAUZE/BANDAGES/DRESSINGS) ×2 IMPLANT
DURAPREP 26ML APPLICATOR (WOUND CARE) ×4 IMPLANT
ELECT REM PT RETURN 9FT ADLT (ELECTROSURGICAL) ×2
ELECTRODE REM PT RTRN 9FT ADLT (ELECTROSURGICAL) ×1 IMPLANT
GLOVE BIOGEL M 7.0 STRL (GLOVE) IMPLANT
GLOVE BIOGEL PI IND STRL 7.0 (GLOVE) ×1 IMPLANT
GLOVE BIOGEL PI IND STRL 7.5 (GLOVE) IMPLANT
GLOVE BIOGEL PI IND STRL 8.5 (GLOVE) ×2 IMPLANT
GLOVE BIOGEL PI INDICATOR 7.0 (GLOVE) ×1
GLOVE BIOGEL PI INDICATOR 7.5 (GLOVE)
GLOVE BIOGEL PI INDICATOR 8.5 (GLOVE) ×2
GLOVE SURG ORTHO 8.0 STRL STRW (GLOVE) ×6 IMPLANT
GOWN STRL REUS W/ TWL LRG LVL3 (GOWN DISPOSABLE) ×1 IMPLANT
GOWN STRL REUS W/ TWL XL LVL3 (GOWN DISPOSABLE) ×1 IMPLANT
GOWN STRL REUS W/TWL 2XL LVL3 (GOWN DISPOSABLE) ×2 IMPLANT
GOWN STRL REUS W/TWL LRG LVL3 (GOWN DISPOSABLE) ×1
GOWN STRL REUS W/TWL XL LVL3 (GOWN DISPOSABLE) ×1
HANDPIECE INTERPULSE COAX TIP (DISPOSABLE) ×1
HOOD PEEL AWAY FACE SHEILD DIS (HOOD) ×6 IMPLANT
KIT BASIN OR (CUSTOM PROCEDURE TRAY) ×2 IMPLANT
KIT ROOM TURNOVER OR (KITS) ×2 IMPLANT
KNEE CAPITATED TOTAL 3 ×1 IMPLANT
MANIFOLD NEPTUNE II (INSTRUMENTS) ×2 IMPLANT
NEEDLE 18GX1X1/2 (RX/OR ONLY) (NEEDLE) IMPLANT
NEEDLE 22X1 1/2 (OR ONLY) (NEEDLE) ×2 IMPLANT
NS IRRIG 1000ML POUR BTL (IV SOLUTION) ×2 IMPLANT
PACK TOTAL JOINT (CUSTOM PROCEDURE TRAY) ×2 IMPLANT
PAD ARMBOARD 7.5X6 YLW CONV (MISCELLANEOUS) ×4 IMPLANT
SET HNDPC FAN SPRY TIP SCT (DISPOSABLE) ×1 IMPLANT
STRIP CLOSURE SKIN 1/2X4 (GAUZE/BANDAGES/DRESSINGS) ×2 IMPLANT
SUCTION FRAZIER HANDLE 10FR (MISCELLANEOUS) ×1
SUCTION TUBE FRAZIER 10FR DISP (MISCELLANEOUS) ×1 IMPLANT
SUT BONE WAX W31G (SUTURE) ×2 IMPLANT
SUT MNCRL AB 3-0 PS2 18 (SUTURE) ×2 IMPLANT
SUT VIC AB 0 CTB1 27 (SUTURE) ×2 IMPLANT
SUT VIC AB 1 CT1 27 (SUTURE) ×2
SUT VIC AB 1 CT1 27XBRD ANBCTR (SUTURE) ×2 IMPLANT
SUT VIC AB 2-0 CT1 27 (SUTURE) ×2
SUT VIC AB 2-0 CT1 TAPERPNT 27 (SUTURE) ×2 IMPLANT
SUT VLOC 180 0 24IN GS25 (SUTURE) ×2 IMPLANT
SYR 20CC LL (SYRINGE) ×4 IMPLANT
TOWEL OR 17X24 6PK STRL BLUE (TOWEL DISPOSABLE) ×2 IMPLANT
TOWEL OR 17X26 10 PK STRL BLUE (TOWEL DISPOSABLE) ×2 IMPLANT
TRAY CATH 16FR W/PLASTIC CATH (SET/KITS/TRAYS/PACK) IMPLANT
WRAP KNEE MAXI GEL POST OP (GAUZE/BANDAGES/DRESSINGS) ×2 IMPLANT

## 2018-01-04 NOTE — Anesthesia Postprocedure Evaluation (Signed)
Anesthesia Post Note  Patient: Crystal Trevino  Procedure(s) Performed: TOTAL KNEE ARTHROPLASTY (Right Knee)     Patient location during evaluation: PACU Anesthesia Type: Spinal Level of consciousness: oriented and awake and alert Pain management: pain level controlled Vital Signs Assessment: post-procedure vital signs reviewed and stable Respiratory status: spontaneous breathing, respiratory function stable and patient connected to nasal cannula oxygen Cardiovascular status: blood pressure returned to baseline and stable Postop Assessment: no headache, no backache and no apparent nausea or vomiting Anesthetic complications: no    Last Vitals:  Vitals:   01/04/18 1130 01/04/18 1300  BP:  132/75  Pulse: 64 68  Resp: 14 16  Temp: (!) 36.3 C 36.4 C  SpO2: 98% 98%    Last Pain:  Vitals:   01/04/18 1300  TempSrc: Oral  PainSc:                  Aranza Geddes COKER

## 2018-01-04 NOTE — Evaluation (Signed)
Physical Therapy Evaluation Patient Details Name: Crystal Trevino MRN: [id number] DOB: [birth year] Today's Date: 01/04/2018   History of Present Illness  Pt s/p rt TKR. PMH - rt breast DCIS (recent dx), HTN, arthritis  Clinical Impression  Pt presents to PT with expected decr in mobility s/p rt TKR. Expect pt will progress well and pt plans to return home with friend/family.    Follow Up Recommendations DC plan and follow up therapy as arranged by surgeon    Equipment Recommendations  None recommended by PT    Recommendations for Other Services       Precautions / Restrictions Precautions Precautions: Knee Restrictions Weight Bearing Restrictions: Yes RLE Weight Bearing: Weight bearing as tolerated      Mobility  Bed Mobility Overal bed mobility: Needs Assistance Bed Mobility: Supine to Sit     Supine to sit: Min guard;HOB elevated     General bed mobility comments: Incr time  Transfers Overall transfer level: Needs assistance Equipment used: Rolling walker (2 wheeled) Transfers: Sit to/from Stand Sit to Stand: Min guard         General transfer comment: assist for safety  Ambulation/Gait Ambulation/Gait assistance: Min assist Ambulation Distance (Feet): 40 Feet Assistive device: Rolling walker (2 wheeled) Gait Pattern/deviations: Step-through pattern;Decreased step length - left;Decreased stance time - right Gait velocity: decr Gait velocity interpretation: Below normal speed for age/gender General Gait Details: Assist for balance and safety  Stairs            Wheelchair Mobility    Modified Rankin (Stroke Patients Only)       Balance Overall balance assessment: No apparent balance deficits (not formally assessed)                                           Pertinent Vitals/Pain Pain Assessment: 0-10 Pain Score: 1  Pain Location: rt knee Pain Descriptors / Indicators: Sore Pain Intervention(s): Limited activity within  patient's tolerance    Home Living Family/patient expects to be discharged to:: Private residence Living Arrangements: Non-relatives/Friends Available Help at Discharge: Friend(s);Family;Available 24 hours/day Type of Home: House Home Access: Stairs to enter   [employer] of Steps: 1 Home Layout: Two level;Bed/bath upstairs;1/2 bath on main level Home Equipment: Walker - 2 wheels      Prior Function Level of Independence: Independent               Hand Dominance        Extremity/Trunk Assessment   Upper Extremity Assessment Upper Extremity Assessment: Overall WFL for tasks assessed    Lower Extremity Assessment Lower Extremity Assessment: RLE deficits/detail RLE Deficits / Details: Good quad set. Able to perform straight leg raise. Knee AROM 0-86       Communication   Communication: No difficulties  Cognition Arousal/Alertness: Awake/alert Behavior During Therapy: WFL for tasks assessed/performed Overall Cognitive Status: Within Functional Limits for tasks assessed                                        General Comments      Exercises Total Joint Exercises Ankle Circles/Pumps: AROM;5 reps;Both;Seated Quad Sets: Strengthening;Both;5 reps;Seated Heel Slides: AROM;Right;10 reps;Seated Straight Leg Raises: Right;5 reps;Supine Long Arc Quad: AROM;Right;5 reps;Seated Knee Flexion: AROM;Right;5 reps;Seated Goniometric ROM: 0-88   Assessment/Plan  PT Assessment Patient needs continued PT services  PT Problem List Decreased strength;Decreased range of motion;Decreased mobility       PT Treatment Interventions DME instruction;Gait training;Stair training;Functional mobility training;Therapeutic activities;Therapeutic exercise;Patient/family education    PT Goals (Current goals can be found in the Care Plan section)  Acute Rehab PT Goals Patient Stated Goal: return home PT Goal Formulation: With patient Time For Goal  Achievement: 01/08/18 Potential to Achieve Goals: Good    Frequency 7X/week   Barriers to discharge Inaccessible home environment bedroom, bathroom upstairs    Co-evaluation               AM-PAC PT "6 Clicks" Daily Activity  Outcome Measure Difficulty turning over in bed (including adjusting bedclothes, sheets and blankets)?: A Little Difficulty moving from lying on back to sitting on the side of the bed? : A Little Difficulty sitting down on and standing up from a chair with arms (e.g., wheelchair, bedside commode, etc,.)?: Unable Help needed moving to and from a bed to chair (including a wheelchair)?: A Little Help needed walking in hospital room?: A Little Help needed climbing 3-5 steps with a railing? : A Little 6 Click Score: 16    End of Session Equipment Utilized During Treatment: Gait belt Activity Tolerance: Patient tolerated treatment well Patient left: in chair;with call bell/phone within reach;with family/visitor present Nurse Communication: Mobility status PT Visit Diagnosis: Other abnormalities of gait and mobility (R26.89)    Time: 4917-9150 PT Time Calculation (min) (ACUTE ONLY): 31 min   Charges:   PT Evaluation $PT Eval Low Complexity: 1 Low PT Treatments $Gait Training: 8-22 mins   PT G Codes:        [facility] PT [place] 01/04/2018, 3:24 PM

## 2018-01-04 NOTE — Transfer of Care (Signed)
Immediate Anesthesia Transfer of Care Note  Patient: Crystal Trevino  Procedure(s) Performed: TOTAL KNEE ARTHROPLASTY (Right Knee)  Patient Location: PACU  Anesthesia Type:Spinal  Level of Consciousness: drowsy and patient cooperative  Airway & Oxygen Therapy: Patient Spontanous Breathing and Patient connected to face mask oxygen  Post-op Assessment: Report given to RN and Post -op Vital signs reviewed and stable  Post vital signs: Reviewed and stable  Last Vitals:  Vitals:   01/04/18 0607 01/04/18 0917  BP: (!) 145/67 (!) (P) 113/54  Pulse: 69   Resp: 16 (P) 12  Temp: 36.7 C (P) 36.4 C  SpO2: 98%     Last Pain:  Vitals:   01/04/18 0607  TempSrc: Oral  PainSc: 0-No pain      Patients Stated Pain Goal: 3 (61/51/83 4373)  Complications: No apparent anesthesia complications

## 2018-01-04 NOTE — Anesthesia Procedure Notes (Addendum)
Anesthesia Regional Block: Adductor canal block   Pre-Anesthetic Checklist: ,, timeout performed, Correct Patient, Correct Site, Correct Laterality, Correct Procedure, Correct Position, site marked, Risks and benefits discussed, pre-op evaluation,  At surgeon's request and post-op pain management  Laterality: Right  Prep: Maximum Sterile Barrier Precautions used, chloraprep       Needles:  Injection technique: Single-shot  Needle Type: Echogenic Stimulator Needle     Needle Length: 9cm  Needle Gauge: 21     Additional Needles:   Procedures:,,,, ultrasound used (permanent image in chart),,,,  Narrative:  Start time: 01/04/2018 7:10 AM End time: 01/04/2018 7:15 AM Injection made incrementally with aspirations every 5 mL.  Performed by: Personally  Anesthesiologist: Roberts Gaudy, MD  Additional Notes: 2% Lidocaine skin wheel. 30 cc 0.5% Bupivacaine with 1:200 epi injected easily

## 2018-01-04 NOTE — Anesthesia Procedure Notes (Deleted)
Anesthesia Procedure Note     

## 2018-01-04 NOTE — Anesthesia Preprocedure Evaluation (Addendum)
Anesthesia Evaluation  Patient identified by MRN, date of birth, ID band Patient awake    Reviewed: Allergy & Precautions, NPO status , Patient's Chart, lab work & pertinent test results  Airway Mallampati: II  TM Distance: >3 FB Neck ROM: Full    Dental  (+) Teeth Intact, Dental Advisory Given   Pulmonary    breath sounds clear to auscultation       Cardiovascular hypertension,  Rhythm:Regular Rate:Normal     Neuro/Psych    GI/Hepatic   Endo/Other    Renal/GU      Musculoskeletal   Abdominal   Peds  Hematology   Anesthesia Other Findings   Reproductive/Obstetrics                             Anesthesia Physical Anesthesia Plan  ASA: III  Anesthesia Plan: Spinal   Post-op Pain Management:    Induction: Intravenous  PONV Risk Score and Plan: Ondansetron and Dexamethasone  Airway Management Planned: Natural Airway and Simple Face Mask  Additional Equipment:   Intra-op Plan:   Post-operative Plan:   Informed Consent: I have reviewed the patients History and Physical, chart, labs and discussed the procedure including the risks, benefits and alternatives for the proposed anesthesia with the patient or authorized representative who has indicated his/her understanding and acceptance.   Dental advisory given  Plan Discussed with: CRNA and Anesthesiologist  Anesthesia Plan Comments:         Anesthesia Quick Evaluation

## 2018-01-04 NOTE — Progress Notes (Signed)
Orthopedic Tech Progress Note Patient Details:  Crystal Trevino 1941/04/11 [id number]  CPM Right Knee CPM Right Knee: On Right Knee Flexion (Degrees): 90 Right Knee Extension (Degrees): 0 Additional Comments: foot roll  Post Interventions Patient Tolerated: Well Instructions Provided: Care of device  Crystal Trevino 01/04/2018, 10:04 AM

## 2018-01-04 NOTE — Anesthesia Procedure Notes (Signed)
Spinal  Patient location during procedure: OR Start time: 01/04/2018 7:45 AM End time: 01/04/2018 7:50 AM Staffing Anesthesiologist: Roberts Gaudy, MD Performed: anesthesiologist  Preanesthetic Checklist Completed: patient identified, site marked, surgical consent, pre-op evaluation, timeout performed, IV checked, risks and benefits discussed and monitors and equipment checked Spinal Block Patient position: sitting Prep: ChloraPrep Patient monitoring: heart rate, cardiac monitor, continuous pulse ox and blood pressure Approach: midline Injection technique: single-shot Needle Needle type: Pencan  Needle gauge: 24 G Assessment Sensory level: T6 Additional Notes 14 mg 0.75% Bupivacaine injected easily

## 2018-01-04 NOTE — H&P (Signed)
Crystal Trevino MRN:  [id number] DOB/SEX:  02/07/1941/female  CHIEF COMPLAINT:  Painful right Knee  HISTORY: Patient is a 77 y.o. female presented with a history of pain in the right knee. Onset of symptoms was gradual starting a few years ago with unchanged course since that time. Patient has been treated conservatively with over-the-counter NSAIDs and activity modification. Patient currently rates pain in the knee at 10 out of 10 with activity. There is pain at night.  PAST MEDICAL HISTORY: Patient Active Problem List   Diagnosis Date Noted  . Family history of breast cancer   . 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 Aneurysm of splenic artery ([place]) 08/23/2012   Past Medical History:  Diagnosis Date  . Arthritis   . Breast CA ([place])   . Cancer ([place])    HAD RIGHT BREAST BX --GOING TO CANCER CENTER ON WED 12/30/2017  . Constipation 08/13/2011  . Dizziness 08/13/2011  . Family history of breast cancer   . GERD (gastroesophageal reflux disease)   . Heart murmur    "SLIGHT HEART MURMUR"  WAS PICKED UP AS AN ADULT  . Hyperlipidemia   . Hypertension   . Hypothyroidism   . Osteoarthritis of knee    Right  . Peripheral vascular disease ([place])   . Reflux   . Splenic artery aneurysm ([facility])    stable 1.5cm by 08/2013 CT; no further surveillance recommended  . Weight loss    Past Surgical History:  Procedure Laterality Date  . ABDOMINAL HYSTERECTOMY    . BACK SURGERY    . BREAST SURGERY    . CHOLECYSTECTOMY    . KNEE SURGERY    . LUMBAR LAMINECTOMY    . PR VEIN BYPASS GRAFT,AORTO-FEM-POP     PT DENIES ANY TYPE OF SURGERY OF THIS KIND  . TONSILLECTOMY    . VAGINAL PROLAPSE REPAIR     2015   DONE IN [place]     MEDICATIONS:   Medications Prior to Admission  Medication Sig Dispense Refill Last Dose  . aspirin 81 MG tablet Take 81 mg by mouth daily.     Past Month at Unknown time  . carvedilol (COREG) 12.5 MG tablet Take 12.5 mg by mouth 2 (two) times daily  with a meal.     01/04/2018 at 0330  . cholecalciferol (VITAMIN D) 1000 UNITS tablet Take 1,000 Units by mouth daily.   Past Week at Unknown time  . estrogens, conjugated, (PREMARIN) 0.3 MG tablet Take 0.3 mg by mouth every other day.    Taking  . gabapentin (NEURONTIN) 100 MG capsule Take 1 capsule (100 mg total) by mouth at bedtime. 90 capsule 4 01/03/2018 at Unknown time  . levothyroxine (SYNTHROID, LEVOTHROID) 75 MCG tablet Take 75 mcg by mouth daily before breakfast.    01/04/2018 at 0330  . Multiple Vitamin (MULTIVITAMIN PO) Take 1 tablet by mouth daily.     Past Week at Unknown time  . simvastatin (ZOCOR) 20 MG tablet Take 20 mg by mouth at bedtime.     01/03/2018 at Unknown time  . telmisartan (MICARDIS) 20 MG tablet Take 20 mg by mouth daily.   01/03/2018 at Unknown time    ALLERGIES:   Allergies  Allergen Reactions  . Penicillins Nausea And Vomiting and Rash    INTOLERANCE >  Nausea & Vomiting    REVIEW OF SYSTEMS:  A comprehensive review of systems was negative except for: Musculoskeletal: positive for arthralgias and bone pain  FAMILY HISTORY:   Family History  Problem Relation Age of Onset  . Heart disease Mother   . Breast cancer Mother 93       deceased 22; TAH/BSO (age?)  . Heart disease Father   . Breast cancer Daughter        dx 52s; currenlty 1; Neg BRCA1/BRCA2  . Breast cancer Maternal Aunt 44       deceased 38    SOCIAL HISTORY:   Social History   Tobacco Use  . Smoking status: Never Smoker  . Smokeless tobacco: Never Used  Substance Use Topics  . Alcohol use: No     EXAMINATION:  Vital signs in last 24 hours: Weight:  [77.1 kg (170 lb)] 77.1 kg (170 lb) (02/04 0607)  Ht '5\' 6"'$  (1.676 m)   Wt 77.1 kg (170 lb)   BMI 27.44 kg/m   General Appearance:    Alert, cooperative, no distress, appears stated age  Head:    Normocephalic, without obvious abnormality, atraumatic  Eyes:    PERRL, conjunctiva/corneas clear, EOM's intact, fundi    benign, both eyes   Ears:    Normal TM's and external ear canals, both ears  Nose:   Nares normal, septum midline, mucosa normal, no drainage    or sinus tenderness  Throat:   Lips, mucosa, and tongue normal; teeth and gums normal  Neck:   Supple, symmetrical, trachea midline, no adenopathy;    thyroid:  no enlargement/tenderness/nodules; no carotid   bruit or JVD  Back:     Symmetric, no curvature, ROM normal, no CVA tenderness  Lungs:     Clear to auscultation bilaterally, respirations unlabored  Chest Wall:    No tenderness or deformity   Heart:    Regular rate and rhythm, S1 and S2 normal, no murmur, rub   or gallop  Breast Exam:    No tenderness, masses, or nipple abnormality  Abdomen:     Soft, non-tender, bowel sounds active all four quadrants,    no masses, no organomegaly  Genitalia:    Normal female without lesion, discharge or tenderness  Rectal:    Normal tone, no masses or tenderness;   guaiac negative stool  Extremities:   Extremities normal, atraumatic, no cyanosis or edema  Pulses:   2+ and symmetric all extremities  Skin:   Skin color, texture, turgor normal, no rashes or lesions  Lymph nodes:   Cervical, supraclavicular, and axillary nodes normal  Neurologic:   CNII-XII intact, normal strength, sensation and reflexes    throughout    Musculoskeletal:  ROM 0-120, Ligaments intact,  Imaging Review Plain radiographs demonstrate severe degenerative joint disease of the right knee. The overall alignment is neutral. The bone quality appears to be good for age and reported activity level.  Assessment/Plan: Primary osteoarthritis, right knee   The patient history, physical examination and imaging studies are consistent with advanced degenerative joint disease of the right knee. The patient has failed conservative treatment.  The clearance notes were reviewed.  After discussion with the patient it was felt that Total Knee Replacement was indicated. The procedure,  risks, and benefits of total  knee arthroplasty were presented and reviewed. The risks including but not limited to aseptic loosening, infection, blood clots, vascular injury, stiffness, patella tracking problems complications among others were discussed. The patient acknowledged the explanation, agreed to proceed with the plan.  Donia Ast 01/04/2018, 6:13 AM

## 2018-01-05 ENCOUNTER — Encounter (HOSPITAL_COMMUNITY): Payer: Self-pay | Admitting: General Practice

## 2018-01-05 ENCOUNTER — Other Ambulatory Visit: Payer: Self-pay

## 2018-01-05 DIAGNOSIS — Z803 Family history of malignant neoplasm of breast: Secondary | ICD-10-CM | POA: Diagnosis not present

## 2018-01-05 DIAGNOSIS — Z853 Personal history of malignant neoplasm of breast: Secondary | ICD-10-CM | POA: Diagnosis not present

## 2018-01-05 DIAGNOSIS — Z79899 Other long term (current) drug therapy: Secondary | ICD-10-CM | POA: Diagnosis not present

## 2018-01-05 DIAGNOSIS — Z96651 Presence of right artificial knee joint: Secondary | ICD-10-CM | POA: Diagnosis not present

## 2018-01-05 DIAGNOSIS — Z7982 Long term (current) use of aspirin: Secondary | ICD-10-CM | POA: Diagnosis not present

## 2018-01-05 DIAGNOSIS — Z880 Allergy status to penicillin: Secondary | ICD-10-CM | POA: Diagnosis not present

## 2018-01-05 DIAGNOSIS — M1711 Unilateral primary osteoarthritis, right knee: Secondary | ICD-10-CM | POA: Diagnosis not present

## 2018-01-05 LAB — BASIC METABOLIC PANEL
Anion gap: 11 (ref 5–15)
BUN: 13 mg/dL (ref 6–20)
CO2: 23 mmol/L (ref 22–32)
Calcium: 8.8 mg/dL — ABNORMAL LOW (ref 8.9–10.3)
Chloride: 103 mmol/L (ref 101–111)
Creatinine, Ser: 0.96 mg/dL (ref 0.44–1.00)
GFR calc Af Amer: >60 mL/min (ref >60)
GFR calc non Af Amer: 56 mL/min — ABNORMAL LOW (ref >60)
Glucose, Bld: 129 mg/dL — ABNORMAL HIGH (ref 65–99)
Potassium: 4 mmol/L (ref 3.5–5.1)
Sodium: 137 mmol/L (ref 135–145)

## 2018-01-05 LAB — CBC
HCT: 31.8 % — ABNORMAL LOW (ref 36.0–46.0)
Hemoglobin: 10.6 g/dL — ABNORMAL LOW (ref 12.0–15.0)
MCH: 30.9 pg (ref 26.0–34.0)
MCHC: 33.3 g/dL (ref 30.0–36.0)
MCV: 92.7 fL (ref 78.0–100.0)
Platelets: 190 10*3/uL (ref 150–400)
RBC: 3.43 MIL/uL — ABNORMAL LOW (ref 3.87–5.11)
RDW: 13.7 % (ref 11.5–15.5)
WBC: 11.1 10*3/uL — ABNORMAL HIGH (ref 4.0–10.5)

## 2018-01-05 MED ORDER — ASPIRIN 325 MG PO TBEC: 325.0000 mg | DELAYED_RELEASE_TABLET | Freq: Two times a day (BID) | ORAL | 0 refills | Status: DC

## 2018-01-05 MED ORDER — METHOCARBAMOL 500 MG PO TABS: 500.0000 mg | ORAL_TABLET | Freq: Four times a day (QID) | ORAL | 0 refills | Status: DC | PRN

## 2018-01-05 MED ORDER — OXYCODONE HCL 10 MG PO TABS: 10.0000 mg | ORAL_TABLET | ORAL | 0 refills | Status: DC | PRN

## 2018-01-05 NOTE — Progress Notes (Signed)
Patient was discharged to her home with family members.  Discharged via wheel chair.  Patient was provided with written discharge instructions.  Patient and family members verbalize understanding discharge instructions and the need/importance of follow up.

## 2018-01-05 NOTE — Op Note (Signed)
TOTAL KNEE REPLACEMENT OPERATIVE NOTE:  01/04/2018  2:54 PM  PATIENT:  Crystal Trevino  77 y.o. female  PRE-OPERATIVE DIAGNOSIS:  primary osteoarthritis right knee  POST-OPERATIVE DIAGNOSIS:  primary osteoarthritis right knee  PROCEDURE:  Procedure(s): TOTAL KNEE ARTHROPLASTY  SURGEON:  Surgeon(s): Vickey Huger, MD  PHYSICIAN ASSISTANT: Carlyon Shadow, [facility]  ANESTHESIA:   spinal  DRAINS: Hemovac  SPECIMEN: None  COUNTS:  Correct  TOURNIQUET:  37 minutes  DICTATION:  Indication for procedure:    The patient is a 77 y.o. female who has failed conservative treatment for primary osteoarthritis right knee.  Informed consent was obtained prior to anesthesia. The risks versus benefits of the operation were explain and in a way the patient can, and did, understand.   On the implant demand matching protocol, this patient scored 10.  Therefore, this patient was not receive a polyethylene insert with vitamin E which is a high demand implant.  Description of procedure:     The patient was taken to the operating room and placed under anesthesia.  The patient was positioned in the usual fashion taking care that all body parts were adequately padded and/or protected.  I foley catheter was not placed.  A tourniquet was applied and the leg prepped and draped in the usual sterile fashion.  The extremity was exsanguinated with the esmarch and tourniquet inflated to 350 mmHg.  Pre-operative range of motion was normal.  The knee was in 5 degree of mild varus.  A midline incision approximately 6-7 inches long was made with a #10 blade.  A new blade was used to make a parapatellar arthrotomy going 2-3 cm into the quadriceps tendon, over the patella, and alongside the medial aspect of the patellar tendon.  A synovectomy was then performed with the #10 blade and forceps. I then elevated the deep MCL off the medial tibial metaphysis subperiosteally around to the semimembranosus attachment.    I  everted the patella and used calipers to measure patellar thickness.  I used the reamer to ream down to appropriate thickness to recreate the native thickness.  I then removed excess bone with the rongeur and sagittal saw.  I used the appropriately sized template and drilled the three lug holes.  I then put the trial in place and measured the thickness with the calipers to ensure recreation of the native thickness.  The trial was then removed and the patella subluxed and the knee brought into flexion.  A homan retractor was place to retract and protect the patella and lateral structures.  A Z-retractor was place medially to protect the medial structures.  The extra-medullary alignment system was used to make cut the tibial articular surface perpendicular to the anamotic axis of the tibia and in 3 degrees of posterior slope.  The cut surface and alignment jig was removed.  I then used the intramedullary alignment guide to make a 6 valgus cut on the distal femur.  I then marked out the epicondylar axis on the distal femur.  The posterior condylar axis measured 3 degrees.  I then used the anterior referencing sizer and measured the femur to be a size 6.  The 4-In-1 cutting block was screwed into place in external rotation matching the posterior condylar angle, making our cuts perpendicular to the epicondylar axis.  Anterior, posterior and chamfer cuts were made with the sagittal saw.  The cutting block and cut pieces were removed.  A lamina spreader was placed in 90 degrees of flexion.  The ACL,  PCL, menisci, and posterior condylar osteophytes were removed.  A 10 mm spacer blocked was found to offer good flexion and extension gap balance after mild in degree releasing.   The scoop retractor was then placed and the femoral finishing block was pinned in place.  The small sagittal saw was used as well as the lug drill to finish the femur.  The block and cut surfaces were removed and the medullary canal hole filled  with autograft bone from the cut pieces.  The tibia was delivered forward in deep flexion and external rotation.  A size C tray was selected and pinned into place centered on the medial 1/3 of the tibial tubercle.  The reamer and keel was used to prepare the tibia through the tray.    I then trialed with the size 6 femur, size C tibia, a 10 mm insert and the 30 patella.  I had excellent flexion/extension gap balance, excellent patella tracking.  Flexion was full and beyond 120 degrees; extension was zero.  These components were chosen and the staff opened them to me on the back table while the knee was lavaged copiously and the cement mixed.  The soft tissue was infiltrated with 60cc of exparel 1.3% through a 21 gauge needle.  I cemented in the components and removed all excess cement.  The polyethylene tibial component was snapped into place and the knee placed in extension while cement was hardening.  The capsule was infilltrated with 30cc of .25% Marcaine with epinephrine.  A hemovac was place in the joint exiting superolaterally.  A pain pump was place superomedially superficial to the arthrotomy.  Once the cement was hard, the tourniquet was let down.  Hemostasis was obtained.  The arthrotomy was closed with figure-8 #1 vicryl sutures.  The deep soft tissues were closed with #0 vicryls and the subcuticular layer closed with a running #2-0 vicryl.  The skin was reapproximated and closed with skin staples.  The wound was dressed with xeroform, 4 x4's, 2 ABD sponges, a single layer of webril and a TED stocking.   The patient was then awakened, extubated, and taken to the recovery room in stable condition.  BLOOD LOSS:  300cc DRAINS: 1 hemovac, 1 pain catheter COMPLICATIONS:  None.  PLAN OF CARE: Overnight/Outpatient  PATIENT DISPOSITION:  PACU - hemodynamically stable.   Delay start of Pharmacological VTE agent (>24hrs) due to surgical blood loss or risk of bleeding:  not applicable  Please fax  a copy of this op note to my office at [phone] (please only include page 1 and 2 of the Case Information op note)

## 2018-01-05 NOTE — Progress Notes (Signed)
SPORTS MEDICINE AND JOINT REPLACEMENT  Lara Mulch, MD    Carlyon Shadow, PA-C [address], [place], [place]  [address]                             [phone]   PROGRESS NOTE  Subjective:  negative for Chest Pain  negative for Shortness of Breath  negative for Nausea/Vomiting   negative for Calf Pain  negative for Bowel Movement   Tolerating Diet: yes         Patient reports pain as 3 on 0-10 scale.    Objective: Vital signs in last 24 hours:    Patient Vitals for the past 24 hrs:  BP Temp Temp src Pulse Resp SpO2  01/05/18 0631 (!) 110/52 98.2 F (36.8 C) Oral 60 18 97 %  01/05/18 0021 99/64 98 F (36.7 C) Oral 61 18 98 %  01/04/18 2000 (!) 116/56 98 F (36.7 C) Oral 66 16 97 %  01/04/18 1749 132/75 - - 68 - -  01/04/18 1300 132/75 97.6 F (36.4 C) Oral 68 16 98 %  01/04/18 1130 - (!) 97.3 F (36.3 C) - 64 14 98 %  01/04/18 1115 - - - 62 15 98 %  01/04/18 1100 - - - (!) 59 11 99 %  01/04/18 1049 134/70 - - 62 14 -  01/04/18 1045 - - - 62 15 97 %  01/04/18 1030 - - - 63 18 99 %  01/04/18 1015 125/63 - - 61 15 100 %  01/04/18 1000 - - - 60 10 98 %  01/04/18 0945 - - - 63 19 96 %  01/04/18 0941 (!) 122/57 - - - - -  01/04/18 0930 (!) 121/56 - - 60 11 97 %  01/04/18 0917 (!) 113/54 97.6 F (36.4 C) - - 12 -    @flow {1959:LAST@   Intake/Output from previous day:   02/04 0701 - 02/05 0700 In: 1540 [P.O.:240; I.V.:1300] Out: 20    Intake/Output this shift:   No intake/output data recorded.   Intake/Output      02/04 0701 - 02/05 0700 02/05 0701 - 02/06 0700   P.O. 240    I.V. (mL/kg) 1300 (16.9)    Total Intake(mL/kg) 1540 (20)    Urine (mL/kg/hr) 0 (0)    Blood 20    Total Output 20    Net +1520         Urine Occurrence 2 x       LABORATORY DATA: Recent Labs    01/05/18 0614  WBC 11.1*  HGB 10.6*  HCT 31.8*  PLT 190   No results for input(s): NA, K, CL, CO2, BUN, CREATININE, GLUCOSE, CALCIUM in the last 168 hours. No results found  for: INR, PROTIME  Examination:  General appearance: alert, cooperative and no distress Extremities: extremities normal, atraumatic, no cyanosis or edema  Wound Exam: clean, dry, intact   Drainage:  None: wound tissue dry  Motor Exam: Quadriceps and Hamstrings Intact  Sensory Exam: Superficial Peroneal and Deep Peroneal normal   Assessment:    1 Day Post-Op  Procedure(s) (LRB): TOTAL KNEE ARTHROPLASTY (Right)  ADDITIONAL DIAGNOSIS:  Active Problems:   S/P total knee replacement     Plan: Physical Therapy as ordered Weight Bearing as Tolerated (WBAT)  DVT Prophylaxis:  Aspirin  DISCHARGE PLAN: Home  DISCHARGE NEEDS: HHPT   Patient doing well, expected D/C home today  Donia Ast 01/05/2018, 7:17 AM

## 2018-01-05 NOTE — Care Management Note (Signed)
Case Management Note  Patient Details  Name: ANAMARI GALEAS MRN: [id number] Date of Birth: 07/19/1941  Subjective/Objective:  77 yr old female s/p right total knee arthroplasty.                  Action/Plan: Case manager spoke with patient and daughter [name] concerning discharge plan and DME. Patient was preoperatively setup with [facility], no changes. She has rolling walker, CPM has been delivered to her home, she  will not need a 3in1. Patient will have family support at discharge.    Expected Discharge Date:    01/05/18              Expected Discharge Plan:  [place]  In-House Referral:  NA  Discharge planning Services  CM Consult  Post Acute Care Choice:  Home Health Choice offered to:  Patient, Adult Children  DME Arranged:  CPM(Has Gilford Rile) DME Agency:  [employer]  HH Arranged:  PT HH Agency:  [facility] [employer]  Status of Service:  Completed, signed off  If discussed at [employer], dates discussed:    Additional Comments:  Ninfa Meeker, RN 01/05/2018, 11:08 AM

## 2018-01-05 NOTE — Progress Notes (Signed)
Physical Therapy Treatment Patient Details Name: Crystal Trevino MRN: [id number] DOB: [birth year] Today's Date: 01/05/2018    History of Present Illness Pt s/p rt TKR. PMH - rt breast DCIS (recent dx), HTN, arthritis    PT Comments    Continuing work on functional mobility and activity tolerance;  Very nice improvements, nice, stable R knee in stance; stair training initiated; discussed car transfers, performed HEP; Questions solicited and answered; OK for dc home from PT standpoint; if she is still here, will plan for stair training in stairwell  Follow Up Recommendations  DC plan and follow up therapy as arranged by surgeon     Equipment Recommendations  None recommended by PT    Recommendations for Other Services       Precautions / Restrictions Precautions Precautions: Knee Precaution Booklet Issued: Yes (comment) Precaution Comments: Pt educated to not allow any pillow or bolster under knee for healing with optimal range of motion.  Restrictions Weight Bearing Restrictions: Yes RLE Weight Bearing: Weight bearing as tolerated    Mobility  Bed Mobility Overal bed mobility: Independent                Transfers Overall transfer level: Needs assistance Equipment used: Rolling walker (2 wheeled) Transfers: Sit to/from Stand Sit to Stand: Modified independent (Device/Increase time)         General transfer comment: Good rise and hand placment  Ambulation/Gait Ambulation/Gait assistance: Min guard;Supervision Ambulation Distance (Feet): 200 Feet Assistive device: Rolling walker (2 wheeled) Gait Pattern/deviations: Step-through pattern Gait velocity: decr, but improving   General Gait Details: Cues to standi tall on RLE and actvate quad for stance stability   Stairs Stairs: Yes   Stair Management: One rail Left;Forwards;Step to pattern Number of Stairs: 2(x2) General stair comments: Cues for sequence and techqniue  Wheelchair Mobility    Modified  Rankin (Stroke Patients Only)       Balance                                            Cognition Arousal/Alertness: Awake/alert Behavior During Therapy: WFL for tasks assessed/performed Overall Cognitive Status: Within Functional Limits for tasks assessed                                        Exercises Total Joint Exercises Ankle Circles/Pumps: AROM;Both;10 reps Quad Sets: AROM;Right;10 reps Towel Squeeze: AROM;Both;10 reps Short Arc Quad: AROM;Right;10 reps Heel Slides: AROM;Right;10 reps Hip ABduction/ADduction: AROM;Right;10 reps Straight Leg Raises: AROM;Right;10 reps Long Arc Quad: AROM;Right;5 reps;Seated Knee Flexion: AROM;Right;5 reps;Seated Goniometric ROM: approx 0-95deg    General Comments        Pertinent Vitals/Pain Pain Assessment: Faces Faces Pain Scale: Hurts a little bit Pain Location: R knee Pain Descriptors / Indicators: Sore Pain Intervention(s): Monitored during session    Home Living Family/patient expects to be discharged to:: Private residence Living Arrangements: Non-relatives/Friends                  Prior Function            PT Goals (current goals can now be found in the care plan section) Acute Rehab PT Goals Patient Stated Goal: return home; back to shopping PT Goal Formulation: With patient Time For Goal Achievement: 01/08/18 Potential to Achieve Goals:  Good Progress towards PT goals: Progressing toward goals    Frequency    7X/week      PT Plan Current plan remains appropriate    Co-evaluation              AM-PAC PT "6 Clicks" Daily Activity  Outcome Measure  Difficulty turning over in bed (including adjusting bedclothes, sheets and blankets)?: None Difficulty moving from lying on back to sitting on the side of the bed? : None Difficulty sitting down on and standing up from a chair with arms (e.g., wheelchair, bedside commode, etc,.)?: A Little Help needed moving to  and from a bed to chair (including a wheelchair)?: A Little Help needed walking in hospital room?: None Help needed climbing 3-5 steps with a railing? : A Little 6 Click Score: 21    End of Session Equipment Utilized During Treatment: Gait belt Activity Tolerance: Patient tolerated treatment well Patient left: in chair;with call bell/phone within reach;with family/visitor present Nurse Communication: Mobility status PT Visit Diagnosis: Other abnormalities of gait and mobility (R26.89)     Time: 6314-9702 PT Time Calculation (min) (ACUTE ONLY): 32 min  Charges:  $Gait Training: 8-22 mins $Therapeutic Exercise: 8-22 mins                    G Codes:       Roney Marion, PT  Acute Rehabilitation Services Pager [phone] Office [phone]    [name] 01/05/2018, 12:15 PM

## 2018-01-05 NOTE — Discharge Summary (Signed)
SPORTS MEDICINE & JOINT REPLACEMENT   Crystal Mulch, MD   Crystal Shadow, PA-C [place], [place], [place]  [address]                             [phone]  PATIENT ID: Crystal Trevino        MRN:  [id number]          DOB/AGE: [birth year] / 77 y.o.    DISCHARGE SUMMARY  ADMISSION DATE:    01/04/2018 DISCHARGE DATE:   01/05/2018   ADMISSION DIAGNOSIS: primary osteoarthritis right knee    DISCHARGE DIAGNOSIS:  primary osteoarthritis right knee    ADDITIONAL DIAGNOSIS: Active Problems:   S/P total knee replacement  Past Medical History:  Diagnosis Date  . Arthritis   . Breast CA ([facility])   . Cancer ([facility])    HAD RIGHT BREAST BX --GOING [facility] ON [facility]ED 12/30/2017  . Constipation 08/13/2011  . Dizziness 08/13/2011  . Family history of breast cancer   . GERD (gastroesophageal reflux disease)   . Heart murmur    "SLIGHT HEART MURMUR"  WAS PICKED UP AS AN ADULT  . Hyperlipidemia   . Hypertension   . Hypothyroidism   . Osteoarthritis of knee    Right  . Peripheral vascular disease ([facility])   . Reflux   . Splenic artery aneurysm ([facility])    stable 1.5cm by 08/2013 CT; no further surveillance recommended  . Weight loss     PROCEDURE: Procedure(s): TOTAL KNEE ARTHROPLASTY on 01/04/2018  CONSULTS: Treatment Team:  Alphonsa Overall, MD   HISTORY:  See H&P in chart  HOSPITAL COURSE:  Crystal Trevino is a 77 y.o. admitted on 01/04/2018 and found to have a diagnosis of primary osteoarthritis right knee.  After appropriate laboratory studies were obtained  they were taken to the operating room on 01/04/2018 and underwent Procedure(s): TOTAL KNEE ARTHROPLASTY.   They were given perioperative antibiotics:  Anti-infectives (From admission, onward)   Start     Dose/Rate Route Frequency Ordered Stop   01/04/18 1400  clindamycin (CLEOCIN) IVPB 600 mg     600 mg 100 mL/hr over 30 Minutes Intravenous Every 6 hours 01/04/18 1210 01/04/18 2119   01/04/18 0600  clindamycin (CLEOCIN) IVPB  900 mg     900 mg 100 mL/hr over 30 Minutes Intravenous To ShortStay Surgical 01/01/18 1232 01/04/18 0807    .  Patient given tranexamic acid IV or topical and exparel intra-operatively.  Tolerated the procedure well.    POD# 1: Vital signs were stable.  Patient denied Chest pain, shortness of breath, or calf pain.  Patient was started on Lovenox 30 mg subcutaneously twice daily at 8am.  Consults to PT, OT, and care management were made.  The patient was weight bearing as tolerated.  CPM was placed on the operative leg 0-90 degrees for 6-8 hours a day. When out of the CPM, patient was placed in the foam block to achieve full extension. Incentive spirometry was taught.  Dressing was changed.       POD #2, Continued  PT for ambulation and exercise program.  IV saline locked.  O2 discontinued.    The remainder of the hospital course was dedicated to ambulation and strengthening.   The patient was discharged on 1 Day Post-Op in  Good condition.  Blood products given:none  DIAGNOSTIC STUDIES: Recent vital signs:  Patient Vitals for the past 24 hrs:  BP  Temp Temp src Pulse Resp SpO2  01/05/18 0631 (!) 110/52 98.2 F (36.8 C) Oral 60 18 97 %  01/05/18 0021 99/64 98 F (36.7 C) Oral 61 18 98 %  01/04/18 2000 (!) 116/56 98 F (36.7 C) Oral 66 16 97 %  01/04/18 1749 132/75 - - 68 - -  01/04/18 1300 132/75 97.6 F (36.4 C) Oral 68 16 98 %       Recent laboratory studies: Recent Labs    01/05/18 0614  WBC 11.1*  HGB 10.6*  HCT 31.8*  PLT 190   Recent Labs    01/05/18 0614  NA 137  K 4.0  CL 103  CO2 23  BUN 13  CREATININE 0.96  GLUCOSE 129*  CALCIUM 8.8*   No results found for: INR, PROTIME   Recent Radiographic Studies :  Mr Breast Bilateral W Wo Contrast Inc Cad  Result Date: 01/01/2018 CLINICAL DATA:  77 year old female with recent diagnosis of 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 The patient has strong family  history of breast cancer with told her mother being diagnosed at age 17 and her daughter diagnosed at the age of 76 with breast cancer. LABS:  Creatinine of 0.93 and GFR of 59 on 12/28/2017. EXAM: BILATERAL BREAST MRI WITH AND WITHOUT CONTRAST TECHNIQUE: Multiplanar, multisequence MR images of both breasts were obtained prior to and following the intravenous administration of 16 ml of MultiHance. THREE-DIMENSIONAL MR IMAGE RENDERING ON INDEPENDENT WORKSTATION: Three-dimensional MR images were rendered by post-processing of the original MR data on an independent workstation. The three-dimensional MR images were interpreted, and findings are reported in the following complete MRI report for this study. Three dimensional images were evaluated at the independent DynaCad workstation COMPARISON:  Previous exam(s). FINDINGS: Breast composition: c. Heterogeneous fibroglandular tissue. Background parenchymal enhancement: Mild. Right breast: In the inferior right breast, middle depth there is a small hematoma at the site of the stereotactic biopsy indicating DCIS. Just superior and posterior to the biopsy site is an elongated nodular/masslike area of enhancement with plateau characteristics (series 10601, image 187). This area measures approximately 1 cm. There is a more irregular region of masslike enhancement extending anterior and inferior to this, which is just above the small hematoma. All together, the area of enhancement spans approximately 3.5 cm in the posterior-superior to the anterior-inferior orientation. Left breast: No mass or abnormal enhancement. Lymph nodes: There is an asymmetrically enlarged lymph node in the right axilla (series 10601, image 85) which measures 1.2 x 0.7 cm in axial dimensions. No abnormal left axillary lymph nodes are identified. Ancillary findings:  None. IMPRESSION: 1. There is 3.5 cm worth of suspicious enhancement in the inferior right breast. The site of biopsy in along the central  inferior aspect of this enhancement. The posterior superior margin of this enhancement has plateau enhancement and is more nodular, warranting biopsy to evaluate for invasive disease. 2. There is an asymmetrically enlarged right axillary lymph node. While this could be reactive given the recent biopsy, further evaluation is warranted. 3.  No MRI evidence of left breast malignancy. RECOMMENDATION: 1. MRI guided biopsy is recommended for the masslike area of enhancement along the superior posterior margin of enhancement in the inferior right breast, if the patient desires breast conservation. I would recommend at least MRI guided clip placement (if not biopsy) of the anterior extent of the abnormal enhancement to delineate extent of disease. 2. On the right breast mammogram, there is a 4  mm group of indeterminate calcifications which is approximately 3.5 cm superior and anterior to the biopsy proven DCIS. If the patient desires breast conservation, stereotactic biopsy is recommended for these calcifications. 3. If invasive disease is detected in any of the above biopsies, second-look ultrasound is recommended for the enlarged right axillary lymph node, with possible ultrasound biopsy if the lymph node appears abnormal. 4. If the patient decides on mastectomy rather than breast conservation, but there is need to detect invasive disease prior to surgery, I would recommend considering MRI biopsy of the mass like portion of the enhancement on MRI, and second look ultrasound with possible lymph node biopsy in the right axilla. BI-RADS CATEGORY  4: Suspicious. Electronically Signed   By: Ammie Ferrier M.D.   On: 01/01/2018 13:31    DISCHARGE INSTRUCTIONS: Discharge Instructions    CPM   Complete by:  As directed    Continuous passive motion machine (CPM):      Use the CPM from 0 to 90 for 4-6 hours per day.      You may increase by 10 per day.  You may break it up into 2 or 3 sessions per day.      Use CPM for  2 weeks or until you are told to stop.   Call MD / Call 911   Complete by:  As directed    If you experience chest pain or shortness of breath, CALL 911 and be transported to the hospital emergency room.  If you develope a fever above 101 F, pus (white drainage) or increased drainage or redness at the wound, or calf pain, call your surgeon's office.   Constipation Prevention   Complete by:  As directed    Drink plenty of fluids.  Prune juice may be helpful.  You may use a stool softener, such as Colace (over the counter) 100 mg twice a day.  Use MiraLax (over the counter) for constipation as needed.   Diet - low sodium heart healthy   Complete by:  As directed    Discharge instructions   Complete by:  As directed    INSTRUCTIONS AFTER JOINT REPLACEMENT   Remove items at home which could result in a fall. This includes throw rugs or furniture in walking pathways ICE to the affected joint every three hours while awake for 30 minutes at a time, for at least the first 3-5 days, and then as needed for pain and swelling.  Continue to use ice for pain and swelling. You may notice swelling that will progress down to the foot and ankle.  This is normal after surgery.  Elevate your leg when you are not up walking on it.   Continue to use the breathing machine you got in the hospital (incentive spirometer) which will help keep your temperature down.  It is common for your temperature to cycle up and down following surgery, especially at night when you are not up moving around and exerting yourself.  The breathing machine keeps your lungs expanded and your temperature down.   DIET:  As you were doing prior to hospitalization, we recommend a well-balanced diet.  DRESSING / WOUND CARE / SHOWERING  Keep the surgical dressing until follow up.  The dressing is water proof, so you can shower without any extra covering.  IF THE DRESSING FALLS OFF or the wound gets wet inside, change the dressing with sterile  gauze.  Please use good hand washing techniques before changing the dressing.  Do not  use any lotions or creams on the incision until instructed by your surgeon.    ACTIVITY  Increase activity slowly as tolerated, but follow the weight bearing instructions below.   No driving for 6 weeks or until further direction given by your physician.  You cannot drive while taking narcotics.  No lifting or carrying greater than 10 lbs. until further directed by your surgeon. Avoid periods of inactivity such as sitting longer than an hour when not asleep. This helps prevent blood clots.  You may return to work once you are authorized by your doctor.     WEIGHT BEARING   Weight bearing as tolerated with assist device (walker, cane, etc) as directed, use it as long as suggested by your surgeon or therapist, typically at least 4-6 weeks.   EXERCISES  Results after joint replacement surgery are often greatly improved when you follow the exercise, range of motion and muscle strengthening exercises prescribed by your doctor. Safety measures are also important to protect the joint from further injury. Any time any of these exercises cause you to have increased pain or swelling, decrease what you are doing until you are comfortable again and then slowly increase them. If you have problems or questions, call your caregiver or physical therapist for advice.   Rehabilitation is important following a joint replacement. After just a few days of immobilization, the muscles of the leg can become weakened and shrink (atrophy).  These exercises are designed to build up the tone and strength of the thigh and leg muscles and to improve motion. Often times heat used for twenty to thirty minutes before working out will loosen up your tissues and help with improving the range of motion but do not use heat for the first two weeks following surgery (sometimes heat can increase post-operative swelling).   These exercises can be  done on a training (exercise) mat, on the floor, on a table or on a bed. Use whatever works the best and is most comfortable for you.    Use music or television while you are exercising so that the exercises are a pleasant break in your day. This will make your life better with the exercises acting as a break in your routine that you can look forward to.   Perform all exercises about fifteen times, three times per day or as directed.  You should exercise both the operative leg and the other leg as well.   Exercises include:   Quad Sets - Tighten up the muscle on the front of the thigh (Quad) and hold for 5-10 seconds.   Straight Leg Raises - With your knee straight (if you were given a brace, keep it on), lift the leg to 60 degrees, hold for 3 seconds, and slowly lower the leg.  Perform this exercise against resistance later as your leg gets stronger.  Leg Slides: Lying on your back, slowly slide your foot toward your buttocks, bending your knee up off the floor (only go as far as is comfortable). Then slowly slide your foot back down until your leg is flat on the floor again.  Angel Wings: Lying on your back spread your legs to the side as far apart as you can without causing discomfort.  Hamstring Strength:  Lying on your back, push your heel against the floor with your leg straight by tightening up the muscles of your buttocks.  Repeat, but this time bend your knee to a comfortable angle, and push your heel against the floor.  You may put a pillow under the heel to make it more comfortable if necessary.   A rehabilitation program following joint replacement surgery can speed recovery and prevent re-injury in the future due to weakened muscles. Contact your doctor or a physical therapist for more information on knee rehabilitation.    CONSTIPATION  Constipation is defined medically as fewer than three stools per week and severe constipation as less than one stool per week.  Even if you have a  regular bowel pattern at home, your normal regimen is likely to be disrupted due to multiple reasons following surgery.  Combination of anesthesia, postoperative narcotics, change in appetite and fluid intake all can affect your bowels.   YOU MUST use at least one of the following options; they are listed in order of increasing strength to get the job done.  They are all available over the counter, and you may need to use some, POSSIBLY even all of these options:    Drink plenty of fluids (prune juice may be helpful) and high fiber foods Colace 100 mg by mouth twice a day  Senokot for constipation as directed and as needed Dulcolax (bisacodyl), take with full glass of water  Miralax (polyethylene glycol) once or twice a day as needed.  If you have tried all these things and are unable to have a bowel movement in the first 3-4 days after surgery call either your surgeon or your primary doctor.    If you experience loose stools or diarrhea, hold the medications until you stool forms back up.  If your symptoms do not get better within 1 week or if they get worse, check with your doctor.  If you experience "the worst abdominal pain ever" or develop nausea or vomiting, please contact the office immediately for further recommendations for treatment.   ITCHING:  If you experience itching with your medications, try taking only a single pain pill, or even half a pain pill at a time.  You can also use Benadryl over the counter for itching or also to help with sleep.   TED HOSE STOCKINGS:  Use stockings on both legs until for at least 2 weeks or as directed by physician office. They may be removed at night for sleeping.  MEDICATIONS:  See your medication summary on the "After Visit Summary" that nursing will review with you.  You may have some home medications which will be placed on hold until you complete the course of blood thinner medication.  It is important for you to complete the blood thinner  medication as prescribed.  PRECAUTIONS:  If you experience chest pain or shortness of breath - call 911 immediately for transfer to the hospital emergency department.   If you develop a fever greater that 101 F, purulent drainage from wound, increased redness or drainage from wound, foul odor from the wound/dressing, or calf pain - CONTACT YOUR SURGEON.                                                   FOLLOW-UP APPOINTMENTS:  If you do not already have a post-op appointment, please call the office for an appointment to be seen by your surgeon.  Guidelines for how soon to be seen are listed in your "After Visit Summary", but are typically between 1-4 weeks after surgery.  OTHER INSTRUCTIONS:  Knee Replacement:  Do not place pillow under knee, focus on keeping the knee straight while resting. CPM instructions: 0-90 degrees, 2 hours in the morning, 2 hours in the afternoon, and 2 hours in the evening. Place foam block, curve side up under heel at all times except when in CPM or when walking.  DO NOT modify, tear, cut, or change the foam block in any way.  MAKE SURE YOU:  Understand these instructions.  Get help right away if you are not doing well or get worse.    Thank you for letting us be a part of your medical care team.  It is a privilege we respect greatly.  We hope these instructions will help you stay on track for a fast and full recovery!   Increase activity slowly as tolerated   Complete by:  As directed       DISCHARGE MEDICATIONS:   Allergies as of 01/05/2018      Reactions   Penicillins Nausea And Vomiting, Rash   INTOLERANCE >  Nausea & Vomiting      Medication List    STOP taking these medications   aspirin 81 MG tablet Replaced by:  aspirin 325 MG EC tablet     TAKE these medications   aspirin 325 MG EC tablet Take 1 tablet (325 mg total) by mouth 2 (two) times daily. Replaces:  aspirin 81 MG tablet   carvedilol 12.5 MG tablet Commonly known as:  COREG Take  12.5 mg by mouth 2 (two) times daily with a meal.   cholecalciferol 1000 units tablet Commonly known as:  VITAMIN D Take 1,000 Units by mouth daily.   estrogens (conjugated) 0.3 MG tablet Commonly known as:  PREMARIN Take 0.3 mg by mouth every other day.   gabapentin 100 MG capsule Commonly known as:  NEURONTIN Take 1 capsule (100 mg total) by mouth at bedtime.   levothyroxine 75 MCG tablet Commonly known as:  SYNTHROID, LEVOTHROID Take 75 mcg by mouth daily before breakfast.   methocarbamol 500 MG tablet Commonly known as:  ROBAXIN Take 1-2 tablets (500-1,000 mg total) by mouth every 6 (six) hours as needed for muscle spasms.   MULTIVITAMIN PO Take 1 tablet by mouth daily.   Oxycodone HCl 10 MG Tabs Take 1 tablet (10 mg total) by mouth every 4 (four) hours as needed for moderate pain ((score 4 to 6)).   simvastatin 20 MG tablet Commonly known as:  ZOCOR Take 20 mg by mouth at bedtime.   telmisartan 20 MG tablet Commonly known as:  MICARDIS Take 20 mg by mouth daily.            Durable Medical Equipment  (From admission, onward)        Start     Ordered   01/04/18 1211  DME Walker rolling  Once    Question:  Patient needs a walker to treat with the following condition  Answer:  S/P total knee replacement   01/04/18 1210   01/04/18 1211  DME 3 n 1  Once     01/04/18 1210   01/04/18 1211  DME Bedside commode  Once    Question:  Patient needs a bedside commode to treat with the following condition  Answer:  S/P total knee replacement   01/04/18 1210      FOLLOW UP VISIT:   Follow-up Information    [facility] Follow up.   Specialty:  [place] Why:  A representative from [facility] will contact you to arrange start date and time for your therapy. Contact information: [address] [place] [address] [phone]           DISPOSITION: HOME VS. SNF  CONDITION:  Good   Donia Ast 01/05/2018, 12:26 PM

## 2018-01-05 NOTE — Evaluation (Signed)
Occupational Therapy Evaluation Patient Details Name: Crystal Trevino MRN: [id number] DOB: 02/02/1941 Today's Date: 01/05/2018    History of Present Illness Pt s/p rt TKR. PMH - rt breast DCIS (recent dx), HTN, arthritis   Clinical Impression   Patient evaluated by Occupational Therapy with no further acute OT needs identified. All education has been completed and the patient has no further questions. See below for any follow-up Occupational Therapy or equipment needs. OT to sign off. Thank you for referral.      Follow Up Recommendations  No OT follow up    Equipment Recommendations  None recommended by OT    Recommendations for Other Services       Precautions / Restrictions Precautions Precautions: Knee Restrictions RLE Weight Bearing: Weight bearing as tolerated      Mobility Bed Mobility Overal bed mobility: Independent                Transfers Overall transfer level: Needs assistance Equipment used: Rolling walker (2 wheeled) Transfers: Sit to/from Stand Sit to Stand: Modified independent (Device/Increase time)         General transfer comment: min v/c for hand placement initially    Balance                                           ADL either performed or assessed with clinical judgement   ADL Overall ADL's : Modified independent                                           Educated patient on knee full extension with return demonstration, educated tub/shower transfer,never to wash directly on incision site, always use fresh clean linen (one time use then place in laundry), avoid water under bandage and benefits of wrapping dressing, sleeping positioning, avoid putting pillow under knee    Vision Baseline Vision/History: Wears glasses Wears Glasses: At all times       Perception     Praxis      Pertinent Vitals/Pain Pain Assessment: 0-10 Pain Score: 1  Pain Location: rt knee Pain Descriptors /  Indicators: Sore Pain Intervention(s): Monitored during session;Premedicated before session;Repositioned;Ice applied     Hand Dominance Right   Extremity/Trunk Assessment Upper Extremity Assessment Upper Extremity Assessment: Overall WFL for tasks assessed   Lower Extremity Assessment Lower Extremity Assessment: Defer to PT evaluation   Cervical / Trunk Assessment Cervical / Trunk Assessment: Normal   Communication Communication Communication: No difficulties   Cognition Arousal/Alertness: Awake/alert Behavior During Therapy: WFL for tasks assessed/performed Overall Cognitive Status: Within Functional Limits for tasks assessed                                     General Comments  dressing intact and dry    Exercises     Shoulder Instructions      Home Living Family/patient expects to be discharged to:: Private residence Living Arrangements: Non-relatives/Friends Available Help at Discharge: Friend(s);Family;Available 24 hours/day Type of Home: House Home Access: Stairs to enter [employer] of Steps: 1   Home Layout: Two level;Bed/bath upstairs;1/2 bath on main level Alternate Level Stairs-Number of Steps: flight   Bathroom Shower/Tub: Tub/shower unit;Walk-in shower(plans to use Walk  in )   Bathroom Toilet: Standard     Home Equipment: Environmental consultant - 2 wheels;Cane - single point          Prior Functioning/Environment Level of Independence: Independent        Comments: animals one cat / 3 dogs are kitchen area only        OT Problem List: Decreased strength;Decreased activity tolerance;Decreased safety awareness;Decreased knowledge of use of DME or AE;Decreased knowledge of precautions      OT Treatment/Interventions:      OT Goals(Current goals can be found in the care plan section) Acute Rehab OT Goals Patient Stated Goal: return home  OT Frequency:     Barriers to D/C:            Co-evaluation               AM-PAC PT "6 Clicks" Daily Activity     Outcome Measure Help from another person eating meals?: None Help from another person taking care of personal grooming?: None Help from another person toileting, which includes using toliet, bedpan, or urinal?: None Help from another person bathing (including washing, rinsing, drying)?: None Help from another person to put on and taking off regular upper body clothing?: None Help from another person to put on and taking off regular lower body clothing?: None 6 Click Score: 24   End of Session Equipment Utilized During Treatment: Gait belt;Rolling walker CPM Right Knee CPM Right Knee: On Right Knee Flexion (Degrees): 90 Right Knee Extension (Degrees): 0 Additional Comments: removed from CPM on arrival Nurse Communication: Mobility status;Precautions  Activity Tolerance: Patient tolerated treatment well Patient left: in chair;with call bell/phone within reach;with family/visitor present  OT Visit Diagnosis: Unsteadiness on feet (R26.81)                Time: 7711-6579 OT Time Calculation (min): 36 min Charges:  OT General Charges $OT Visit: 1 Visit OT Evaluation $OT Eval Moderate Complexity: 1 Mod OT Treatments $Self Care/Home Management : 8-22 mins G-Codes:      Jeri Modena   OTR/L Pager: [phone] Office: [phone] .   Parke Poisson B 01/05/2018, 9:27 AM

## 2018-01-06 ENCOUNTER — Telehealth: Payer: Self-pay | Admitting: *Deleted

## 2018-01-06 DIAGNOSIS — I10 Essential (primary) hypertension: Secondary | ICD-10-CM | POA: Diagnosis not present

## 2018-01-06 DIAGNOSIS — D0511 Intraductal carcinoma in situ of right breast: Secondary | ICD-10-CM | POA: Diagnosis not present

## 2018-01-06 DIAGNOSIS — Z7982 Long term (current) use of aspirin: Secondary | ICD-10-CM | POA: Diagnosis not present

## 2018-01-06 DIAGNOSIS — Z471 Aftercare following joint replacement surgery: Secondary | ICD-10-CM | POA: Diagnosis not present

## 2018-01-06 DIAGNOSIS — I739 Peripheral vascular disease, unspecified: Secondary | ICD-10-CM | POA: Diagnosis not present

## 2018-01-06 DIAGNOSIS — Z9181 History of falling: Secondary | ICD-10-CM | POA: Diagnosis not present

## 2018-01-06 DIAGNOSIS — Z96651 Presence of right artificial knee joint: Secondary | ICD-10-CM | POA: Diagnosis not present

## 2018-01-06 NOTE — Telephone Encounter (Signed)
Spoke to pt concerning [place] from 1.30.19. Denies questions or concerns regarding dx or treatment care plan. Encourage pt to call with needs. Received verbal understanding.

## 2018-01-07 DIAGNOSIS — I10 Essential (primary) hypertension: Secondary | ICD-10-CM | POA: Diagnosis not present

## 2018-01-07 DIAGNOSIS — D0511 Intraductal carcinoma in situ of right breast: Secondary | ICD-10-CM | POA: Diagnosis not present

## 2018-01-07 DIAGNOSIS — Z96651 Presence of right artificial knee joint: Secondary | ICD-10-CM | POA: Diagnosis not present

## 2018-01-07 DIAGNOSIS — Z9181 History of falling: Secondary | ICD-10-CM | POA: Diagnosis not present

## 2018-01-07 DIAGNOSIS — Z471 Aftercare following joint replacement surgery: Secondary | ICD-10-CM | POA: Diagnosis not present

## 2018-01-07 DIAGNOSIS — Z7982 Long term (current) use of aspirin: Secondary | ICD-10-CM | POA: Diagnosis not present

## 2018-01-07 DIAGNOSIS — I739 Peripheral vascular disease, unspecified: Secondary | ICD-10-CM | POA: Diagnosis not present

## 2018-01-08 DIAGNOSIS — D0511 Intraductal carcinoma in situ of right breast: Secondary | ICD-10-CM | POA: Diagnosis not present

## 2018-01-08 DIAGNOSIS — I739 Peripheral vascular disease, unspecified: Secondary | ICD-10-CM | POA: Diagnosis not present

## 2018-01-08 DIAGNOSIS — Z471 Aftercare following joint replacement surgery: Secondary | ICD-10-CM | POA: Diagnosis not present

## 2018-01-08 DIAGNOSIS — I10 Essential (primary) hypertension: Secondary | ICD-10-CM | POA: Diagnosis not present

## 2018-01-08 DIAGNOSIS — Z7982 Long term (current) use of aspirin: Secondary | ICD-10-CM | POA: Diagnosis not present

## 2018-01-08 DIAGNOSIS — Z96651 Presence of right artificial knee joint: Secondary | ICD-10-CM | POA: Diagnosis not present

## 2018-01-08 DIAGNOSIS — Z9181 History of falling: Secondary | ICD-10-CM | POA: Diagnosis not present

## 2018-01-11 DIAGNOSIS — Z96651 Presence of right artificial knee joint: Secondary | ICD-10-CM | POA: Diagnosis not present

## 2018-01-11 DIAGNOSIS — Z471 Aftercare following joint replacement surgery: Secondary | ICD-10-CM | POA: Diagnosis not present

## 2018-01-11 DIAGNOSIS — Z7982 Long term (current) use of aspirin: Secondary | ICD-10-CM | POA: Diagnosis not present

## 2018-01-11 DIAGNOSIS — I10 Essential (primary) hypertension: Secondary | ICD-10-CM | POA: Diagnosis not present

## 2018-01-11 DIAGNOSIS — D0511 Intraductal carcinoma in situ of right breast: Secondary | ICD-10-CM | POA: Diagnosis not present

## 2018-01-11 DIAGNOSIS — Z9181 History of falling: Secondary | ICD-10-CM | POA: Diagnosis not present

## 2018-01-11 DIAGNOSIS — I739 Peripheral vascular disease, unspecified: Secondary | ICD-10-CM | POA: Diagnosis not present

## 2018-01-13 DIAGNOSIS — Z471 Aftercare following joint replacement surgery: Secondary | ICD-10-CM | POA: Diagnosis not present

## 2018-01-13 DIAGNOSIS — I739 Peripheral vascular disease, unspecified: Secondary | ICD-10-CM | POA: Diagnosis not present

## 2018-01-13 DIAGNOSIS — Z7982 Long term (current) use of aspirin: Secondary | ICD-10-CM | POA: Diagnosis not present

## 2018-01-13 DIAGNOSIS — Z9181 History of falling: Secondary | ICD-10-CM | POA: Diagnosis not present

## 2018-01-13 DIAGNOSIS — I10 Essential (primary) hypertension: Secondary | ICD-10-CM | POA: Diagnosis not present

## 2018-01-13 DIAGNOSIS — Z96651 Presence of right artificial knee joint: Secondary | ICD-10-CM | POA: Diagnosis not present

## 2018-01-13 DIAGNOSIS — D0511 Intraductal carcinoma in situ of right breast: Secondary | ICD-10-CM | POA: Diagnosis not present

## 2018-01-14 ENCOUNTER — Other Ambulatory Visit: Payer: Self-pay | Admitting: Surgery

## 2018-01-14 DIAGNOSIS — R921 Mammographic calcification found on diagnostic imaging of breast: Secondary | ICD-10-CM

## 2018-01-14 DIAGNOSIS — N6310 Unspecified lump in the right breast, unspecified quadrant: Secondary | ICD-10-CM

## 2018-01-14 DIAGNOSIS — Z471 Aftercare following joint replacement surgery: Secondary | ICD-10-CM | POA: Diagnosis not present

## 2018-01-14 DIAGNOSIS — Z96651 Presence of right artificial knee joint: Secondary | ICD-10-CM | POA: Diagnosis not present

## 2018-01-15 DIAGNOSIS — M25561 Pain in right knee: Secondary | ICD-10-CM | POA: Diagnosis not present

## 2018-01-15 DIAGNOSIS — G8929 Other chronic pain: Secondary | ICD-10-CM | POA: Diagnosis not present

## 2018-01-15 DIAGNOSIS — R262 Difficulty in walking, not elsewhere classified: Secondary | ICD-10-CM | POA: Diagnosis not present

## 2018-01-15 DIAGNOSIS — M25661 Stiffness of right knee, not elsewhere classified: Secondary | ICD-10-CM | POA: Diagnosis not present

## 2018-01-20 DIAGNOSIS — M25661 Stiffness of right knee, not elsewhere classified: Secondary | ICD-10-CM | POA: Diagnosis not present

## 2018-01-20 DIAGNOSIS — M25561 Pain in right knee: Secondary | ICD-10-CM | POA: Diagnosis not present

## 2018-01-20 DIAGNOSIS — G8929 Other chronic pain: Secondary | ICD-10-CM | POA: Diagnosis not present

## 2018-01-20 DIAGNOSIS — R262 Difficulty in walking, not elsewhere classified: Secondary | ICD-10-CM | POA: Diagnosis not present

## 2018-01-21 ENCOUNTER — Telehealth: Payer: Self-pay

## 2018-01-21 NOTE — Telephone Encounter (Signed)
Received VM from pt's daughter regarding results of genetics testing.  Unable to find results at this time, message forwarded to [facility]- Dr Magrinat's nurse to review chart and call pt/ daughter.

## 2018-01-22 DIAGNOSIS — M25661 Stiffness of right knee, not elsewhere classified: Secondary | ICD-10-CM | POA: Diagnosis not present

## 2018-01-22 DIAGNOSIS — R262 Difficulty in walking, not elsewhere classified: Secondary | ICD-10-CM | POA: Diagnosis not present

## 2018-01-22 DIAGNOSIS — M25561 Pain in right knee: Secondary | ICD-10-CM | POA: Diagnosis not present

## 2018-01-22 DIAGNOSIS — G8929 Other chronic pain: Secondary | ICD-10-CM | POA: Diagnosis not present

## 2018-01-25 ENCOUNTER — Other Ambulatory Visit: Payer: Self-pay | Admitting: Surgery

## 2018-01-25 ENCOUNTER — Ambulatory Visit
Admission: RE | Admit: 2018-01-25 | Discharge: 2018-01-25 | Disposition: A | Discharge status: Home / Self Care | Payer: Federal, State, Local not specified - PPO | Source: Ambulatory Visit | Attending: Surgery | Admitting: Surgery

## 2018-01-25 DIAGNOSIS — N6011 Diffuse cystic mastopathy of right breast: Secondary | ICD-10-CM | POA: Diagnosis not present

## 2018-01-25 DIAGNOSIS — R921 Mammographic calcification found on diagnostic imaging of breast: Secondary | ICD-10-CM

## 2018-01-27 DIAGNOSIS — R262 Difficulty in walking, not elsewhere classified: Secondary | ICD-10-CM | POA: Diagnosis not present

## 2018-01-28 ENCOUNTER — Encounter: Payer: Self-pay | Admitting: Genetic Counselor

## 2018-01-28 ENCOUNTER — Ambulatory Visit: Payer: Self-pay | Admitting: Genetic Counselor

## 2018-01-28 DIAGNOSIS — Z1379 Encounter for other screening for genetic and chromosomal anomalies: Secondary | ICD-10-CM

## 2018-01-28 HISTORY — DX: Encounter for other screening for genetic and chromosomal anomalies: Z13.79

## 2018-01-28 NOTE — Progress Notes (Signed)
Cancer Genetics Clinic       Genetic Test Results    Patient Name: Crystal Trevino Patient DOB: 04-07-1941 Patient Age: 77 y.o. Encounter Date: 01/28/2018  Referring Provider: Lurline Del, MD  Primary Care Provider: Cari Caraway, MD   Ms. Crystal Trevino was called today to discuss genetic test results. Please see the Genetics note from her visit on 12/31/2017 for a detailed discussion of her personal and family history.  Genetic Testing: At the time of Crystal Trevino's visit, she decided to pursue genetic testing of multiple genes associated with hereditary susceptibility to cancer. Testing included sequencing and deletion/duplication analysis. Testing did not reveal a pathogenic mutation in any of the genes analyzed.  A copy of the genetic test report will be scanned into Epic under the Media tab.  The genes analyzed were the 83 genes on [facility]'s Multi-Cancer panel (ALK, APC, ATM, AXIN2, BAP1, BARD1, BLM, BMPR1A, BRCA1, BRCA2, BRIP1, CASR, CDC73, CDH1, CDK4, CDKN1B, CDKN1C, CDKN2A, CEBPA, CHEK2, CTNNA1, DICER1, DIS3L2, EGFR, EPCAM, FH, FLCN, GATA2, GPC3, GREM1, HOXB13, HRAS, KIT, MAX, MEN1, MET, MITF, MLH1, MSH2, MSH3, MSH6, MUTYH, NBN, NF1, NF2, NTHL1, PALB2, PDGFRA, PHOX2B, PMS2, POLD1, POLE, POT1, PRKAR1A, PTCH1, PTEN, RAD50, RAD51C, RAD51D, RB1, RECQL4, RET, RUNX1, SDHA, SDHAF2, SDHB, SDHC, SDHD, SMAD4, SMARCA4, SMARCB1, SMARCE1, STK11, SUFU, TERC, TERT, TMEM127, TP53, TSC1, TSC2, VHL, WRN, WT1).  Since the current test is not perfect, it is possible that there may be a gene mutation that current testing cannot detect, but that chance is small. It is possible that a different genetic factor, which has not yet been discovered or is not on this panel, is responsible for the cancer diagnoses in the family. Again, the likelihood of this is low. No additional testing is recommended at this time for Crystal Trevino.  Cancer Screening: These results suggest that Crystal Trevino's cancer  was most likely not due to an inherited predisposition. Most cancers happen by chance and this test, along with details of her family history, suggests that her cancer falls into this category. She is recommended to follow the cancer screening guidelines provided by her physician.   Family Members: Family members are at some increased risk of developing cancer, over the general population risk, simply due to the family history. They are recommended to speak with their own providers about appropriate cancer screenings.  Any relative who had cancer at a young age or had a particularly rare cancer may also wish to pursue genetic testing. Her daughter reportedly had BRCA1/BRCA2 analysis that was negative. She may wish to pursue panel testing as well as she was diagnosed with breast cancer in her 24s. Genetic counselors can be located in other cities, by visiting the website of the [employer] (ArtistMovie.se) and Field seismologist for a Dietitian by zip code.    Lastly, cancer genetics is a rapidly advancing field and it is possible that new genetic tests will be appropriate for Crystal Trevino in the future. We encourage her to remain in contact with [place] on an annual basis so we can update her personal and family histories, and let her know of advances in cancer genetics that may benefit the family. Our contact number was provided. Crystal Trevino is welcome to call anytime with additional questions.     Steele Berg, MS, [place] Certified Genetic Counselor phone: [phone]

## 2018-01-29 ENCOUNTER — Other Ambulatory Visit: Payer: Self-pay | Admitting: Surgery

## 2018-01-29 ENCOUNTER — Ambulatory Visit
Admission: RE | Admit: 2018-01-29 | Discharge: 2018-01-29 | Disposition: A | Discharge status: Home / Self Care | Payer: Federal, State, Local not specified - PPO | Source: Ambulatory Visit | Attending: Surgery | Admitting: Surgery

## 2018-01-29 DIAGNOSIS — G8929 Other chronic pain: Secondary | ICD-10-CM | POA: Diagnosis not present

## 2018-01-29 DIAGNOSIS — N6489 Other specified disorders of breast: Secondary | ICD-10-CM | POA: Diagnosis not present

## 2018-01-29 DIAGNOSIS — N6310 Unspecified lump in the right breast, unspecified quadrant: Secondary | ICD-10-CM

## 2018-01-29 DIAGNOSIS — M25561 Pain in right knee: Secondary | ICD-10-CM | POA: Diagnosis not present

## 2018-01-29 DIAGNOSIS — N6011 Diffuse cystic mastopathy of right breast: Secondary | ICD-10-CM | POA: Diagnosis not present

## 2018-01-29 DIAGNOSIS — M25661 Stiffness of right knee, not elsewhere classified: Secondary | ICD-10-CM | POA: Diagnosis not present

## 2018-01-29 DIAGNOSIS — R262 Difficulty in walking, not elsewhere classified: Secondary | ICD-10-CM | POA: Diagnosis not present

## 2018-01-29 MED ORDER — GADOBENATE DIMEGLUMINE 529 MG/ML IV SOLN
16.0000 mL | Freq: Once | INTRAVENOUS | Status: AC | PRN
  Administered 2018-01-29: 16 mL via INTRAVENOUS

## 2018-02-01 ENCOUNTER — Ambulatory Visit: Payer: Self-pay | Admitting: Surgery

## 2018-02-01 DIAGNOSIS — D0591 Unspecified type of carcinoma in situ of right breast: Secondary | ICD-10-CM

## 2018-02-02 DIAGNOSIS — R262 Difficulty in walking, not elsewhere classified: Secondary | ICD-10-CM | POA: Diagnosis not present

## 2018-02-05 DIAGNOSIS — M25661 Stiffness of right knee, not elsewhere classified: Secondary | ICD-10-CM | POA: Diagnosis not present

## 2018-02-05 DIAGNOSIS — R262 Difficulty in walking, not elsewhere classified: Secondary | ICD-10-CM | POA: Diagnosis not present

## 2018-02-05 DIAGNOSIS — M25561 Pain in right knee: Secondary | ICD-10-CM | POA: Diagnosis not present

## 2018-02-05 DIAGNOSIS — G8929 Other chronic pain: Secondary | ICD-10-CM | POA: Diagnosis not present

## 2018-02-09 DIAGNOSIS — R262 Difficulty in walking, not elsewhere classified: Secondary | ICD-10-CM | POA: Diagnosis not present

## 2018-02-12 ENCOUNTER — Other Ambulatory Visit: Payer: Self-pay | Admitting: *Deleted

## 2018-02-12 DIAGNOSIS — D0511 Intraductal carcinoma in situ of right breast: Secondary | ICD-10-CM

## 2018-02-12 DIAGNOSIS — R262 Difficulty in walking, not elsewhere classified: Secondary | ICD-10-CM | POA: Diagnosis not present

## 2018-02-12 DIAGNOSIS — M25661 Stiffness of right knee, not elsewhere classified: Secondary | ICD-10-CM | POA: Diagnosis not present

## 2018-02-12 DIAGNOSIS — M25561 Pain in right knee: Secondary | ICD-10-CM | POA: Diagnosis not present

## 2018-02-12 DIAGNOSIS — G8929 Other chronic pain: Secondary | ICD-10-CM | POA: Diagnosis not present

## 2018-02-15 ENCOUNTER — Encounter: Payer: Self-pay | Admitting: Radiation Oncology

## 2018-02-15 DIAGNOSIS — G8929 Other chronic pain: Secondary | ICD-10-CM | POA: Diagnosis not present

## 2018-02-15 DIAGNOSIS — M25561 Pain in right knee: Secondary | ICD-10-CM | POA: Diagnosis not present

## 2018-02-15 DIAGNOSIS — M25661 Stiffness of right knee, not elsewhere classified: Secondary | ICD-10-CM | POA: Diagnosis not present

## 2018-02-15 DIAGNOSIS — R262 Difficulty in walking, not elsewhere classified: Secondary | ICD-10-CM | POA: Diagnosis not present

## 2018-02-17 DIAGNOSIS — D0591 Unspecified type of carcinoma in situ of right breast: Secondary | ICD-10-CM | POA: Diagnosis not present

## 2018-02-17 DIAGNOSIS — M25661 Stiffness of right knee, not elsewhere classified: Secondary | ICD-10-CM | POA: Diagnosis not present

## 2018-02-17 DIAGNOSIS — R262 Difficulty in walking, not elsewhere classified: Secondary | ICD-10-CM | POA: Diagnosis not present

## 2018-02-17 DIAGNOSIS — M25561 Pain in right knee: Secondary | ICD-10-CM | POA: Diagnosis not present

## 2018-02-17 DIAGNOSIS — G8929 Other chronic pain: Secondary | ICD-10-CM | POA: Diagnosis not present

## 2018-02-18 ENCOUNTER — Other Ambulatory Visit: Payer: Self-pay

## 2018-02-18 ENCOUNTER — Encounter (HOSPITAL_COMMUNITY): Payer: Self-pay | Admitting: *Deleted

## 2018-02-18 NOTE — Progress Notes (Signed)
Pt denies SOB, chest pain, and being under the care of a cardiologist. Pt denies having a cardiac cath and echo but stated that a stress test was performed > 10 years ago. Pt denies having a chest x ray within the last year. Pt denies recent labs. Pt made aware to stop taking vitamins, fish oil and herbal medications. Do not take any NSAIDs ie: Ibuprofen, Advil, Naproxen (Aleve), Motrin, BC and Goody Powder. Pt verbalized understanding of all pre-op instructions.

## 2018-02-19 ENCOUNTER — Encounter (HOSPITAL_COMMUNITY): Payer: Self-pay | Admitting: *Deleted

## 2018-02-19 ENCOUNTER — Ambulatory Visit (HOSPITAL_COMMUNITY): Payer: Federal, State, Local not specified - PPO

## 2018-02-19 ENCOUNTER — Ambulatory Visit (HOSPITAL_COMMUNITY)
Admission: RE | Admit: 2018-02-19 | Discharge: 2018-02-19 | Disposition: A | Discharge status: Home / Self Care | Payer: Federal, State, Local not specified - PPO | Source: Ambulatory Visit | Attending: Surgery | Admitting: Surgery

## 2018-02-19 ENCOUNTER — Encounter (HOSPITAL_COMMUNITY)
Admission: RE | Disposition: A | Discharge status: Home / Self Care | Payer: Self-pay | Source: Ambulatory Visit | Attending: Surgery

## 2018-02-19 DIAGNOSIS — Z79899 Other long term (current) drug therapy: Secondary | ICD-10-CM | POA: Insufficient documentation

## 2018-02-19 DIAGNOSIS — I10 Essential (primary) hypertension: Secondary | ICD-10-CM | POA: Insufficient documentation

## 2018-02-19 DIAGNOSIS — E039 Hypothyroidism, unspecified: Secondary | ICD-10-CM | POA: Insufficient documentation

## 2018-02-19 DIAGNOSIS — D0511 Intraductal carcinoma in situ of right breast: Secondary | ICD-10-CM | POA: Diagnosis not present

## 2018-02-19 DIAGNOSIS — Z803 Family history of malignant neoplasm of breast: Secondary | ICD-10-CM | POA: Diagnosis not present

## 2018-02-19 DIAGNOSIS — I728 Aneurysm of other specified arteries: Secondary | ICD-10-CM | POA: Diagnosis not present

## 2018-02-19 HISTORY — DX: Family history of other specified conditions: Z84.89

## 2018-02-19 HISTORY — PX: BREAST LUMPECTOMY WITH RADIOACTIVE SEED LOCALIZATION: SHX6424

## 2018-02-19 LAB — CBC
HCT: 34.8 % — ABNORMAL LOW (ref 36.0–46.0)
Hemoglobin: 11 g/dL — ABNORMAL LOW (ref 12.0–15.0)
MCH: 29.7 pg (ref 26.0–34.0)
MCHC: 31.6 g/dL (ref 30.0–36.0)
MCV: 94.1 fL (ref 78.0–100.0)
Platelets: 235 10*3/uL (ref 150–400)
RBC: 3.7 MIL/uL — ABNORMAL LOW (ref 3.87–5.11)
RDW: 14.3 % (ref 11.5–15.5)
WBC: 6.4 10*3/uL (ref 4.0–10.5)

## 2018-02-19 LAB — BASIC METABOLIC PANEL
Anion gap: 9 (ref 5–15)
BUN: 15 mg/dL (ref 6–20)
CO2: 24 mmol/L (ref 22–32)
Calcium: 9.2 mg/dL (ref 8.9–10.3)
Chloride: 104 mmol/L (ref 101–111)
Creatinine, Ser: 0.77 mg/dL (ref 0.44–1.00)
GFR calc Af Amer: >60 mL/min (ref >60)
GFR calc non Af Amer: >60 mL/min (ref >60)
Glucose, Bld: 92 mg/dL (ref 65–99)
Potassium: 4.1 mmol/L (ref 3.5–5.1)
Sodium: 137 mmol/L (ref 135–145)

## 2018-02-19 SURGERY — BREAST LUMPECTOMY WITH RADIOACTIVE SEED LOCALIZATION
Anesthesia: General | Site: Breast | Laterality: Right

## 2018-02-19 MED ORDER — CHLORHEXIDINE GLUCONATE CLOTH 2 % EX PADS: 6.0000 | MEDICATED_PAD | Freq: Once | CUTANEOUS | Status: DC

## 2018-02-19 MED ORDER — HYDROCODONE-ACETAMINOPHEN 5-325 MG PO TABS: 1.0000 | ORAL_TABLET | Freq: Four times a day (QID) | ORAL | 0 refills | Status: DC | PRN

## 2018-02-19 MED ORDER — BUPIVACAINE-EPINEPHRINE (PF) 0.25% -1:200000 IJ SOLN
INTRAMUSCULAR | Status: AC
  Filled 2018-02-19: qty 30

## 2018-02-19 MED ORDER — OXYCODONE HCL 5 MG PO TABS: 5.0000 mg | ORAL_TABLET | Freq: Once | ORAL | Status: DC | PRN

## 2018-02-19 MED ORDER — BUPIVACAINE-EPINEPHRINE 0.25% -1:200000 IJ SOLN
INTRAMUSCULAR | Status: DC | PRN
  Administered 2018-02-19: 30 mL

## 2018-02-19 MED ORDER — FENTANYL CITRATE (PF) 100 MCG/2ML IJ SOLN: 25.0000 ug | INTRAMUSCULAR | Status: DC | PRN

## 2018-02-19 MED ORDER — PROPOFOL 10 MG/ML IV BOLUS
INTRAVENOUS | Status: AC
  Filled 2018-02-19: qty 20

## 2018-02-19 MED ORDER — ACETAMINOPHEN 500 MG PO TABS: 1000.0000 mg | ORAL_TABLET | ORAL | Status: DC

## 2018-02-19 MED ORDER — PHENYLEPHRINE 40 MCG/ML (10ML) SYRINGE FOR IV PUSH (FOR BLOOD PRESSURE SUPPORT)
PREFILLED_SYRINGE | INTRAVENOUS | Status: AC
  Filled 2018-02-19: qty 10

## 2018-02-19 MED ORDER — ACETAMINOPHEN 325 MG PO TABS: 325.0000 mg | ORAL_TABLET | ORAL | Status: DC | PRN

## 2018-02-19 MED ORDER — LACTATED RINGERS IV SOLN
INTRAVENOUS | Status: DC
  Administered 2018-02-19: 10:00:00 via INTRAVENOUS

## 2018-02-19 MED ORDER — GABAPENTIN 300 MG PO CAPS
ORAL_CAPSULE | ORAL | Status: AC
  Administered 2018-02-19: 300 mg
  Filled 2018-02-19: qty 1

## 2018-02-19 MED ORDER — ONDANSETRON HCL 4 MG/2ML IJ SOLN: 4.0000 mg | Freq: Once | INTRAMUSCULAR | Status: DC | PRN

## 2018-02-19 MED ORDER — EPHEDRINE SULFATE-NACL 50-0.9 MG/10ML-% IV SOSY
PREFILLED_SYRINGE | INTRAVENOUS | Status: DC | PRN
  Administered 2018-02-19: 10 mg via INTRAVENOUS
  Administered 2018-02-19 (×2): 5 mg via INTRAVENOUS
  Administered 2018-02-19: 10 mg via INTRAVENOUS
  Administered 2018-02-19: 5 mg via INTRAVENOUS
  Administered 2018-02-19: 10 mg via INTRAVENOUS

## 2018-02-19 MED ORDER — GABAPENTIN 300 MG PO CAPS: 300.0000 mg | ORAL_CAPSULE | ORAL | Status: DC

## 2018-02-19 MED ORDER — MEPERIDINE HCL 50 MG/ML IJ SOLN: 6.2500 mg | INTRAMUSCULAR | Status: DC | PRN

## 2018-02-19 MED ORDER — EPHEDRINE 5 MG/ML INJ
INTRAVENOUS | Status: AC
  Filled 2018-02-19: qty 10

## 2018-02-19 MED ORDER — DEXAMETHASONE SODIUM PHOSPHATE 4 MG/ML IJ SOLN
INTRAMUSCULAR | Status: DC | PRN
  Administered 2018-02-19: 10 mg via INTRAVENOUS

## 2018-02-19 MED ORDER — PROPOFOL 10 MG/ML IV BOLUS
INTRAVENOUS | Status: DC | PRN
  Administered 2018-02-19: 120 mg via INTRAVENOUS

## 2018-02-19 MED ORDER — KETOROLAC TROMETHAMINE 30 MG/ML IJ SOLN: 30.0000 mg | Freq: Once | INTRAMUSCULAR | Status: DC | PRN

## 2018-02-19 MED ORDER — LIDOCAINE HCL (CARDIAC) 20 MG/ML IV SOLN
INTRAVENOUS | Status: AC
  Filled 2018-02-19: qty 5

## 2018-02-19 MED ORDER — CEFAZOLIN SODIUM-DEXTROSE 2-4 GM/100ML-% IV SOLN
INTRAVENOUS | Status: AC
  Filled 2018-02-19: qty 100

## 2018-02-19 MED ORDER — FENTANYL CITRATE (PF) 100 MCG/2ML IJ SOLN
INTRAMUSCULAR | Status: DC | PRN
  Administered 2018-02-19 (×2): 50 ug via INTRAVENOUS
  Administered 2018-02-19: 25 ug via INTRAVENOUS

## 2018-02-19 MED ORDER — ONDANSETRON HCL 4 MG/2ML IJ SOLN
INTRAMUSCULAR | Status: DC | PRN
  Administered 2018-02-19: 4 mg via INTRAVENOUS

## 2018-02-19 MED ORDER — ACETAMINOPHEN 500 MG PO TABS
ORAL_TABLET | ORAL | Status: AC
  Administered 2018-02-19: 1000 mg
  Filled 2018-02-19: qty 2

## 2018-02-19 MED ORDER — DEXAMETHASONE SODIUM PHOSPHATE 10 MG/ML IJ SOLN
INTRAMUSCULAR | Status: AC
  Filled 2018-02-19: qty 1

## 2018-02-19 MED ORDER — CLINDAMYCIN PHOSPHATE 900 MG/50ML IV SOLN
900.0000 mg | Freq: Once | INTRAVENOUS | Status: AC
Start: 2018-02-19 — End: 2018-02-19
  Administered 2018-02-19: 900 mg via INTRAVENOUS

## 2018-02-19 MED ORDER — ACETAMINOPHEN 160 MG/5ML PO SOLN: 325.0000 mg | ORAL | Status: DC | PRN

## 2018-02-19 MED ORDER — 0.9 % SODIUM CHLORIDE (POUR BTL) OPTIME
TOPICAL | Status: DC | PRN
  Administered 2018-02-19: 1000 mL

## 2018-02-19 MED ORDER — CEFAZOLIN SODIUM-DEXTROSE 2-4 GM/100ML-% IV SOLN: 2.0000 g | INTRAVENOUS | Status: DC

## 2018-02-19 MED ORDER — CLINDAMYCIN PHOSPHATE 900 MG/50ML IV SOLN
INTRAVENOUS | Status: AC
  Filled 2018-02-19: qty 50

## 2018-02-19 MED ORDER — PHENYLEPHRINE 40 MCG/ML (10ML) SYRINGE FOR IV PUSH (FOR BLOOD PRESSURE SUPPORT)
PREFILLED_SYRINGE | INTRAVENOUS | Status: DC | PRN
  Administered 2018-02-19: 40 ug via INTRAVENOUS
  Administered 2018-02-19: 80 ug via INTRAVENOUS

## 2018-02-19 MED ORDER — OXYCODONE HCL 5 MG/5ML PO SOLN: 5.0000 mg | Freq: Once | ORAL | Status: DC | PRN

## 2018-02-19 MED ORDER — ONDANSETRON HCL 4 MG/2ML IJ SOLN
INTRAMUSCULAR | Status: AC
  Filled 2018-02-19: qty 2

## 2018-02-19 MED ORDER — FENTANYL CITRATE (PF) 250 MCG/5ML IJ SOLN
INTRAMUSCULAR | Status: AC
  Filled 2018-02-19: qty 5

## 2018-02-19 MED ORDER — LIDOCAINE 2% (20 MG/ML) 5 ML SYRINGE
INTRAMUSCULAR | Status: DC | PRN
  Administered 2018-02-19: 100 mg via INTRAVENOUS

## 2018-02-19 SURGICAL SUPPLY — 49 items
APPLIER CLIP 9.375 MED OPEN (MISCELLANEOUS)
BINDER BREAST LRG (GAUZE/BANDAGES/DRESSINGS) ×2 IMPLANT
BINDER BREAST XLRG (GAUZE/BANDAGES/DRESSINGS) IMPLANT
BLADE SURG 15 STRL LF DISP TIS (BLADE) ×1 IMPLANT
BLADE SURG 15 STRL SS (BLADE) ×1
CANISTER SUCT 3000ML PPV (MISCELLANEOUS) ×2 IMPLANT
CHLORAPREP W/TINT 26ML (MISCELLANEOUS) ×2 IMPLANT
CLIP APPLIE 9.375 MED OPEN (MISCELLANEOUS) IMPLANT
CLIP VESOCCLUDE SM WIDE 6/CT (CLIP) ×2 IMPLANT
COVER PROBE W GEL 5X96 (DRAPES) ×2 IMPLANT
COVER SURGICAL LIGHT HANDLE (MISCELLANEOUS) ×2 IMPLANT
DERMABOND ADVANCED (GAUZE/BANDAGES/DRESSINGS) ×1
DERMABOND ADVANCED .7 DNX12 (GAUZE/BANDAGES/DRESSINGS) ×1 IMPLANT
DEVICE DUBIN SPECIMEN MAMMOGRA (MISCELLANEOUS) ×2 IMPLANT
DRAPE CHEST BREAST 15X10 FENES (DRAPES) ×2 IMPLANT
DRAPE UTILITY XL STRL (DRAPES) ×2 IMPLANT
DRSG PAD ABDOMINAL 8X10 ST (GAUZE/BANDAGES/DRESSINGS) ×2 IMPLANT
ELECT COATED BLADE 2.86 ST (ELECTRODE) ×2 IMPLANT
ELECT REM PT RETURN 9FT ADLT (ELECTROSURGICAL) ×2
ELECTRODE REM PT RTRN 9FT ADLT (ELECTROSURGICAL) ×1 IMPLANT
GAUZE SPONGE 4X4 12PLY STRL (GAUZE/BANDAGES/DRESSINGS) IMPLANT
GAUZE SPONGE 4X4 12PLY STRL LF (GAUZE/BANDAGES/DRESSINGS) ×2 IMPLANT
GLOVE BIOGEL PI IND STRL 8 (GLOVE) ×1 IMPLANT
GLOVE BIOGEL PI INDICATOR 8 (GLOVE) ×1
GLOVE SS BIOGEL STRL SZ 7 (GLOVE) ×2 IMPLANT
GLOVE SUPERSENSE BIOGEL SZ 7 (GLOVE) ×2
GLOVE SURG SIGNA 7.5 PF LTX (GLOVE) ×4 IMPLANT
GOWN STRL REUS W/ TWL LRG LVL3 (GOWN DISPOSABLE) ×1 IMPLANT
GOWN STRL REUS W/ TWL XL LVL3 (GOWN DISPOSABLE) ×1 IMPLANT
GOWN STRL REUS W/TWL LRG LVL3 (GOWN DISPOSABLE) ×1
GOWN STRL REUS W/TWL XL LVL3 (GOWN DISPOSABLE) ×1
ILLUMINATOR WAVEGUIDE N/F (MISCELLANEOUS) IMPLANT
KIT BASIN OR (CUSTOM PROCEDURE TRAY) ×2 IMPLANT
KIT MARKER MARGIN INK (KITS) ×2 IMPLANT
LIGHT WAVEGUIDE WIDE FLAT (MISCELLANEOUS) IMPLANT
NEEDLE HYPO 25GX1X1/2 BEV (NEEDLE) ×2 IMPLANT
NS IRRIG 1000ML POUR BTL (IV SOLUTION) IMPLANT
PACK GENERAL/GYN (CUSTOM PROCEDURE TRAY) ×2 IMPLANT
PACK SURGICAL SETUP 50X90 (CUSTOM PROCEDURE TRAY) ×2 IMPLANT
PENCIL BUTTON HOLSTER BLD 10FT (ELECTRODE) IMPLANT
SPONGE LAP 18X18 X RAY DECT (DISPOSABLE) IMPLANT
SUT MNCRL AB 4-0 PS2 18 (SUTURE) ×2 IMPLANT
SUT VIC AB 3-0 SH 8-18 (SUTURE) ×2 IMPLANT
SYR BULB 3OZ (MISCELLANEOUS) IMPLANT
SYR CONTROL 10ML LL (SYRINGE) ×2 IMPLANT
TOWEL OR 17X24 6PK STRL BLUE (TOWEL DISPOSABLE) ×2 IMPLANT
TOWEL OR 17X26 10 PK STRL BLUE (TOWEL DISPOSABLE) IMPLANT
TUBE CONNECTING 12X1/4 (SUCTIONS) IMPLANT
YANKAUER SUCT BULB TIP NO VENT (SUCTIONS) IMPLANT

## 2018-02-19 NOTE — H&P (Signed)
Crystal Trevino  Location: [facility] Patient #: [id number] DOB: July 06, 1941 Undefined / Language: Cleophus Molt / Race: White Female  History of Present Illness   The patient is a 77 year old female who presents with a complaint of right breast cancer.  The PCP is Dr. Carlis Stable  The patient was referred by Dr. Azalee Course  The pateint is at the Breast [facility] - Oncology is Drs. Magrinat and Kinard  Her daughter, Izora Gala, is with her. Margarita Grizzle had breast cancer - treated by Margot Chimes and [place].  Her last mammogram was about 13 months ago. She felt the mass or lump in her breast. She was on Premarin which she has stopped. Both her mother and daughter had breast cancer.  Mammograms: [facility] - area of Ca++ covers 3.0 cm in the right breast Biopsy: Biopsy of the right breast on 12/21/2017 ([id number]) shows DCIS, ER - 100%, PR - 40% Family history of breast or ovarian cancer: Yes - mother and daughter. On hormone therapy: Yes, she was on Premarin, she has stopped this  I discussed the options for breast cancer treatment with the patient. The patient is at the [facility], which includes medical oncology and radiation oncology. I discussed the surgical options of lumpectomy vs. mastectomy. If mastectomy, there is the possibility of reconstruction. I discussed the options of lymph node biopsy. The treatment plan depends on the pathologic staging of the tumor and the patient's personal wishes. The risks of surgery include, but are not limited to, bleeding, infection, the need for further surgery, and nerve injury. The patient has been given literature on the treatment of breast cancer.  Plan: 1) MRI of breasts to document size of cancer, 2) get knee replaced this coming Monday, 01/04/2018, 3) Plan breast surgery - right breast lumpectomy alone - at least 4 weeks after knee replaced  Past Medical History: 1. She is to have a  knee replacement on 01/04/2018 - Dr. Ruel Favors The knee is really bothering her and she is anxious to get it replaced. Dr. Ronnie Derby has talked to her other knee later. 2. Thyroid replacement - 20+ years 3. HTN - 20+ years 4. Heart murmur 5. To get colonoscopy this year 6. History of lap chole 7. Hysterectomy for bleeding - 1987 8. Lumbar laminectomy - 1983 9.  Genetics negative.  Social History: Divorced. Has 4 children: [place], [address] yo, [place], [place], [place], 76 yo, [place], [place], 76 yo, [place], and [place], 76 yo, [place], [place]  Daughter, [name], works for Dr. Ronnie Derby.  Medication History Tawni Pummel, RN; 12/30/2017 7:28 AM) Medications Reconciled  Physical Exam  General: WN older [place] and generally healthy appearing. Skin: Inspection and palpation of the skin unremarkable.  Eyes: Conjunctivae white, pupils equal. Face, ears, nose, mouth, and throat: Face - normal. Normal ears and nose. Lips and teeth normal.  Neck: Supple. No mass. Trachea midline. No thyroid mass.  Lymph Nodes: No supraclavicular or cervical adenopathy. No axillary adenopathy.  Lungs: Normal respiratory effort. Clear to auscultation and symmetric breath sounds. Cardiovascular: Regular rate and rythm. Normal auscultation of the heart. No murmur or rub.  Breasts: right - Somewhat ptotic, bruise in the LOQ of the breast. Some fullness at 7 o'clock Left - Somewhat ptotic  Abdomen: Soft. No mass. Liver and spleen not palpable. No tenderness. No hernia. Normal bowel sounds.  Rectal: Not done.  Musculoskeletal/extremities: Normal gait. Good strength and ROM in upper and lower extremities.   Neurologic: Grossly intact  to motor and sensory function.   Psychiatric: Has normal mood and affect. Judgement and insight appear normal.  Assessment & Plan  1.  BREAST CANCER, STAGE 0, RIGHT (D05.91)  Story: Biopsy of the  right breast on 12/21/2017 ([id number]) shows DCIS, ER - 100%, PR - 40%  Oncology - Magrinat/Kinard  Plan:   1)  Addendum Note(Kaylon Laroche H. Lucia Gaskins MD; 01/04/2018 4:32 PM) MRI from 01/01/2018: 1. MRI guided biopsy is recommended for the masslike area of enhancement along the superior posterior margin of enhancement in the inferior right breast, if the patient desires breast conservation. I would recommend at least MRI guided clip placement (if not biopsy) of the anterior extent of the abnormal enhancement to delineate extent of disease.  2. On the right breast mammogram, there is a 4 mm group of indeterminate calcifications which is approximately 3.5 cm superior and anterior to the biopsy proven DCIS. If the patient desires breast conservation, stereotactic biopsy is recommended for these calcifications.  3. If invasive disease is detected in any of the above biopsies second-look ultrasound is recommended for the enlarged right axillary lymph node, with possible ultrasound biopsy if the lymph node appears abnormal.  4. If the patient decides on mastectomy rather than breast conservation, but there is need to detect invasive disease prior to surgery, I would recommend considering MRI biopsy of the mass like portion of the enhancement on MRI, and second look ultrasound with possible lymph node biopsy in the right axilla. ------------------------- Addendum Note(Pieper Kasik H. Raigan Baria MD; 02/01/2018 2:03 PM)  Right breast biopsy - 01/25/2018 - UO[facility] - fibrocystic disease  Right breast biopsy - 01/30/2108 - 1) Lower outer anterior - fibrocystic disease, 2) lower outer posterior - fibrocystic disease    3) Plan breast surgery - right breast lumpectomy alone  2.  ARTHRITIS OF KNEE, RIGHT (M17.11)  Her knee replacement was at [facility] - 01/04/2018  3.  Vaginal prolapse  She is having surgery for prolapse vagina by Dr. [facility]) on 03/15/2018  4. Thyroid replacement - 20+ years 5. HTN - 20+ years 6. Heart  murmur 7. Lumbar laminectomy - 1983   Alphonsa Overall, MD, [facility] Pager: [phone] Office phone:  [phone]

## 2018-02-19 NOTE — Interval H&P Note (Signed)
History and Physical Interval Note:  02/19/2018 10:57 AM  Crystal Trevino  has presented today for surgery, with the diagnosis of RIGHT BREAST CANCER  The various methods of treatment have been discussed with the patient and family.  Daughter and son in room.  After consideration of risks, benefits and other options for treatment, the patient has consented to  Procedure(s): BREAST LUMPECTOMY WITH RADIOACTIVE SEED LOCALIZATION. RIGHT (Right) as a surgical intervention .  The patient's history has been reviewed, patient examined, no change in status, stable for surgery.  I have reviewed the patient's chart and labs.  Questions were answered to the patient's satisfaction.     Shann Medal

## 2018-02-19 NOTE — Anesthesia Procedure Notes (Signed)
Procedure Name: LMA Insertion Date/Time: 02/19/2018 11:07 AM Performed by: Lieutenant Diego, CRNA Pre-anesthesia Checklist: Patient identified, Emergency Drugs available, Suction available and Patient being monitored Patient Re-evaluated:Patient Re-evaluated prior to induction Oxygen Delivery Method: Circle system utilized Preoxygenation: Pre-oxygenation with 100% oxygen Induction Type: IV induction Ventilation: Mask ventilation without difficulty LMA: LMA inserted LMA Size: 4.0 Number of attempts: 1 Airway Equipment and Method: Bite block Placement Confirmation: positive ETCO2 and breath sounds checked- equal and bilateral Tube secured with: Tape Dental Injury: Teeth and Oropharynx as per pre-operative assessment  Comments: By Fernanda Drum

## 2018-02-19 NOTE — Discharge Instructions (Signed)
[facility] - DISCHARGE INSTRUCTIONS TO PATIENT  Activity:  Driving - May drive in 1 to 3 days   Lifting - No lifting more than 15 pounds for 5 days, then no limit  Wound Care:   Remove bandages and may shower on Sunday  Diet:  As tolerated  Follow up appointment:  Call Dr. Pollie Friar office [facility]) at [phone] for an appointment in 2 to 3 weeks.  Medications and dosages:  Resume your home medications.  You have a prescription for:  Vicodin  Call Dr. Lucia Gaskins or his office  [phone]) if you have:  Temperature greater than 100.4,  Severe uncontrolled pain,  Redness, tenderness, or signs of infection (pain, swelling, redness, odor or green/yellow discharge around the site),  Difficulty breathing, headache or visual disturbances,  Any other questions or concerns you may have after discharge.  In an emergency, call 911 or go to an Emergency Department at a nearby hospital.

## 2018-02-19 NOTE — Transfer of Care (Signed)
Immediate Anesthesia Transfer of Care Note  Patient: Crystal Trevino  Procedure(s) Performed: BREAST LUMPECTOMY WITH RADIOACTIVE SEED LOCALIZATION. RIGHT (Right Breast)  Patient Location: PACU  Anesthesia Type:General  Level of Consciousness: awake  Airway & Oxygen Therapy: Patient Spontanous Breathing and Patient connected to face mask oxygen  Post-op Assessment: Report given to RN and Post -op Vital signs reviewed and stable  Post vital signs: Reviewed and stable  Last Vitals:  Vitals Value Taken Time  BP    Temp    Pulse 78 02/19/2018 12:16 PM  Resp 15 02/19/2018 12:16 PM  SpO2 99 % 02/19/2018 12:16 PM  Vitals shown include unvalidated device data.  Last Pain:  Vitals:   02/19/18 0900  TempSrc:   PainSc: 0-No pain      Patients Stated Pain Goal: 4 (98/33/82 5053)  Complications: No apparent anesthesia complications

## 2018-02-19 NOTE — Op Note (Signed)
02/19/2018  12:10 PM  PATIENT:  Crystal Trevino DOB: 07-30-1941 MRN: [id number]  PREOP DIAGNOSIS:   RIGHT BREAST CANCER  POSTOP DIAGNOSIS:    Right breast cancer, 6 o'clock position (Tis, N0)  PROCEDURE:   Procedure(s): BREAST LUMPECTOMY WITH RADIOACTIVE SEED LOCALIZATION. RIGHT  SURGEON:   Alphonsa Overall, M.D.  ANESTHESIA:   general  Anesthesiologist: Lyn Hollingshead, MD CRNA: Lieutenant Diego, CRNA Student Nurse Anesthetist: Rowe Robert, RN  General  EBL:  minimal  ml  DRAINS:  none   LOCAL MEDICATIONS USED:   30 cc 1/4% marcaine  SPECIMEN:   Right breast lumpectomy  COUNTS CORRECT:  YES  INDICATIONS FOR PROCEDURE:  Crystal Trevino is a 77 y.o. (DOB: February 22, 1941) white female whose primary care physician is Cari Caraway, MD and comes for right breast lumpectomy.   She was seen at the [facility] on 12/30/2017 for a right breast DCIS.  Her surgery on the breast was delayed because of previously schedule total right knee replacement on 01/05/2018.  She has done well with the right knee.   The options for breast cancer treatment have been discussed with the patient. She elected to proceed with lumpectomy.     The indications and potential complications of surgery were explained to the patient. Potential complications include, but are not limited to, bleeding, infection, the need for further surgery, and nerve injury.     She had a I131 seed placed on 02/17/2018 in her right breast at [facility].  The seed is in the 6 o'clock position of the right breast.     OPERATIVE NOTE:   The patient was taken to operating room # 9 at [facility] where she underwent a general anesthesia  supervised by Anesthesiologist: Lyn Hollingshead, MD CRNA: Lieutenant Diego, CRNA Student Nurse Anesthetist: Rowe Robert, RN. Her right breast and axilla were prepped with  ChloraPrep and sterilely draped.    A time-out and the surgical check list was reviewed.    The cancer which  was about at the 6 o'clock position of the right breast.  I made a circumareolar incision in the lower half of the areola and  I used the Neoprobe to identify the I131 seed.  I tried to excise an area around the tumor of at least 1 cm.    I excised this block of breast tissue approximately 3 cm by 4 cm  in diameter.   I painted the lumpectomy specimen with the 6 color paint kit and did a specimen mammogram which confirmed the mass, clip, and the seed were all in the right position in the specimen.  The specimen was sent to pathology who called back to confirm that they have the seed and the specimen.   I then irrigated the wound with saline. I infiltrated approximately 30 mL of 1/4% Marcaine between the incisions. I placed 4 clips to mark biopsy cavity, at 12, 3, 6, and 9 o'clock.  I then closed the incision in layers using 3-0 Vicryl sutures for the deep layer. At the skin, I closed the incisions with a 4-0 Monocryl suture. The incision wasa then painted with Dermabond.  She had gauze place over the wound and placed in a breast binder.   The patient tolerated the procedure well, was transported to the recovery room in good condition. Sponge and needle count were correct at the end of the case.   Final pathology is pending.   Alphonsa Overall, MD, [facility] Pager: [phone]  Office phone:  [phone]

## 2018-02-19 NOTE — Anesthesia Postprocedure Evaluation (Signed)
Anesthesia Post Note  Patient: Crystal Trevino  Procedure(s) Performed: BREAST LUMPECTOMY WITH RADIOACTIVE SEED LOCALIZATION. RIGHT (Right Breast)     Patient location during evaluation: PACU Anesthesia Type: General Level of consciousness: sedated Pain management: pain level controlled Vital Signs Assessment: post-procedure vital signs reviewed and stable Respiratory status: spontaneous breathing Cardiovascular status: stable Postop Assessment: no apparent nausea or vomiting Anesthetic complications: no    Last Vitals:  Vitals:   02/19/18 1216 02/19/18 1231  BP:  (!) 151/67  Pulse: 78 78  Resp:  13  Temp:    SpO2: 99% 96%    Last Pain:  Vitals:   02/19/18 1215  TempSrc:   PainSc: Asleep   Pain Goal: Patients Stated Pain Goal: 4 (02/19/18 0900)               Marsia Cino JR,JOHN Mateo Flow

## 2018-02-19 NOTE — Anesthesia Preprocedure Evaluation (Signed)
Anesthesia Evaluation  Patient identified by MRN, date of birth, ID band Patient awake    Reviewed: Allergy & Precautions, NPO status , Patient's Chart, lab work & pertinent test results  Airway Mallampati: I       Dental no notable dental hx. (+) Teeth Intact   Pulmonary neg pulmonary ROS,    Pulmonary exam normal breath sounds clear to auscultation       Cardiovascular hypertension, Pt. on home beta blockers and Pt. on medications Normal cardiovascular exam Rhythm:Regular Rate:Normal     Neuro/Psych negative neurological ROS  negative psych ROS   GI/Hepatic   Endo/Other  Hypothyroidism   Renal/GU   negative genitourinary   Musculoskeletal   Abdominal Normal abdominal exam  (+)   Peds  Hematology  (+) Blood dyscrasia, anemia ,   Anesthesia Other Findings   Reproductive/Obstetrics                             Anesthesia Physical Anesthesia Plan  ASA: II  Anesthesia Plan: General   Post-op Pain Management:    Induction: Intravenous  PONV Risk Score and Plan: 4 or greater and Ondansetron and Dexamethasone  Airway Management Planned: LMA  Additional Equipment:   Intra-op Plan:   Post-operative Plan:   Informed Consent: I have reviewed the patients History and Physical, chart, labs and discussed the procedure including the risks, benefits and alternatives for the proposed anesthesia with the patient or authorized representative who has indicated his/her understanding and acceptance.   Dental advisory given  Plan Discussed with: CRNA and Surgeon  Anesthesia Plan Comments:         Anesthesia Quick Evaluation

## 2018-02-20 ENCOUNTER — Encounter (HOSPITAL_COMMUNITY): Payer: Self-pay | Admitting: Surgery

## 2018-02-24 DIAGNOSIS — R262 Difficulty in walking, not elsewhere classified: Secondary | ICD-10-CM | POA: Diagnosis not present

## 2018-02-24 DIAGNOSIS — G8929 Other chronic pain: Secondary | ICD-10-CM | POA: Diagnosis not present

## 2018-02-24 DIAGNOSIS — M25561 Pain in right knee: Secondary | ICD-10-CM | POA: Diagnosis not present

## 2018-02-24 DIAGNOSIS — M25661 Stiffness of right knee, not elsewhere classified: Secondary | ICD-10-CM | POA: Diagnosis not present

## 2018-02-26 DIAGNOSIS — M25661 Stiffness of right knee, not elsewhere classified: Secondary | ICD-10-CM | POA: Diagnosis not present

## 2018-02-26 DIAGNOSIS — M25561 Pain in right knee: Secondary | ICD-10-CM | POA: Diagnosis not present

## 2018-02-26 DIAGNOSIS — G8929 Other chronic pain: Secondary | ICD-10-CM | POA: Diagnosis not present

## 2018-02-26 DIAGNOSIS — R262 Difficulty in walking, not elsewhere classified: Secondary | ICD-10-CM | POA: Diagnosis not present

## 2018-03-04 NOTE — Progress Notes (Signed)
Location of Breast Cancer: lower outer quadrant of the right breast  Histology per Pathology Report:   02/19/18 Diagnosis Breast, lumpectomy, right with radioactive seed - DUCTAL CARCINOMA IN SITU WITH CALCIFICATIONS, HIGH GRADE, SPANNING 3.5 CM. - THE SURGICAL RESECTION MARGINS ARE NEGATIVE FOR CARCINOMA.  01/29/18 Diagnosis 1. Breast, right, needle core biopsy, lower outer posterior - FIBROCYSTIC CHANGES. - THERE IS NO EVIDENCE OF MALIGNANCY. - SEE COMMENT. 2. Breast, right, needle core biopsy, lower outer anterior - FIBROCYSTIC CHANGES. - THERE IS NO EVIDENCE OF MALIGNANCY. - SEE COMMENT.  01/25/18 Diagnosis Breast, right, needle core biopsy, UOQ - FIBROCYTIC CHANGES WITH CALCIFICATIONS. - THERE IS NO EVIDENCE OF MALIGNANCY.  12/21/17 Diagnosis Breast, right, needle core biopsy - DUCTAL CARCINOMA IN SITU WITH CALCIFICATIONS.  Receptor Status: ER(100%), PR (40%), Her2-neu (), Ki-()  Did patient present with symptoms (if so, please note symptoms) or was this found on screening mammography?: screening mammogram  Past/Anticipated interventions by surgeon, if any: 02/19/18 - Procedure: BREAST LUMPECTOMY WITH RADIOACTIVE SEED LOCALIZATION. RIGHT;  Surgeon: Alphonsa Overall, MD  Past/Anticipated interventions by medical oncology, if any: consider anti-estrogen therapy after radiation.  Lymphedema issues, if any:  no   Pain issues, if any:  Has soreness in her right breast.  SAFETY ISSUES:  Prior radiation? no  Pacemaker/ICD? no  Possible current pregnancy?no  Is the patient on methotrexate? no  Current Complaints / other details:  Patient is here with her daughter.  BP (!) 152/71 (Patient Position: Sitting)   Pulse 72   Temp 98.6 F (37 C) (Oral)   Ht '5\' 6"'$  (1.676 m)   Wt 164 lb 12.8 oz (74.8 kg)   SpO2 100%   BMI 26.60 kg/m    Wt Readings from Last 3 Encounters:  03/08/18 164 lb 12.8 oz (74.8 kg)  02/19/18 160 lb (72.6 kg)  01/04/18 170 lb (77.1 kg)       Jacqulyn Liner, RN 03/04/2018,8:11 AM

## 2018-03-08 ENCOUNTER — Ambulatory Visit
Admission: RE | Admit: 2018-03-08 | Discharge: 2018-03-08 | Disposition: A | Discharge status: Home / Self Care | Payer: Federal, State, Local not specified - PPO | Source: Ambulatory Visit | Attending: Radiation Oncology | Admitting: Radiation Oncology

## 2018-03-08 ENCOUNTER — Encounter: Payer: Self-pay | Admitting: Radiation Oncology

## 2018-03-08 ENCOUNTER — Other Ambulatory Visit: Payer: Self-pay

## 2018-03-08 DIAGNOSIS — D0511 Intraductal carcinoma in situ of right breast: Secondary | ICD-10-CM

## 2018-03-08 DIAGNOSIS — Z79899 Other long term (current) drug therapy: Secondary | ICD-10-CM | POA: Insufficient documentation

## 2018-03-08 DIAGNOSIS — Z170 Estrogen receptor positive status [ER+]: Secondary | ICD-10-CM | POA: Diagnosis not present

## 2018-03-08 DIAGNOSIS — Z7982 Long term (current) use of aspirin: Secondary | ICD-10-CM | POA: Diagnosis not present

## 2018-03-08 DIAGNOSIS — R590 Localized enlarged lymph nodes: Secondary | ICD-10-CM | POA: Diagnosis not present

## 2018-03-08 DIAGNOSIS — Z9889 Other specified postprocedural states: Secondary | ICD-10-CM | POA: Diagnosis not present

## 2018-03-08 DIAGNOSIS — N644 Mastodynia: Secondary | ICD-10-CM | POA: Diagnosis not present

## 2018-03-08 NOTE — Progress Notes (Signed)
Radiation Oncology         [phone] ________________________________  Name: Crystal Trevino MRN: [id number]  Date: 03/08/2018  DOB: [birth year]  Re-Evaluation Note  CC: Cari Caraway, MD  Magrinat, Virgie Dad, MD    ICD-10-CM   1. Ductal carcinoma in situ (DCIS) of right breast D05.11     Diagnosis: Ductal carcinoma in situ, high-grade, estrogen and progesterone receptor positive presenting in the lower outer quadrant of the right breast  Narrative:  The patient returns today for re-evaluation since breast clinic on 12/30/17. Since clinic, the patient underwent bilateral breast MRI on 01/01/18. This showed a 3.5 cm with of suspicious enhancement in the inferior right breast. The site of biopsy is along the central inferior aspect of this enhancement. The posterior superior margin of this enhancement has a plateau enhancement and is more nodular. There is also and asymmetrically enlarged right axillary lymph node. The patient then underwent biopsy of the upper outer quadrant of the right breast on 01/25/18. This showed fibrocystic changes and no evidence of malignancy. The patient  Then underwent biopsy of the lower outer posterior and lower outer anterior right breast on 01/29/18. Both biopsies showed fibrocystic changes but no evidence of malignancy. Then the patient underwent right breast lumpectomy on 02/19/18. Final pathology revealed DCIS with calcifications, high grade, and spanning 3.5 cm. The surgical margins were negative. No lymph nodes were sampled since this was DCIS.   The patient currently reports soreness to the right breast. She denies lymphedema issues. The patient returns with her daughter to review the role of radiotherapy as part of her treatment.                           ALLERGIES:  is allergic to penicillins.  Meds: Current Outpatient Medications  Medication Sig Dispense Refill  . aspirin EC 81 MG tablet Take 81 mg by mouth daily.    . carvedilol (COREG) 12.5 MG  tablet Take 12.5 mg by mouth 2 (two) times daily with a meal.      . cholecalciferol (VITAMIN D) 1000 UNITS tablet Take 1,000 Units by mouth daily.    Marland Kitchen gabapentin (NEURONTIN) 100 MG capsule Take 1 capsule (100 mg total) by mouth at bedtime. 90 capsule 4  . levothyroxine (SYNTHROID, LEVOTHROID) 75 MCG tablet Take 75 mcg by mouth daily before breakfast.     . Multiple Vitamin (MULTIVITAMIN PO) Take 1 tablet by mouth daily.      . simvastatin (ZOCOR) 20 MG tablet Take 20 mg by mouth at bedtime.      Marland Kitchen telmisartan (MICARDIS) 20 MG tablet Take 20 mg by mouth daily.    . fluticasone (FLONASE) 50 MCG/ACT nasal spray Place 1 spray into both nostrils daily as needed for allergies or rhinitis.     No current facility-administered medications for this encounter.     Physical Findings: The patient is in no acute distress. Patient is alert and oriented.  height is 5\' 6"  (1.676 m) and weight is 164 lb 12.8 oz (74.8 kg). Her oral temperature is 98.6 F (37 C). Her blood pressure is 152/71 (abnormal) and her pulse is 72. Her oxygen saturation is 100%. .  No significant changes. Lungs are clear to auscultation bilaterally. Heart has regular rate and rhythm. No palpable cervical, supraclavicular, or axillary adenopathy. Abdomen soft, non-tender, normal bowel sounds. Right breast the patient has a periareolar scar in the lower aspect of the breast that is healing  well without signs of drainage or infection. Bruising is noted in the area.    Lab Findings: Lab Results  Component Value Date   WBC 6.4 02/19/2018   HGB 11.0 (L) 02/19/2018   HCT 34.8 (L) 02/19/2018   MCV 94.1 02/19/2018   PLT 235 02/19/2018    Radiographic Findings: No results found.  Impression: Ductal carcinoma in situ, high-grade, estrogen and progesterone receptor positive presenting in the lower outer quadrant of the right breast. Based on the size of the lesion and the high grade nature of DCIS, I would recommend radiotherapy for this  patient, despite her age.  We discussed the course of treatment, side effects, and long term toxicities with the patient and her daughter. She appears to understand and wishes to proceed with treatment. A consent form was signed and a copy was provided for the patient.   Plan:  CT simulation and treatment planning is scheduled for 03/22/18 at 3 pm. Anticipate treatment to begin the following week. The patient appears to be a good candidate for hypofractionated accelerated treatment (4 weeks).  ____________________________________  This document serves as a record of services personally performed by Gery Pray, MD. It was created on his behalf by Bethann Humble, a trained medical scribe. The creation of this record is based on the scribe's personal observations and the provider's statements to them. This document has been checked and approved by the attending provider.

## 2018-03-22 ENCOUNTER — Ambulatory Visit
Admission: RE | Admit: 2018-03-22 | Discharge: 2018-03-22 | Disposition: A | Discharge status: Home / Self Care | Payer: Federal, State, Local not specified - PPO | Source: Ambulatory Visit | Attending: Radiation Oncology | Admitting: Radiation Oncology

## 2018-03-22 DIAGNOSIS — Z170 Estrogen receptor positive status [ER+]: Secondary | ICD-10-CM | POA: Insufficient documentation

## 2018-03-22 DIAGNOSIS — D0511 Intraductal carcinoma in situ of right breast: Secondary | ICD-10-CM | POA: Diagnosis not present

## 2018-03-22 DIAGNOSIS — Z510 Encounter for antineoplastic radiation therapy: Secondary | ICD-10-CM | POA: Insufficient documentation

## 2018-03-22 NOTE — Progress Notes (Signed)
  Radiation Oncology         [phone] ________________________________  Name: Crystal Trevino MRN: [id number]  Date: 03/22/2018  DOB: 03-21-1941  SIMULATION AND TREATMENT PLANNING NOTE    ICD-10-CM   1. Ductal carcinoma in situ (DCIS) of right breast D05.11     DIAGNOSIS: Ductal carcinoma in situ, high-grade, estrogen and progesterone receptor positivepresenting in the lower outer quadrant of the right breast  NARRATIVE:  The patient was brought to the [facility].  Identity was confirmed.  All relevant records and images related to the planned course of therapy were reviewed.  The patient freely provided informed written consent to proceed with treatment after reviewing the details related to the planned course of therapy. The consent form was witnessed and verified by the simulation staff.  Then, the patient was set-up in a stable reproducible  supine position for radiation therapy.  CT images were obtained.  Surface markings were placed.  The CT images were loaded into the planning software.  Then the target and avoidance structures were contoured.  Treatment planning then occurred.  The radiation prescription was entered and confirmed.  Then, I designed and supervised the construction of a total of 3 medically necessary complex treatment devices.  I have requested : 3D Simulation  I have requested a DVH of the following structures: heart, lungs, and lumpectomy cavity.  I have ordered:dose calc.  PLAN:  The patient will receive 40.05 Gy in 15 fractions to the right breast with a 10 Gy boost in 5 fractions.   Optical Surface Tracking Plan:  Since intensity modulated radiotherapy (IMRT) and 3D conformal radiation treatment methods are predicated on accurate and precise positioning for treatment, intrafraction motion monitoring is medically necessary to ensure accurate and safe treatment delivery.  The ability to quantify intrafraction motion without excessive ionizing  radiation dose can only be performed with optical surface tracking. Accordingly, surface imaging offers the opportunity to obtain 3D measurements of patient position throughout IMRT and 3D treatments without excessive radiation exposure.  I am ordering optical surface tracking for this patient's upcoming course of radiotherapy. ________________________________   -----------------------------------  Blair Promise, PhD, MD  This document serves as a record of services personally performed by Gery Pray, MD. It was created on his behalf by Bethann Humble, a trained medical scribe. The creation of this record is based on the scribe's personal observations and the provider's statements to them. This document has been checked and approved by the attending provider.

## 2018-03-25 DIAGNOSIS — Z510 Encounter for antineoplastic radiation therapy: Secondary | ICD-10-CM | POA: Diagnosis not present

## 2018-03-25 DIAGNOSIS — Z170 Estrogen receptor positive status [ER+]: Secondary | ICD-10-CM | POA: Diagnosis not present

## 2018-03-25 DIAGNOSIS — D0511 Intraductal carcinoma in situ of right breast: Secondary | ICD-10-CM | POA: Diagnosis not present

## 2018-03-26 NOTE — Progress Notes (Signed)
[name]  [phone] [phone]     ID: Crystal Trevino DOB: March 29, 1941  MR#: [id number]  OVF#:[id number]  Patient Care Team: Cari Caraway, MD as PCP - General (Family Medicine) Wilmer Santillo, Virgie Dad, MD as Consulting Physician (Oncology) Vickey Huger, MD as Consulting Physician (Orthopedic Surgery) Marti Sleigh, MD as Consulting Physician (Gynecology) Gery Pray, MD as Consulting Physician (Radiation Oncology) Alphonsa Overall, MD as Consulting Physician (General Surgery) Marti Sleigh, MD as Consulting Physician (Gynecology) OTHER MD:  CHIEF COMPLAINT: Estrogen receptor positive DCIS  CURRENT TREATMENT: Adjuvant radiation   HISTORY OF CURRENT ILLNESS: From the original intake note:  "Crystal Trevino" had routine screening mammography on 12/09/2017 showing a possible abnormality in the right breast. She underwent bilateral diagnostic mammography with tomography and right breast ultrasonography at [facility] on 12/18/2017 the breast density to be category C.  There was an area of grouped heterogeneous calcifications in the lower outer quadrant but no significant masses ultrasonography found no abnormalities in the right axilla.    Accordingly on 12/21/2017 she proceeded to biopsy of the right breast area in question. 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  The patient's subsequent history is as detailed below.  INTERVAL HISTORY: Crystal Trevino returns today for follow up and treatment of her estrogen receptor positive breast cancer accompanied by her daugther. She is having a lot of intense night hot flashes that wake her up at night. She was prescribed gabapentin.     Since her last visit she underwent right breast lumpectomy ([id number]) on 02/19/2018 showing: High grade  DCIS with calcifications spanning 3.5 cm. Negative margins.   She is currently receiving radiation treatment.   REVIEW OF SYSTEMS: Crystal Trevino reports that she had a lot of pain a few days after surgery. She is no longer feeling significant pain or soreness. The excisional area is sensitive. For exercise, she rides a recumbent bike for 15-20 minutes every dayand does ball exercises for her knee. She denies unusual headaches, visual changes, nausea, vomiting, or dizziness. There has been no unusual cough, phlegm production, or pleurisy. This been no change in bowel or bladder habits. She denies unexplained fatigue or unexplained weight loss, bleeding, rash, or fever. A detailed review of systems was otherwise stable.     PAST MEDICAL HISTORY: Past Medical History:  Diagnosis Date  . Arthritis   . Breast CA ([place])    right breast cancer  . Cancer ([place])    HAD RIGHT BREAST BX --GOING TO CANCER CENTER ON WED 12/30/2017  . Constipation 08/13/2011  . Dizziness 08/13/2011  . Family history of adverse reaction to anesthesia    pt mother had PONV  . Family history of breast cancer   . Genetic testing 01/28/2018   Multi-Cancer panel (83 genes) @ [facility] - No pathogenic mutations detected  . GERD (gastroesophageal reflux disease)   . Heart murmur    "SLIGHT HEART MURMUR"  WAS PICKED UP AS AN ADULT  . Hyperlipidemia   . Hypertension   . Hypothyroidism   . Osteoarthritis of knee    Right  . Peripheral vascular disease ([place])   . Reflux   . Splenic artery aneurysm ([facility])    stable 1.5cm by 08/2013 CT; no further surveillance recommended  . Weight loss     PAST SURGICAL HISTORY: Past Surgical History:  Procedure Laterality Date  . ABDOMINAL HYSTERECTOMY    .  BACK SURGERY    . BREAST LUMPECTOMY WITH RADIOACTIVE SEED LOCALIZATION Right 02/19/2018   Procedure: BREAST LUMPECTOMY WITH RADIOACTIVE SEED LOCALIZATION. RIGHT;  Surgeon: Alphonsa Overall, MD;  Location: [facility];  Service: General;  Laterality:  Right;  . BREAST SURGERY    . CHOLECYSTECTOMY    . KNEE SURGERY    . LUMBAR LAMINECTOMY    . TOTAL KNEE ARTHROPLASTY Right 01/04/2018  . TOTAL KNEE ARTHROPLASTY Right 01/04/2018   Procedure: TOTAL KNEE ARTHROPLASTY;  Surgeon: Vickey Huger, MD;  Location: [facility];  Service: Orthopedics;  Laterality: Right;  Marland Kitchen VAGINAL PROLAPSE REPAIR     2015   DONE IN [place]  Bilateral cataract extraction, Gall bladder removal. Hysterectomy with USO and remaining ovary was later removed.  FAMILY HISTORY Family History  Problem Relation Age of Onset  . Heart disease Mother   . Breast cancer Mother 35       deceased 74; TAH/BSO (age?)  . Heart disease Father   . Breast cancer Daughter        dx 35s; currenlty 64; Neg BRCA1/BRCA2  . Breast cancer Maternal Aunt 49       deceased 66  Her father passed from heart complications at age 47. Her mother also passed away from heart complications at age 37. She notes that her mother had breast cancer and was diagnosed in her 75's. Her daughter, Crystal Trevino was diagnosed with breast cancer in her 64's. Her daughter was genetically testing in 2011. She denies a family history of ovarian cancer.  GYNECOLOGIC HISTORY:  No LMP recorded. Patient has had a hysterectomy. Menarche: [age] years old Age at first live birth: 77 years old GXP4 LMP: 1987 Contraceptive: oral pills with no complications HRT: Premarin since 1987 until January 2019 Hysterectomy with USO and the remaining ovary was later removed.    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 She is divorced, she shares her home and expenses with a friend, named Crystal Trevino.  The patient's son, Crystal Trevino, works in Insurance underwriter in [place], [place]. Her daughter, Crystal Trevino, is a Nutritional therapist at [employer].  The patient's daughter, Crystal Trevino, works in an office in [place].  Patient's son, Crystal Trevino, is a Customer service manager in [place], [place]. She has 4 grandchildren and 1 step grandchild.   ADVANCED DIRECTIVES:  Crystal Trevino [phone]   HEALTH MAINTENANCE: Social History   Tobacco Use  . Smoking status: Never Smoker  . Smokeless tobacco: Never Used  Substance Use Topics  . Alcohol use: No  . Drug use: No     Colonoscopy: [facility] due this year  PAP:   B[facility] density: March 2013 at [facility] T score -0.6 normal   Allergies  Allergen Reactions  . Penicillins Nausea And Vomiting, Rash and Other (See Comments)    PATIENT HAS HAD A PCN REACTION WITH IMMEDIATE RASH, FACIAL/TONGUE/THROAT SWELLING, SOB, OR LIGHTHEADEDNESS WITH HYPOTENSION:  #  #  #  YES  #  #  #   Has patient had a PCN reaction causing severe rash involving mucus membranes or skin necrosis: No Has patient had a PCN reaction that required hospitalization: No Has patient had a PCN reaction occurring within the last 10 years: No     Current Outpatient Medications  Medication Sig Dispense Refill  . aspirin EC 81 MG tablet Take 81 mg by mouth daily.    . carvedilol (COREG) 12.5 MG tablet Take 12.5 mg by mouth 2 (two) times daily with a meal.      .  cholecalciferol (VITAMIN D) 1000 UNITS tablet Take 1,000 Units by mouth daily.    . fluticasone (FLONASE) 50 MCG/ACT nasal spray Place 1 spray into both nostrils daily as needed for allergies or rhinitis.    Marland Kitchen gabapentin (NEURONTIN) 100 MG capsule Take 1 capsule (100 mg total) by mouth at bedtime. 90 capsule 4  . levothyroxine (SYNTHROID, LEVOTHROID) 75 MCG tablet Take 75 mcg by mouth daily before breakfast.     . Multiple Vitamin (MULTIVITAMIN PO) Take 1 tablet by mouth daily.      . simvastatin (ZOCOR) 20 MG tablet Take 20 mg by mouth at bedtime.      Marland Kitchen telmisartan (MICARDIS) 20 MG tablet Take 20 mg by mouth daily.     No current facility-administered medications for this visit.     OBJECTIVE: Older white woman who appears stated age  64:   03/29/18 1406  BP: (!) 170/71  Pulse: 70  Resp: 17  Temp: 98.1 F (36.7 C)  SpO2: 100%     Body mass index is 26.83 kg/m.   Wt  Readings from Last 3 Encounters:  03/29/18 166 lb 3.2 oz (75.4 kg)  03/08/18 164 lb 12.8 oz (74.8 kg)  02/19/18 160 lb (72.6 kg)      ECOG FS:1 - Symptomatic but completely ambulatory  Sclerae unicteric, pupils round and equal Oropharynx clear and moist No cervical or supraclavicular adenopathy Lungs no rales or rhonchi Heart regular rate and rhythm Abd soft, nontender, positive bowel sounds MSK no focal spinal tenderness, no upper extremity lymphedema Neuro: nonfocal, well oriented, appropriate affect Breasts: The right breast is status post recent lumpectomy.  The cosmetic result is excellent.  There is no swelling, erythema, or dehiscence.  The left breast is benign.  Both axillae are benign.   LAB RESULTS:  CMP     Component Value Date/Time   NA 137 02/19/2018 0924   K 4.1 02/19/2018 0924   CL 104 02/19/2018 0924   CO2 24 02/19/2018 0924   GLUCOSE 92 02/19/2018 0924   BUN 15 02/19/2018 0924   CREATININE 0.77 02/19/2018 0924   CREATININE 0.94 08/26/2013 1710   CALCIUM 9.2 02/19/2018 0924   PROT 6.6 12/28/2017 1004   ALBUMIN 4.0 12/28/2017 1004   AST 23 12/28/2017 1004   ALT 18 12/28/2017 1004   ALKPHOS 59 12/28/2017 1004   BILITOT 1.3 (H) 12/28/2017 1004   GFRNONAA >60 02/19/2018 0924   GFRAA >60 02/19/2018 0924    No results found for: TOTALPROTELP, ALBUMINELP, A1GS, A2GS, BETS, BETA2SER, GAMS, MSPIKE, SPEI  No results found for: KPAFRELGTCHN, LAMBDASER, KAPLAMBRATIO  Lab Results  Component Value Date   WBC 6.4 02/19/2018   NEUTROABS 4.4 12/28/2017   HGB 11.0 (L) 02/19/2018   HCT 34.8 (L) 02/19/2018   MCV 94.1 02/19/2018   PLT 235 02/19/2018    '@LASTCHEMISTRY'$ @  No results found for: LABCA2  No components found for: PPIRJJ884  No results for input(s): INR in the last 168 hours.  No results found for: LABCA2  No results found for: ZYS063  No results found for: KZS010  No results found for: XNA355  No results found for: CA2729  No components  found for: HGQUANT  No results found for: CEA1 / No results found for: CEA1   No results found for: AFPTUMOR  No results found for: CHROMOGRNA  No results found for: PSA1  No visits with results within 3 Day(s) from this visit.  Latest known visit with results is:  Admission on  02/19/2018, Discharged on 02/19/2018  Component Date Value Ref Range Status  . Sodium 02/19/2018 137  135 - 145 mmol/L Final  . Potassium 02/19/2018 4.1  3.5 - 5.1 mmol/L Final  . Chloride 02/19/2018 104  101 - 111 mmol/L Final  . CO2 02/19/2018 24  22 - 32 mmol/L Final  . Glucose, Bld 02/19/2018 92  65 - 99 mg/dL Final  . BUN 02/19/2018 15  6 - 20 mg/dL Final  . Creatinine, Ser 02/19/2018 0.77  0.44 - 1.00 mg/dL Final  . Calcium 02/19/2018 9.2  8.9 - 10.3 mg/dL Final  . GFR calc non Af Amer 02/19/2018 >60  >60 mL/min Final  . GFR calc Af Amer 02/19/2018 >60  >60 mL/min Final   Comment: (NOTE) The eGFR has been calculated using the CKD EPI equation. This calculation has not been validated in all clinical situations. eGFR's persistently <60 mL/min signify possible Chronic Kidney Disease.   Georgiann Hahn gap 02/19/2018 9  5 - 15 Final   Performed at [facility], [place] [address], [place], [place] [address]  . WBC 02/19/2018 6.4  4.0 - 10.5 K/uL Final  . RBC 02/19/2018 3.70* 3.87 - 5.11 MIL/uL Final  . Hemoglobin 02/19/2018 11.0* 12.0 - 15.0 g/dL Final  . HCT 02/19/2018 34.8* 36.0 - 46.0 % Final  . MCV 02/19/2018 94.1  78.0 - 100.0 fL Final  . MCH 02/19/2018 29.7  26.0 - 34.0 pg Final  . MCHC 02/19/2018 31.6  30.0 - 36.0 g/dL Final  . RDW 02/19/2018 14.3  11.5 - 15.5 % Final  . Platelets 02/19/2018 235  150 - 400 K/uL Final   Performed at [facility], [place] [address], [place], [place] [address]    (this displays the last labs from the last 3 days)  No results found for: TOTALPROTELP, ALBUMINELP, A1GS, A2GS, BETS, BETA2SER, GAMS, MSPIKE, SPEI (this displays SPEP labs)  No results found for:  KPAFRELGTCHN, LAMBDASER, KAPLAMBRATIO (kappa/lambda light chains)  No results found for: HGBA, HGBA2QUANT, HGBFQUANT, HGBSQUAN (Hemoglobinopathy evaluation)   No results found for: LDH  No results found for: IRON, TIBC, IRONPCTSAT (Iron and TIBC)  No results found for: FERRITIN  Urinalysis No results found for: COLORURINE, APPEARANCEUR, LABSPEC, PHURINE, GLUCOSEU, HGBUR, BILIRUBINUR, KETONESUR, PROTEINUR, UROBILINOGEN, NITRITE, LEUKOCYTESUR   STUDIES: Pathology results reviewed with the patient  ELIGIBLE FOR AVAILABLE RESEARCH PROTOCOL: no  ASSESSMENT: 77 y.o. [place], [place] Woman status post right breast lower outer quadrant biopsy on 12/21/2017 for a ductal carcinoma in situ, high-grade, estrogen and progesterone receptor positive  (1) genetics testing 01/28/2018 through  [facility]'s Multi-Cancer panel found no deleterious mutations in (ALK, APC, ATM, AXIN2, BAP1, BARD1, BLM, BMPR1A, BRCA1, BRCA2, BRIP1, CASR, CDC73, CDH1, CDK4, CDKN1B, CDKN1C, CDKN2A, CEBPA, CHEK2, CTNNA1, DICER1, DIS3L2, EGFR, EPCAM, FH, FLCN, GATA2, GPC3, GREM1, HOXB13, HRAS, KIT, MAX, MEN1, MET, MITF, MLH1, MSH2, MSH3, MSH6, MUTYH, NBN, NF1, NF2, NTHL1, PALB2, PDGFRA, PHOX2B, PMS2, POLD1, POLE, POT1, PRKAR1A, PTCH1, PTEN, RAD50, RAD51C, RAD51D, RB1, RECQL4, RET, RUNX1, SDHA, SDHAF2, SDHB, SDHC, SDHD, SMAD4, SMARCA4, SMARCB1, SMARCE1, STK11, SUFU, TERC, TERT, TMEM127, TP53, TSC1, TSC2, VHL, WRN, WT1).  (2) status post right lumpectomy 02/19/2018 showing ductal carcinoma in situ, high-grade, measuring 3.5 cm, with negative margins.  (3) adjuvant radiation to be completed 04/25/2018  (4) consider adjuvant hormone therapy  PLAN: Crystal Trevino did very well with her surgery and hopefully she will do equally well with her radiation.  S really made a difference is getting her knee done.  She can out walk her daughter.  She is also exercising regularly which will help her through the radiation treatments in terms of avoiding  fatigue  She is having significant hot flashes.  She really would like to be back on hormone replacement.  I do not recommend that.  She is on gabapentin 100 mg at bedtime and it helps a little.  She is going to try to increase her gabapentin to 300 mg nightly and if that is helpful and well-tolerated she will let us know and I will write her 300 mg tablet.  She will see me again in June or July.  At that time we will consider tamoxifen, versus aromatase inhibitors versus observation.  She knows to call for any problems that may develop before the next visit.  Rashena Dowling, Virgie Dad, MD  03/29/18 2:42 PM Medical Oncology and Hematology [facility] [address] [place], [place] [address] Tel. [phone]    Fax. [phone]  This document serves as a record of services personally performed by Lurline Del, MD. It was created on his behalf by Sheron Nightingale, a trained medical scribe. The creation of this record is based on the scribe's personal observations and the provider's statements to them.   I have reviewed the above documentation for accuracy and completeness, and I agree with the above.

## 2018-03-29 ENCOUNTER — Telehealth: Payer: Self-pay | Admitting: Oncology

## 2018-03-29 ENCOUNTER — Inpatient Hospital Stay: Payer: Federal, State, Local not specified - PPO | Attending: Oncology | Admitting: Oncology

## 2018-03-29 ENCOUNTER — Ambulatory Visit
Admission: RE | Admit: 2018-03-29 | Discharge: 2018-03-29 | Disposition: A | Discharge status: Home / Self Care | Payer: Federal, State, Local not specified - PPO | Source: Ambulatory Visit | Attending: Radiation Oncology | Admitting: Radiation Oncology

## 2018-03-29 VITALS — BP 170/71 | HR 70 | Temp 98.1°F | Resp 17 | Ht 66.0 in | Wt 166.2 lb

## 2018-03-29 DIAGNOSIS — D0511 Intraductal carcinoma in situ of right breast: Secondary | ICD-10-CM | POA: Insufficient documentation

## 2018-03-29 DIAGNOSIS — Z170 Estrogen receptor positive status [ER+]: Secondary | ICD-10-CM | POA: Insufficient documentation

## 2018-03-29 DIAGNOSIS — N951 Menopausal and female climacteric states: Secondary | ICD-10-CM | POA: Diagnosis not present

## 2018-03-29 DIAGNOSIS — Z510 Encounter for antineoplastic radiation therapy: Secondary | ICD-10-CM | POA: Diagnosis not present

## 2018-03-29 NOTE — Telephone Encounter (Signed)
Gave patient AVs and calendar of upcoming July appointments.  °

## 2018-03-29 NOTE — Progress Notes (Signed)
  Radiation Oncology         [phone] ________________________________  Name: Crystal Trevino MRN: [id number]  Date: 03/29/2018  DOB: Apr 12, 1941  Simulation Verification Note    ICD-10-CM   1. Ductal carcinoma in situ (DCIS) of right breast D05.11     Status: outpatient  NARRATIVE: The patient was brought to the treatment unit and placed in the planned treatment position. The clinical setup was verified. Then port films were obtained and uploaded to the radiation oncology medical record software.  The treatment beams were carefully compared against the planned radiation fields. The position location and shape of the radiation fields was reviewed. They targeted volume of tissue appears to be appropriately covered by the radiation beams. Organs at risk appear to be excluded as planned.  Based on my personal review, I approved the simulation verification. The patient's treatment will proceed as planned.  -----------------------------------  Blair Promise, PhD, MD

## 2018-03-30 ENCOUNTER — Ambulatory Visit
Admission: RE | Admit: 2018-03-30 | Discharge: 2018-03-30 | Disposition: A | Discharge status: Home / Self Care | Payer: Federal, State, Local not specified - PPO | Source: Ambulatory Visit | Attending: Radiation Oncology | Admitting: Radiation Oncology

## 2018-03-30 DIAGNOSIS — D0511 Intraductal carcinoma in situ of right breast: Secondary | ICD-10-CM | POA: Diagnosis not present

## 2018-03-30 DIAGNOSIS — Z170 Estrogen receptor positive status [ER+]: Secondary | ICD-10-CM | POA: Diagnosis not present

## 2018-03-30 DIAGNOSIS — Z510 Encounter for antineoplastic radiation therapy: Secondary | ICD-10-CM | POA: Diagnosis not present

## 2018-03-30 MED ORDER — ALRA NON-METALLIC DEODORANT (RAD-ONC)
1.0000 "application " | Freq: Once | TOPICAL | Status: AC
  Administered 2018-03-30: 1 via TOPICAL

## 2018-03-30 MED ORDER — RADIAPLEXRX EX GEL
Freq: Once | CUTANEOUS | Status: AC
  Administered 2018-03-30: 15:00:00 via TOPICAL

## 2018-03-30 NOTE — Progress Notes (Signed)
Pt here for patient teaching.  Pt given Radiation and You booklet, skin care instructions, Alra deodorant and Radiaplex gel.  Reviewed areas of pertinence such as hair loss, skin changes, breast tenderness and breast swelling . Pt able to give teach back of to pat skin, use unscented/gentle soap and drink plenty of water,apply Radiaplex bid, avoid applying anything to skin within 4 hours of treatment, avoid wearing an under wire bra and to use an electric razor if they must shave. Pt demonstrated understanding and verbalizes understanding of information given and will contact nursing with any questions or concerns.     [email or web address]/treatmentinformation/whattoexpect/index

## 2018-03-31 ENCOUNTER — Ambulatory Visit
Admission: RE | Admit: 2018-03-31 | Discharge: 2018-03-31 | Disposition: A | Discharge status: Home / Self Care | Payer: Federal, State, Local not specified - PPO | Source: Ambulatory Visit | Attending: Radiation Oncology | Admitting: Radiation Oncology

## 2018-03-31 DIAGNOSIS — D0511 Intraductal carcinoma in situ of right breast: Secondary | ICD-10-CM | POA: Insufficient documentation

## 2018-03-31 DIAGNOSIS — Z170 Estrogen receptor positive status [ER+]: Secondary | ICD-10-CM | POA: Insufficient documentation

## 2018-03-31 DIAGNOSIS — Z510 Encounter for antineoplastic radiation therapy: Secondary | ICD-10-CM | POA: Diagnosis not present

## 2018-04-01 ENCOUNTER — Ambulatory Visit
Admission: RE | Admit: 2018-04-01 | Discharge: 2018-04-01 | Disposition: A | Discharge status: Home / Self Care | Payer: Federal, State, Local not specified - PPO | Source: Ambulatory Visit | Attending: Radiation Oncology | Admitting: Radiation Oncology

## 2018-04-01 DIAGNOSIS — Z510 Encounter for antineoplastic radiation therapy: Secondary | ICD-10-CM | POA: Diagnosis not present

## 2018-04-01 DIAGNOSIS — Z170 Estrogen receptor positive status [ER+]: Secondary | ICD-10-CM | POA: Diagnosis not present

## 2018-04-01 DIAGNOSIS — D0511 Intraductal carcinoma in situ of right breast: Secondary | ICD-10-CM | POA: Diagnosis not present

## 2018-04-02 ENCOUNTER — Ambulatory Visit
Admission: RE | Admit: 2018-04-02 | Discharge: 2018-04-02 | Disposition: A | Discharge status: Home / Self Care | Payer: Federal, State, Local not specified - PPO | Source: Ambulatory Visit | Attending: Radiation Oncology | Admitting: Radiation Oncology

## 2018-04-02 DIAGNOSIS — D0511 Intraductal carcinoma in situ of right breast: Secondary | ICD-10-CM | POA: Diagnosis not present

## 2018-04-02 DIAGNOSIS — Z170 Estrogen receptor positive status [ER+]: Secondary | ICD-10-CM | POA: Diagnosis not present

## 2018-04-02 DIAGNOSIS — Z510 Encounter for antineoplastic radiation therapy: Secondary | ICD-10-CM | POA: Diagnosis not present

## 2018-04-05 ENCOUNTER — Ambulatory Visit
Admission: RE | Admit: 2018-04-05 | Discharge: 2018-04-05 | Disposition: A | Discharge status: Home / Self Care | Payer: Federal, State, Local not specified - PPO | Source: Ambulatory Visit | Attending: Radiation Oncology | Admitting: Radiation Oncology

## 2018-04-05 DIAGNOSIS — Z170 Estrogen receptor positive status [ER+]: Secondary | ICD-10-CM | POA: Diagnosis not present

## 2018-04-05 DIAGNOSIS — D0511 Intraductal carcinoma in situ of right breast: Secondary | ICD-10-CM | POA: Diagnosis not present

## 2018-04-05 DIAGNOSIS — Z510 Encounter for antineoplastic radiation therapy: Secondary | ICD-10-CM | POA: Diagnosis not present

## 2018-04-06 ENCOUNTER — Ambulatory Visit
Admission: RE | Admit: 2018-04-06 | Discharge: 2018-04-06 | Disposition: A | Discharge status: Home / Self Care | Payer: Federal, State, Local not specified - PPO | Source: Ambulatory Visit | Attending: Radiation Oncology | Admitting: Radiation Oncology

## 2018-04-06 DIAGNOSIS — D0511 Intraductal carcinoma in situ of right breast: Secondary | ICD-10-CM | POA: Diagnosis not present

## 2018-04-06 DIAGNOSIS — Z510 Encounter for antineoplastic radiation therapy: Secondary | ICD-10-CM | POA: Diagnosis not present

## 2018-04-06 DIAGNOSIS — Z170 Estrogen receptor positive status [ER+]: Secondary | ICD-10-CM | POA: Diagnosis not present

## 2018-04-07 ENCOUNTER — Ambulatory Visit
Admission: RE | Admit: 2018-04-07 | Discharge: 2018-04-07 | Disposition: A | Discharge status: Home / Self Care | Payer: Federal, State, Local not specified - PPO | Source: Ambulatory Visit | Attending: Radiation Oncology | Admitting: Radiation Oncology

## 2018-04-07 DIAGNOSIS — Z170 Estrogen receptor positive status [ER+]: Secondary | ICD-10-CM | POA: Diagnosis not present

## 2018-04-07 DIAGNOSIS — Z510 Encounter for antineoplastic radiation therapy: Secondary | ICD-10-CM | POA: Diagnosis not present

## 2018-04-07 DIAGNOSIS — D0511 Intraductal carcinoma in situ of right breast: Secondary | ICD-10-CM | POA: Diagnosis not present

## 2018-04-08 ENCOUNTER — Ambulatory Visit
Admission: RE | Admit: 2018-04-08 | Discharge: 2018-04-08 | Disposition: A | Discharge status: Home / Self Care | Payer: Federal, State, Local not specified - PPO | Source: Ambulatory Visit | Attending: Radiation Oncology | Admitting: Radiation Oncology

## 2018-04-08 DIAGNOSIS — Z170 Estrogen receptor positive status [ER+]: Secondary | ICD-10-CM | POA: Diagnosis not present

## 2018-04-08 DIAGNOSIS — D0511 Intraductal carcinoma in situ of right breast: Secondary | ICD-10-CM | POA: Diagnosis not present

## 2018-04-08 DIAGNOSIS — Z510 Encounter for antineoplastic radiation therapy: Secondary | ICD-10-CM | POA: Diagnosis not present

## 2018-04-09 ENCOUNTER — Ambulatory Visit
Admission: RE | Admit: 2018-04-09 | Discharge: 2018-04-09 | Disposition: A | Discharge status: Home / Self Care | Payer: Federal, State, Local not specified - PPO | Source: Ambulatory Visit | Attending: Radiation Oncology | Admitting: Radiation Oncology

## 2018-04-09 DIAGNOSIS — D0511 Intraductal carcinoma in situ of right breast: Secondary | ICD-10-CM | POA: Diagnosis not present

## 2018-04-09 DIAGNOSIS — Z510 Encounter for antineoplastic radiation therapy: Secondary | ICD-10-CM | POA: Diagnosis not present

## 2018-04-09 DIAGNOSIS — Z170 Estrogen receptor positive status [ER+]: Secondary | ICD-10-CM | POA: Diagnosis not present

## 2018-04-12 ENCOUNTER — Ambulatory Visit
Admission: RE | Admit: 2018-04-12 | Discharge: 2018-04-12 | Disposition: A | Discharge status: Home / Self Care | Payer: Federal, State, Local not specified - PPO | Source: Ambulatory Visit | Attending: Radiation Oncology | Admitting: Radiation Oncology

## 2018-04-12 DIAGNOSIS — Z170 Estrogen receptor positive status [ER+]: Secondary | ICD-10-CM | POA: Diagnosis not present

## 2018-04-12 DIAGNOSIS — Z510 Encounter for antineoplastic radiation therapy: Secondary | ICD-10-CM | POA: Diagnosis not present

## 2018-04-12 DIAGNOSIS — D0511 Intraductal carcinoma in situ of right breast: Secondary | ICD-10-CM | POA: Diagnosis not present

## 2018-04-13 ENCOUNTER — Ambulatory Visit
Admission: RE | Admit: 2018-04-13 | Discharge: 2018-04-13 | Disposition: A | Discharge status: Home / Self Care | Payer: Federal, State, Local not specified - PPO | Source: Ambulatory Visit | Attending: Radiation Oncology | Admitting: Radiation Oncology

## 2018-04-13 DIAGNOSIS — D0511 Intraductal carcinoma in situ of right breast: Secondary | ICD-10-CM | POA: Diagnosis not present

## 2018-04-13 DIAGNOSIS — Z170 Estrogen receptor positive status [ER+]: Secondary | ICD-10-CM | POA: Diagnosis not present

## 2018-04-13 DIAGNOSIS — Z510 Encounter for antineoplastic radiation therapy: Secondary | ICD-10-CM | POA: Diagnosis not present

## 2018-04-13 NOTE — Progress Notes (Signed)
.  Simulation verification  The patient was brought to the treatment machine and placed in the plan treatment position.  Clinical set up was verified to ensure that the target region is appropriately covered for the patient's upcoming electron boost treatment.  The targeted volume of tissue is appropriately covered by the radiation field.  Based on my personal review, I approve the simulation verification.  The patient's treatment will proceed as planned.  ------------------------------------------------  -----------------------------------  Mj Willis D. Javier Mamone, PhD, MD  

## 2018-04-14 ENCOUNTER — Ambulatory Visit
Admission: RE | Admit: 2018-04-14 | Discharge: 2018-04-14 | Disposition: A | Discharge status: Home / Self Care | Payer: Federal, State, Local not specified - PPO | Source: Ambulatory Visit | Attending: Radiation Oncology | Admitting: Radiation Oncology

## 2018-04-14 DIAGNOSIS — D0511 Intraductal carcinoma in situ of right breast: Secondary | ICD-10-CM | POA: Diagnosis not present

## 2018-04-14 DIAGNOSIS — Z170 Estrogen receptor positive status [ER+]: Secondary | ICD-10-CM | POA: Diagnosis not present

## 2018-04-14 DIAGNOSIS — Z510 Encounter for antineoplastic radiation therapy: Secondary | ICD-10-CM | POA: Diagnosis not present

## 2018-04-15 ENCOUNTER — Ambulatory Visit
Admission: RE | Admit: 2018-04-15 | Discharge: 2018-04-15 | Disposition: A | Discharge status: Home / Self Care | Payer: Federal, State, Local not specified - PPO | Source: Ambulatory Visit | Attending: Radiation Oncology | Admitting: Radiation Oncology

## 2018-04-15 DIAGNOSIS — Z510 Encounter for antineoplastic radiation therapy: Secondary | ICD-10-CM | POA: Diagnosis not present

## 2018-04-15 DIAGNOSIS — D0511 Intraductal carcinoma in situ of right breast: Secondary | ICD-10-CM | POA: Diagnosis not present

## 2018-04-15 DIAGNOSIS — Z170 Estrogen receptor positive status [ER+]: Secondary | ICD-10-CM | POA: Diagnosis not present

## 2018-04-16 ENCOUNTER — Ambulatory Visit
Admission: RE | Admit: 2018-04-16 | Discharge: 2018-04-16 | Disposition: A | Discharge status: Home / Self Care | Payer: Federal, State, Local not specified - PPO | Source: Ambulatory Visit | Attending: Radiation Oncology | Admitting: Radiation Oncology

## 2018-04-16 DIAGNOSIS — Z510 Encounter for antineoplastic radiation therapy: Secondary | ICD-10-CM | POA: Diagnosis not present

## 2018-04-16 DIAGNOSIS — D0511 Intraductal carcinoma in situ of right breast: Secondary | ICD-10-CM | POA: Diagnosis not present

## 2018-04-16 DIAGNOSIS — Z170 Estrogen receptor positive status [ER+]: Secondary | ICD-10-CM | POA: Diagnosis not present

## 2018-04-19 ENCOUNTER — Ambulatory Visit
Admission: RE | Admit: 2018-04-19 | Discharge: 2018-04-19 | Disposition: A | Discharge status: Home / Self Care | Payer: Federal, State, Local not specified - PPO | Source: Ambulatory Visit | Attending: Radiation Oncology | Admitting: Radiation Oncology

## 2018-04-19 DIAGNOSIS — Z510 Encounter for antineoplastic radiation therapy: Secondary | ICD-10-CM | POA: Diagnosis not present

## 2018-04-19 DIAGNOSIS — D0511 Intraductal carcinoma in situ of right breast: Secondary | ICD-10-CM | POA: Diagnosis not present

## 2018-04-19 DIAGNOSIS — Z170 Estrogen receptor positive status [ER+]: Secondary | ICD-10-CM | POA: Diagnosis not present

## 2018-04-20 ENCOUNTER — Ambulatory Visit
Admission: RE | Admit: 2018-04-20 | Discharge: 2018-04-20 | Disposition: A | Discharge status: Home / Self Care | Payer: Federal, State, Local not specified - PPO | Source: Ambulatory Visit | Attending: Radiation Oncology | Admitting: Radiation Oncology

## 2018-04-20 DIAGNOSIS — D0511 Intraductal carcinoma in situ of right breast: Secondary | ICD-10-CM

## 2018-04-20 DIAGNOSIS — Z510 Encounter for antineoplastic radiation therapy: Secondary | ICD-10-CM | POA: Diagnosis not present

## 2018-04-20 DIAGNOSIS — Z170 Estrogen receptor positive status [ER+]: Secondary | ICD-10-CM | POA: Diagnosis not present

## 2018-04-20 MED ORDER — SONAFINE EX EMUL
1.0000 "application " | Freq: Two times a day (BID) | CUTANEOUS | Status: DC
  Administered 2018-04-20: 1 via TOPICAL

## 2018-04-21 ENCOUNTER — Ambulatory Visit
Admission: RE | Admit: 2018-04-21 | Discharge: 2018-04-21 | Disposition: A | Discharge status: Home / Self Care | Payer: Federal, State, Local not specified - PPO | Source: Ambulatory Visit | Attending: Radiation Oncology | Admitting: Radiation Oncology

## 2018-04-21 DIAGNOSIS — Z510 Encounter for antineoplastic radiation therapy: Secondary | ICD-10-CM | POA: Diagnosis not present

## 2018-04-21 DIAGNOSIS — Z170 Estrogen receptor positive status [ER+]: Secondary | ICD-10-CM | POA: Diagnosis not present

## 2018-04-21 DIAGNOSIS — D0511 Intraductal carcinoma in situ of right breast: Secondary | ICD-10-CM | POA: Diagnosis not present

## 2018-04-22 ENCOUNTER — Ambulatory Visit
Admission: RE | Admit: 2018-04-22 | Discharge: 2018-04-22 | Disposition: A | Discharge status: Home / Self Care | Payer: Federal, State, Local not specified - PPO | Source: Ambulatory Visit | Attending: Radiation Oncology | Admitting: Radiation Oncology

## 2018-04-22 DIAGNOSIS — Z170 Estrogen receptor positive status [ER+]: Secondary | ICD-10-CM | POA: Diagnosis not present

## 2018-04-22 DIAGNOSIS — Z510 Encounter for antineoplastic radiation therapy: Secondary | ICD-10-CM | POA: Diagnosis not present

## 2018-04-22 DIAGNOSIS — D0511 Intraductal carcinoma in situ of right breast: Secondary | ICD-10-CM | POA: Diagnosis not present

## 2018-04-23 ENCOUNTER — Ambulatory Visit
Admission: RE | Admit: 2018-04-23 | Discharge: 2018-04-23 | Disposition: A | Discharge status: Home / Self Care | Payer: Federal, State, Local not specified - PPO | Source: Ambulatory Visit | Attending: Radiation Oncology | Admitting: Radiation Oncology

## 2018-04-23 DIAGNOSIS — Z170 Estrogen receptor positive status [ER+]: Secondary | ICD-10-CM | POA: Diagnosis not present

## 2018-04-23 DIAGNOSIS — D0511 Intraductal carcinoma in situ of right breast: Secondary | ICD-10-CM | POA: Diagnosis not present

## 2018-04-23 DIAGNOSIS — Z510 Encounter for antineoplastic radiation therapy: Secondary | ICD-10-CM | POA: Diagnosis not present

## 2018-04-27 ENCOUNTER — Other Ambulatory Visit: Payer: Self-pay | Admitting: *Deleted

## 2018-04-27 ENCOUNTER — Encounter: Payer: Self-pay | Admitting: Radiation Oncology

## 2018-04-27 ENCOUNTER — Ambulatory Visit
Admission: RE | Admit: 2018-04-27 | Discharge: 2018-04-27 | Disposition: A | Discharge status: Home / Self Care | Payer: Federal, State, Local not specified - PPO | Source: Ambulatory Visit | Attending: Radiation Oncology | Admitting: Radiation Oncology

## 2018-04-27 DIAGNOSIS — Z510 Encounter for antineoplastic radiation therapy: Secondary | ICD-10-CM | POA: Diagnosis not present

## 2018-04-27 DIAGNOSIS — D0511 Intraductal carcinoma in situ of right breast: Secondary | ICD-10-CM | POA: Diagnosis not present

## 2018-04-27 DIAGNOSIS — Z170 Estrogen receptor positive status [ER+]: Secondary | ICD-10-CM | POA: Diagnosis not present

## 2018-04-27 MED ORDER — GABAPENTIN 300 MG PO CAPS: 300.0000 mg | ORAL_CAPSULE | Freq: Every day | ORAL | 0 refills | Status: DC

## 2018-04-28 NOTE — Progress Notes (Signed)
  Radiation Oncology         [phone]) [phone] ________________________________  Name: Crystal Trevino MRN: [id number]  Date: 04/27/2018  DOB: 02-Feb-1941  End of Treatment Note  Diagnosis:   Ductal carcinoma in situ, high-grade, estrogen and progesterone receptor positivepresenting in the lower outer quadrant of the right breast.     Indication for treatment:  Curative       Radiation treatment dates:   03/30/2018 to 04/27/2018  Site/dose:   The patient received 40.05 Gy in 15 fractions to the right breast with a 10 Gy boost in 5 fractions.  Beams/energy:    1. 3D // 6X 2. Electron boost to lumpectomy cavity // 15E  Narrative: The patient tolerated radiation treatment relatively well.  The right breast developed hyperpigmentation changes and erythema but no skin breakdown. She denies any pain. Reports moderate fatigue. States she is using radiaplex as directed. She does report some itching within the treatment field.   Plan: The patient has completed radiation treatment. The patient will return to radiation oncology clinic for routine followup in one month. I advised them to call or return sooner if they have any questions or concerns related to their recovery or treatment.  -----------------------------------  Blair Promise, PhD, MD  This document serves as a record of services personally performed by Gery Pray, MD. It was created on his behalf by Arlyce Harman, a trained medical scribe. The creation of this record is based on the scribe's personal observations and the provider's statements to them. This document has been checked and approved by the attending provider.

## 2018-04-30 DIAGNOSIS — N210 Calculus in bladder: Secondary | ICD-10-CM | POA: Diagnosis not present

## 2018-04-30 DIAGNOSIS — N952 Postmenopausal atrophic vaginitis: Secondary | ICD-10-CM | POA: Diagnosis not present

## 2018-04-30 DIAGNOSIS — N993 Prolapse of vaginal vault after hysterectomy: Secondary | ICD-10-CM | POA: Diagnosis not present

## 2018-04-30 DIAGNOSIS — K469 Unspecified abdominal hernia without obstruction or gangrene: Secondary | ICD-10-CM | POA: Diagnosis not present

## 2018-05-20 DIAGNOSIS — N210 Calculus in bladder: Secondary | ICD-10-CM | POA: Diagnosis not present

## 2018-05-20 DIAGNOSIS — N993 Prolapse of vaginal vault after hysterectomy: Secondary | ICD-10-CM | POA: Diagnosis not present

## 2018-05-20 DIAGNOSIS — K469 Unspecified abdominal hernia without obstruction or gangrene: Secondary | ICD-10-CM | POA: Diagnosis not present

## 2018-05-20 DIAGNOSIS — N813 Complete uterovaginal prolapse: Secondary | ICD-10-CM | POA: Diagnosis not present

## 2018-05-20 DIAGNOSIS — N952 Postmenopausal atrophic vaginitis: Secondary | ICD-10-CM | POA: Diagnosis not present

## 2018-05-20 HISTORY — PX: VAGINAL PROLAPSE REPAIR: SHX830

## 2018-05-21 DIAGNOSIS — N993 Prolapse of vaginal vault after hysterectomy: Secondary | ICD-10-CM | POA: Diagnosis not present

## 2018-05-21 DIAGNOSIS — K469 Unspecified abdominal hernia without obstruction or gangrene: Secondary | ICD-10-CM | POA: Diagnosis not present

## 2018-05-21 DIAGNOSIS — N210 Calculus in bladder: Secondary | ICD-10-CM | POA: Diagnosis not present

## 2018-05-21 DIAGNOSIS — N952 Postmenopausal atrophic vaginitis: Secondary | ICD-10-CM | POA: Diagnosis not present

## 2018-05-27 ENCOUNTER — Ambulatory Visit
Admission: RE | Admit: 2018-05-27 | Payer: Federal, State, Local not specified - PPO | Source: Ambulatory Visit | Admitting: Radiation Oncology

## 2018-05-28 NOTE — Progress Notes (Signed)
[place]  [phone] [phone]     ID: Crystal Trevino DOB: 1941/03/06  MR#: [id number]  AOZ#:[id number]  Patient Care Team: Cari Caraway, MD as PCP - General (Family Medicine) Charmagne Buhl, Virgie Dad, MD as Consulting Physician (Oncology) Vickey Huger, MD as Consulting Physician (Orthopedic Surgery) Marti Sleigh, MD as Consulting Physician (Gynecology) Gery Pray, MD as Consulting Physician (Radiation Oncology) Alphonsa Overall, MD as Consulting Physician (General Surgery) Marti Sleigh, MD as Consulting Physician (Gynecology) OTHER MD:  CHIEF COMPLAINT: Estrogen receptor positive DCIS  CURRENT TREATMENT: To start tamoxifen   HISTORY OF CURRENT ILLNESS: From the original intake note:  "Crystal Trevino" had routine screening mammography on 12/09/2017 showing a possible abnormality in the right breast. She underwent bilateral diagnostic mammography with tomography and right breast ultrasonography at [facility] on 12/18/2017 the breast density to be category C.  There was an area of grouped heterogeneous calcifications in the lower outer quadrant but no significant masses ultrasonography found no abnormalities in the right axilla.    Accordingly on 12/21/2017 she proceeded to biopsy of the right breast area in question. 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  The patient's subsequent history is as detailed below.  INTERVAL HISTORY: Crystal Trevino returns today for follow up and treatment of her estrogen receptor positive breast cancer accompanied by her friend, Izora Gala. Since Crystal Trevino's last visit, she completed radiation treatments on 04/27/2018. She felt very fatigued during radiation, and still doesn't have a lot of energy. The breast area was tender and she had skin darkening. The  skin pealed, but it is starting to improve. She is using a Vitamin D lotion. She is also taking gabapentin at night. She is sleeping about 5-6 hours per night.   She also had  Vaginal prolapse surgery on 05/20/2018 under Dr. Mikle Bosworth. She notes that her bottom feels sore. She is taking 1 Advil and tylenol every 3 hours. For constipation, she tried taking stool softeners, but Milk of Magnesia helps her the most.    REVIEW OF SYSTEMS: Crystal Trevino reports that she is still recovering from her vaginal prolapse surgery. She plans on taking it easy for the 4th of July holiday. She denies unusual headaches, visual changes, nausea, vomiting, or dizziness. There has been no unusual cough, phlegm production, or pleurisy. This been no change in bowel or bladder habits. She denies unexplained fatigue or unexplained weight loss, bleeding, rash, or fever. A detailed review of systems was otherwise stable.    PAST MEDICAL HISTORY: Past Medical History:  Diagnosis Date  . Arthritis   . Breast CA ([place])    right breast cancer  . Cancer ([place])    HAD RIGHT BREAST BX --GOING TO CANCER CENTER ON WED 12/30/2017  . Constipation 08/13/2011  . Dizziness 08/13/2011  . Family history of adverse reaction to anesthesia    pt mother had PONV  . Family history of breast cancer   . Genetic testing 01/28/2018   Multi-Cancer panel (83 genes) @ [facility] - No pathogenic mutations detected  . GERD (gastroesophageal reflux disease)   . Heart murmur    "SLIGHT HEART MURMUR"  WAS PICKED UP AS AN ADULT  . Hyperlipidemia   . Hypertension   . Hypothyroidism   . Osteoarthritis of knee    Right  . Peripheral vascular disease ([place])   . Reflux   . Splenic artery aneurysm (  [place])    stable 1.5cm by 08/2013 CT; no further surveillance recommended  . Weight loss     PAST SURGICAL HISTORY: Past Surgical History:  Procedure Laterality Date  . ABDOMINAL HYSTERECTOMY    . BACK SURGERY    . BREAST LUMPECTOMY WITH RADIOACTIVE SEED  LOCALIZATION Right 02/19/2018   Procedure: BREAST LUMPECTOMY WITH RADIOACTIVE SEED LOCALIZATION. RIGHT;  Surgeon: Alphonsa Overall, MD;  Location: [place];  Service: General;  Laterality: Right;  . BREAST SURGERY    . CHOLECYSTECTOMY    . KNEE SURGERY    . LUMBAR LAMINECTOMY    . TOTAL KNEE ARTHROPLASTY Right 01/04/2018  . TOTAL KNEE ARTHROPLASTY Right 01/04/2018   Procedure: TOTAL KNEE ARTHROPLASTY;  Surgeon: Vickey Huger, MD;  Location: [place];  Service: Orthopedics;  Laterality: Right;  Marland Kitchen VAGINAL PROLAPSE REPAIR     2015   DONE IN [place]  Bilateral cataract extraction, Gall bladder removal. Hysterectomy with USO and remaining ovary was later removed.  FAMILY HISTORY Family History  Problem Relation Age of Onset  . Heart disease Mother   . Breast cancer Mother 76       deceased 57; TAH/BSO (age?)  . Heart disease Father   . Breast cancer Daughter        dx 23s; currenlty 46; Neg BRCA1/BRCA2  . Breast cancer Maternal Aunt 72       deceased 40  Her father passed from heart complications at age 72. Her mother also passed away from heart complications at age 4. She notes that her mother had breast cancer and was diagnosed in her 71's. Her daughter, [name] was diagnosed with breast cancer in her 80's. Her daughter was genetically testing in 2011. She denies a family history of ovarian cancer.  GYNECOLOGIC HISTORY:  No LMP recorded. Patient has had a hysterectomy. Menarche: [age] years old Age at first live birth: 77 years old GXP4 LMP: 1987 Contraceptive: oral pills with no complications HRT: Premarin since 1987 until January 2019 Hysterectomy with USO and the remaining ovary was later removed.    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 She is divorced, she shares her home and expenses with a friend, named [name].  The patient's son, [name], works in Insurance underwriter in [place], [place]. Her daughter, [name], is a Nutritional therapist at [employer].  The  patient's daughter, [name], works in an office in [place].  Patient's son, [name], is a Customer service manager in [place], [place]. She has 4 grandchildren and 1 step grandchild.   ADVANCED DIRECTIVES: [name] [phone]   HEALTH MAINTENANCE: Social History   Tobacco Use  . Smoking status: Never Smoker  . Smokeless tobacco: Never Used  Substance Use Topics  . Alcohol use: No  . Drug use: No     Colonoscopy: [facility] due this year  PAP:   Bone density: March 2013 at [facility] T score -0.6 normal   Allergies  Allergen Reactions  . Penicillins Nausea And Vomiting, Rash and Other (See Comments)    PATIENT HAS HAD A PCN REACTION WITH IMMEDIATE RASH, FACIAL/TONGUE/THROAT SWELLING, SOB, OR LIGHTHEADEDNESS WITH HYPOTENSION:  #  #  #  YES  #  #  #   Has patient had a PCN reaction causing severe rash involving mucus membranes or skin necrosis: No Has patient had a PCN reaction that required hospitalization: No Has patient had a PCN reaction occurring within the last 10 years: No     Current Outpatient Medications  Medication Sig  Dispense Refill  . aspirin EC 81 MG tablet Take 81 mg by mouth daily.    . carvedilol (COREG) 12.5 MG tablet Take 12.5 mg by mouth 2 (two) times daily with a meal.      . cholecalciferol (VITAMIN D) 1000 UNITS tablet Take 1,000 Units by mouth daily.    . fluticasone (FLONASE) 50 MCG/ACT nasal spray Place 1 spray into both nostrils daily as needed for allergies or rhinitis.    Marland Kitchen gabapentin (NEURONTIN) 300 MG capsule Take 1 capsule (300 mg total) by mouth at bedtime. 90 capsule 0  . levothyroxine (SYNTHROID, LEVOTHROID) 75 MCG tablet Take 75 mcg by mouth daily before breakfast.     . Multiple Vitamin (MULTIVITAMIN PO) Take 1 tablet by mouth daily.      . simvastatin (ZOCOR) 20 MG tablet Take 20 mg by mouth at bedtime.      Marland Kitchen telmisartan (MICARDIS) 20 MG tablet Take 20 mg by mouth daily.     No current facility-administered medications for this visit.     OBJECTIVE: Older  white woman in no acute distress  Vitals:   05/31/18 0921  BP: (!) 188/87  Pulse: 78  Resp: 18  Temp: 97.8 F (36.6 C)  SpO2: 100%     Body mass index is 27.2 kg/m.   Wt Readings from Last 3 Encounters:  05/31/18 168 lb 8 oz (76.4 kg)  03/29/18 166 lb 3.2 oz (75.4 kg)  03/08/18 164 lb 12.8 oz (74.8 kg)      ECOG FS:2 - Symptomatic, <50% confined to bed  Sclerae unicteric, EOMs intact Oropharynx clear and moist No cervical or supraclavicular adenopathy Lungs no rales or rhonchi Heart regular rate and rhythm Abd soft, nontender, positive bowel sounds MSK no focal spinal tenderness, no upper extremity lymphedema Neuro: nonfocal, well oriented, appropriate affect Breasts: The right breast is status post lumpectomy and radiation.  The cosmetic result is good.  There is no evidence of residual or recurrent disease.  The left breast is benign.  Both axillae are benign.    LAB RESULTS:  CMP     Component Value Date/Time   NA 137 02/19/2018 0924   K 4.1 02/19/2018 0924   CL 104 02/19/2018 0924   CO2 24 02/19/2018 0924   GLUCOSE 92 02/19/2018 0924   BUN 15 02/19/2018 0924   CREATININE 0.77 02/19/2018 0924   CREATININE 0.94 08/26/2013 1710   CALCIUM 9.2 02/19/2018 0924   PROT 6.6 12/28/2017 1004   ALBUMIN 4.0 12/28/2017 1004   AST 23 12/28/2017 1004   ALT 18 12/28/2017 1004   ALKPHOS 59 12/28/2017 1004   BILITOT 1.3 (H) 12/28/2017 1004   GFRNONAA >60 02/19/2018 0924   GFRAA >60 02/19/2018 0924    No results found for: TOTALPROTELP, ALBUMINELP, A1GS, A2GS, BETS, BETA2SER, GAMS, MSPIKE, SPEI  No results found for: KPAFRELGTCHN, LAMBDASER, KAPLAMBRATIO  Lab Results  Component Value Date   WBC 6.4 02/19/2018   NEUTROABS 4.4 12/28/2017   HGB 11.0 (L) 02/19/2018   HCT 34.8 (L) 02/19/2018   MCV 94.1 02/19/2018   PLT 235 02/19/2018    '@LASTCHEMISTRY'$ @  No results found for: LABCA2  No components found for: [id number]  No results for input(s): INR in the last 168  hours.  No results found for: LABCA2  No results found for: OJJ009  No results found for: FGH829  No results found for: HBZ169  No results found for: CA2729  No components found for: HGQUANT  No results found for:  CEA1 / No results found for: CEA1   No results found for: AFPTUMOR  No results found for: CHROMOGRNA  No results found for: PSA1  No visits with results within 3 Day(s) from this visit.  Latest known visit with results is:  Admission on 02/19/2018, Discharged on 02/19/2018  Component Date Value Ref Range Status  . Sodium 02/19/2018 137  135 - 145 mmol/L Final  . Potassium 02/19/2018 4.1  3.5 - 5.1 mmol/L Final  . Chloride 02/19/2018 104  101 - 111 mmol/L Final  . CO2 02/19/2018 24  22 - 32 mmol/L Final  . Glucose, Bld 02/19/2018 92  65 - 99 mg/dL Final  . BUN 02/19/2018 15  6 - 20 mg/dL Final  . Creatinine, Ser 02/19/2018 0.77  0.44 - 1.00 mg/dL Final  . Calcium 02/19/2018 9.2  8.9 - 10.3 mg/dL Final  . GFR calc non Af Amer 02/19/2018 >60  >60 mL/min Final  . GFR calc Af Amer 02/19/2018 >60  >60 mL/min Final   Comment: (NOTE) The eGFR has been calculated using the CKD EPI equation. This calculation has not been validated in all clinical situations. eGFR's persistently <60 mL/min signify possible Chronic Kidney Disease.   Georgiann Hahn gap 02/19/2018 9  5 - 15 Final   Performed at [facility], [place] [address] [place], [place] [address]  . WBC 02/19/2018 6.4  4.0 - 10.5 K/uL Final  . RBC 02/19/2018 3.70* 3.87 - 5.11 MIL/uL Final  . Hemoglobin 02/19/2018 11.0* 12.0 - 15.0 g/dL Final  . HCT 02/19/2018 34.8* 36.0 - 46.0 % Final  . MCV 02/19/2018 94.1  78.0 - 100.0 fL Final  . MCH 02/19/2018 29.7  26.0 - 34.0 pg Final  . MCHC 02/19/2018 31.6  30.0 - 36.0 g/dL Final  . RDW 02/19/2018 14.3  11.5 - 15.5 % Final  . Platelets 02/19/2018 235  150 - 400 K/uL Final   Performed at [facility], [place] [address], [place], [place] [address]    (this displays the  last labs from the last 3 days)  No results found for: TOTALPROTELP, ALBUMINELP, A1GS, A2GS, BETS, BETA2SER, GAMS, MSPIKE, SPEI (this displays SPEP labs)  No results found for: KPAFRELGTCHN, LAMBDASER, KAPLAMBRATIO (kappa/lambda light chains)  No results found for: HGBA, HGBA2QUANT, HGBFQUANT, HGBSQUAN (Hemoglobinopathy evaluation)   No results found for: LDH  No results found for: IRON, TIBC, IRONPCTSAT (Iron and TIBC)  No results found for: FERRITIN  Urinalysis No results found for: COLORURINE, APPEARANCEUR, LABSPEC, PHURINE, GLUCOSEU, HGBUR, BILIRUBINUR, KETONESUR, PROTEINUR, UROBILINOGEN, NITRITE, LEUKOCYTESUR   STUDIES: No results found.   ELIGIBLE FOR AVAILABLE RESEARCH PROTOCOL: no  ASSESSMENT: 77 y.o. [place], [place] Woman status post right breast lower outer quadrant biopsy on 12/21/2017 for a ductal carcinoma in situ, high-grade, estrogen and progesterone receptor positive  (1) genetics testing 01/28/2018 through  [facility]'s Multi-Cancer panel found no deleterious mutations in (ALK, APC, ATM, AXIN2, BAP1, BARD1, BLM, BMPR1A, BRCA1, BRCA2, BRIP1, CASR, CDC73, CDH1, CDK4, CDKN1B, CDKN1C, CDKN2A, CEBPA, CHEK2, CTNNA1, DICER1, DIS3L2, EGFR, EPCAM, FH, FLCN, GATA2, GPC3, GREM1, HOXB13, HRAS, KIT, MAX, MEN1, MET, MITF, MLH1, MSH2, MSH3, MSH6, MUTYH, NBN, NF1, NF2, NTHL1, PALB2, PDGFRA, PHOX2B, PMS2, POLD1, POLE, POT1, PRKAR1A, PTCH1, PTEN, RAD50, RAD51C, RAD51D, RB1, RECQL4, RET, RUNX1, SDHA, SDHAF2, SDHB, SDHC, SDHD, SMAD4, SMARCA4, SMARCB1, SMARCE1, STK11, SUFU, TERC, TERT, TMEM127, TP53, TSC1, TSC2, VHL, WRN, WT1).  (2) status post right lumpectomy 02/19/2018 showing ductal carcinoma in situ, high-grade, measuring 3.5 cm, with negative margins.  (  3) adjuvant radiation 03/30/2018 to 04/27/2018 Site/dose:   The patient received 40.05Gy in 43factions to the right breast with a 10 Gy boost in 5 fractions  (4) to start tamoxifen 08/31/2018  PLAN: Crystal Trevino has had multiple  surgeries over the last year and she is still recovering from the most recent one.  Today we reviewed the fact that her cancer was noninvasive, that her risk of local recurrence is very low, and that as far as that goes she is pretty much done with that cancer  Her risk of developing another breast cancer is approximately 1 %/year.  She can cut that in half by taking antiestrogens.   We discussed the difference between tamoxifen and anastrozole in detail. She understands that anastrozole and the aromatase inhibitors in general work by blocking estrogen production. Accordingly vaginal dryness, decrease in bone density, and of course hot flashes can result. The aromatase inhibitors can also negatively affect the cholesterol profile, although that is a minor effect. One out of 5 women on aromatase inhibitors we will feel "old and achy". This arthralgia/myalgia syndrome, which resembles fibromyalgia clinically, does resolve with stopping the medications. Accordingly this is not a reason to not try an aromatase inhibitor but it is a frequent reason to stop it (in other words 20% of women will not be able to tolerate these medications).  Tamoxifen on the other hand does not block estrogen production. It does not "take away a woman's estrogen". It blocks the estrogen receptor in breast cells. Like anastrozole, it can also cause hot flashes. As opposed to anastrozole, tamoxifen has many estrogen-like effects. It is technically an estrogen receptor modulator. This means that in some tissues tamoxifen works like estrogen-- for example it helps strengthen the bones. It tends to improve the cholesterol profile. It can cause thickening of the endometrial lining, and even endometrial polyps or rarely cancer of the uterus.(The risk of uterine cancer due to tamoxifen is one additional cancer per thousand women year). It can cause vaginal wetness or stickiness. It can cause blood clots through this estrogen-like effect--the  risk of blood clots with tamoxifen is exactly the same as with birth control pills or hormone replacement.  Neither of these agents causes mood changes or weight gain, despite the popular belief that they can have these side effects. We have data from studies comparing either of these drugs with placebo, and in those cases the control group had the same amount of weight gain and depression as the group that took the drug.  Since she does not have a uterus and already has had cataract surgery she is a particularly good candidate for tamoxifen.  This will also help her bone density.  She is still in the recovery phase so we are not going to start tamoxifen until August 31, 2018.  She will see me again mid January.  Whether or not she decides to take tamoxifen for 5 years, I will start seeing her on a once a year basis at that time  She knows to call for any issues that may develop before the next visit.  Rainey Rodger, GVirgie Dad MD  05/31/18 9:31 AM Medical Oncology and Hematology [facility] [address]Tel. [phone]   Fax. [phone] IAlice Rieger am acting as scribe for GChauncey CruelMD.  I, GLurline DelMD, have reviewed the above documentation for accuracy and completeness, and I agree with the above.

## 2018-05-31 ENCOUNTER — Telehealth: Payer: Self-pay | Admitting: Oncology

## 2018-05-31 ENCOUNTER — Inpatient Hospital Stay: Payer: Federal, State, Local not specified - PPO | Attending: Oncology | Admitting: Oncology

## 2018-05-31 VITALS — BP 188/87 | HR 78 | Temp 97.8°F | Resp 18 | Ht 66.0 in | Wt 168.5 lb

## 2018-05-31 DIAGNOSIS — D0511 Intraductal carcinoma in situ of right breast: Secondary | ICD-10-CM | POA: Diagnosis not present

## 2018-05-31 DIAGNOSIS — E039 Hypothyroidism, unspecified: Secondary | ICD-10-CM | POA: Diagnosis not present

## 2018-05-31 DIAGNOSIS — Z170 Estrogen receptor positive status [ER+]: Secondary | ICD-10-CM

## 2018-05-31 DIAGNOSIS — Z923 Personal history of irradiation: Secondary | ICD-10-CM | POA: Diagnosis not present

## 2018-05-31 NOTE — Telephone Encounter (Signed)
Gave avs and calendar ° °

## 2018-06-02 DIAGNOSIS — L089 Local infection of the skin and subcutaneous tissue, unspecified: Secondary | ICD-10-CM | POA: Diagnosis not present

## 2018-06-07 ENCOUNTER — Other Ambulatory Visit: Payer: Self-pay

## 2018-06-07 ENCOUNTER — Encounter: Payer: Self-pay | Admitting: *Deleted

## 2018-06-07 ENCOUNTER — Encounter: Payer: Self-pay | Admitting: Radiation Oncology

## 2018-06-07 ENCOUNTER — Ambulatory Visit
Admission: RE | Admit: 2018-06-07 | Discharge: 2018-06-07 | Disposition: A | Discharge status: Home / Self Care | Payer: Federal, State, Local not specified - PPO | Source: Ambulatory Visit | Attending: Radiation Oncology | Admitting: Radiation Oncology

## 2018-06-07 VITALS — BP 167/77 | HR 72 | Temp 98.5°F | Resp 18 | Wt 167.6 lb

## 2018-06-07 DIAGNOSIS — Y842 Radiological procedure and radiotherapy as the cause of abnormal reaction of the patient, or of later complication, without mention of misadventure at the time of the procedure: Secondary | ICD-10-CM | POA: Diagnosis not present

## 2018-06-07 DIAGNOSIS — Z923 Personal history of irradiation: Secondary | ICD-10-CM | POA: Diagnosis not present

## 2018-06-07 DIAGNOSIS — D0511 Intraductal carcinoma in situ of right breast: Secondary | ICD-10-CM | POA: Insufficient documentation

## 2018-06-07 DIAGNOSIS — Z79899 Other long term (current) drug therapy: Secondary | ICD-10-CM | POA: Insufficient documentation

## 2018-06-07 DIAGNOSIS — Z880 Allergy status to penicillin: Secondary | ICD-10-CM | POA: Diagnosis not present

## 2018-06-07 DIAGNOSIS — Z7982 Long term (current) use of aspirin: Secondary | ICD-10-CM | POA: Insufficient documentation

## 2018-06-07 DIAGNOSIS — Z171 Estrogen receptor negative status [ER-]: Secondary | ICD-10-CM | POA: Insufficient documentation

## 2018-06-07 NOTE — Progress Notes (Signed)
Pt presents for follow up today. Skin is WNL per pt. Pt is using cream with Vitamin E daily. Pt denies pain. Pt reports mild fatigue, which is most likely related to UTI for which pt is currently receiving treatment.   BP (!) 167/77   Pulse 72   Temp 98.5 F (36.9 C)   Resp 18   Wt 167 lb 9.6 oz (76 kg)   SpO2 100%   BMI 27.05 kg/m   Wt Readings from Last 3 Encounters:  06/07/18 167 lb 9.6 oz (76 kg)  05/31/18 168 lb 8 oz (76.4 kg)  03/29/18 166 lb 3.2 oz (75.4 kg)    Loma Sousa, RN BSN

## 2018-06-07 NOTE — Progress Notes (Signed)
Radiation Oncology         [phone] ________________________________  Name: Crystal Trevino MRN: [id number]  Date: 06/07/2018  DOB: 1941/06/14  Follow-Up Visit Note  CC: Cari Caraway, MD  Magrinat, Virgie Dad, MD    ICD-10-CM   1. Ductal carcinoma in situ (DCIS) of right breast D05.11     Diagnosis:   77 y.o. female with Ductal carcinoma in situ, high-grade, estrogen and progesterone receptor positivepresenting in the lower outer quadrant of the right breast  Interval Since Last Radiation:  6 weeks   Radiation treatment dates:   03/30/2018-04/27/2018 Site/dose:   The patient received 40.05Gy in 52fractions to the right breast with a 10 Gy boost in 5 fractions.  Narrative:  The patient returns today for routine follow-up of radiation completed 6 weeks ago to her right breast. She denies any skin issues. She is using cream with Vitamin E daily. She denies any nipple discharge or bleeding. She denies any pain or swelling. She reports mild fatigue related to vaginal relapse surgery 2 weeks ago. She states that she will start anti-estrogen therapy in October.               ALLERGIES:  is allergic to penicillins.  Meds: Current Outpatient Medications  Medication Sig Dispense Refill  . aspirin EC 81 MG tablet Take 81 mg by mouth daily.    . carvedilol (COREG) 12.5 MG tablet Take 12.5 mg by mouth 2 (two) times daily with a meal.      . cholecalciferol (VITAMIN D) 1000 UNITS tablet Take 1,000 Units by mouth daily.    . ciprofloxacin (CIPRO) 250 MG tablet Take by mouth.    . conjugated estrogens (PREMARIN) vaginal cream Place vaginally.    . fluticasone (FLONASE) 50 MCG/ACT nasal spray Place 1 spray into both nostrils daily as needed for allergies or rhinitis.    Marland Kitchen gabapentin (NEURONTIN) 300 MG capsule Take 1 capsule (300 mg total) by mouth at bedtime. 90 capsule 0  . levothyroxine (SYNTHROID, LEVOTHROID) 75 MCG tablet Take 75 mcg by mouth daily before breakfast.     . metroNIDAZOLE  (FLAGYL) 500 MG tablet Take by mouth.    . Multiple Vitamin (MULTIVITAMIN PO) Take 1 tablet by mouth daily.      . simvastatin (ZOCOR) 20 MG tablet Take 20 mg by mouth at bedtime.      Marland Kitchen telmisartan (MICARDIS) 20 MG tablet Take 20 mg by mouth daily.     No current facility-administered medications for this encounter.     Physical Findings: The patient is in no acute distress. Patient is alert and oriented.  weight is 167 lb 9.6 oz (76 kg). Her temperature is 98.5 F (36.9 C). Her blood pressure is 167/77 (abnormal) and her pulse is 72. Her respiration is 18 and oxygen saturation is 100%.   Lungs are clear to auscultation bilaterally. Heart has regular rate and rhythm. No palpable cervical, supraclavicular, or axillary adenopathy. Abdomen soft, non-tender, normal bowel sounds.  Right Breast: Healed well. Mild hyperpigmentation changes. No palpable mass, nipple discharge or bleeding.  Lab Findings: Lab Results  Component Value Date   WBC 6.4 02/19/2018   HGB 11.0 (L) 02/19/2018   HCT 34.8 (L) 02/19/2018   MCV 94.1 02/19/2018   PLT 235 02/19/2018    Radiographic Findings: No results found.  Impression:  The patient is recovering from the effects of radiation.  No evidence of recurrence on clinical exam.  Plan:  Patient will follow up in 3  months. She will start hormonal therapy about that time. She will follow up with Dr. Jana Hakim in January 2020.  ____________________________________  Blair Promise, PhD, MD  This document serves as a record of services personally performed by Gery Pray, MD. It was created on his behalf by Rae Lips, a trained medical scribe. The creation of this record is based on the scribe's personal observations and the provider's statements to them. This document has been checked and approved by the attending provider.

## 2018-07-01 DIAGNOSIS — E559 Vitamin D deficiency, unspecified: Secondary | ICD-10-CM | POA: Diagnosis not present

## 2018-07-01 DIAGNOSIS — I10 Essential (primary) hypertension: Secondary | ICD-10-CM | POA: Diagnosis not present

## 2018-07-01 DIAGNOSIS — R7301 Impaired fasting glucose: Secondary | ICD-10-CM | POA: Diagnosis not present

## 2018-07-01 DIAGNOSIS — E039 Hypothyroidism, unspecified: Secondary | ICD-10-CM | POA: Diagnosis not present

## 2018-07-01 DIAGNOSIS — Z0000 Encounter for general adult medical examination without abnormal findings: Secondary | ICD-10-CM | POA: Diagnosis not present

## 2018-07-01 DIAGNOSIS — E782 Mixed hyperlipidemia: Secondary | ICD-10-CM | POA: Diagnosis not present

## 2018-07-01 DIAGNOSIS — M8580 Other specified disorders of bone density and structure, unspecified site: Secondary | ICD-10-CM | POA: Diagnosis not present

## 2018-07-08 DIAGNOSIS — Z96651 Presence of right artificial knee joint: Secondary | ICD-10-CM | POA: Diagnosis not present

## 2018-07-08 DIAGNOSIS — Z471 Aftercare following joint replacement surgery: Secondary | ICD-10-CM | POA: Diagnosis not present

## 2018-07-21 DIAGNOSIS — Z780 Asymptomatic menopausal state: Secondary | ICD-10-CM | POA: Diagnosis not present

## 2018-07-21 DIAGNOSIS — M8580 Other specified disorders of bone density and structure, unspecified site: Secondary | ICD-10-CM | POA: Diagnosis not present

## 2018-07-24 ENCOUNTER — Other Ambulatory Visit: Payer: Self-pay | Admitting: Oncology

## 2018-07-26 ENCOUNTER — Other Ambulatory Visit: Payer: Self-pay | Admitting: *Deleted

## 2018-07-26 MED ORDER — GABAPENTIN 300 MG PO CAPS: ORAL_CAPSULE | ORAL | 0 refills | Status: DC

## 2018-08-28 DIAGNOSIS — Z23 Encounter for immunization: Secondary | ICD-10-CM | POA: Diagnosis not present

## 2018-09-22 ENCOUNTER — Other Ambulatory Visit: Payer: Self-pay | Admitting: *Deleted

## 2018-09-22 MED ORDER — GABAPENTIN 300 MG PO CAPS: ORAL_CAPSULE | ORAL | 0 refills | Status: DC

## 2018-09-22 MED ORDER — TAMOXIFEN CITRATE 20 MG PO TABS: 20.0000 mg | ORAL_TABLET | Freq: Every day | ORAL | 3 refills | Status: DC

## 2018-10-19 DIAGNOSIS — J069 Acute upper respiratory infection, unspecified: Secondary | ICD-10-CM | POA: Diagnosis not present

## 2018-12-12 NOTE — Progress Notes (Signed)
[place]  [phone] [phone]     ID: Crystal Trevino DOB: 03/24/1941  MR#: [id number]  XNT#:[id number]  Patient Care Team: Cari Caraway, MD as PCP - General (Family Medicine) Christl Fessenden, Virgie Dad, MD as Consulting Physician (Oncology) Vickey Huger, MD as Consulting Physician (Orthopedic Surgery) Marti Sleigh, MD as Consulting Physician (Gynecology) Gery Pray, MD as Consulting Physician (Radiation Oncology) Alphonsa Overall, MD as Consulting Physician (General Surgery) Marti Sleigh, MD as Consulting Physician (Gynecology) OTHER MD:  CHIEF COMPLAINT: Estrogen receptor positive DCIS  CURRENT TREATMENT:  tamoxifen   HISTORY OF CURRENT ILLNESS: From the original intake note:  "Crystal Trevino" had routine screening mammography on 12/09/2017 showing a possible abnormality in the right breast. She underwent bilateral diagnostic mammography with tomography and right breast ultrasonography at [facility] on 12/18/2017 the breast density to be category C.  There was an area of grouped heterogeneous calcifications in the lower outer quadrant but no significant masses ultrasonography found no abnormalities in the right axilla.    Accordingly on 12/21/2017 she proceeded to biopsy of the right breast area in question. 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  The patient's subsequent history is as detailed below.  INTERVAL HISTORY: Crystal Trevino returns today for follow-up and treatment of her estrogen receptor positive breast cancer.   The patient continues on tamoxifen. She was nauseous the first week, but that has resolved. She has leg cramps, but she has experienced those her whole life; they are unchanged from her normal cramping. She has hot flashes, that she has she has 24 hrs  per day. She is taking gabapentin to help her sleep at night, and can usually sleep about three hours before being woken up.   Since her last visit here, she has not undergone any additional studies.    REVIEW OF SYSTEMS: Crystal Trevino  had all of her kids and grandchildren over at her house for the holidays; they catered food rather than cooking. The patient denies unusual headaches, visual changes, vomiting, or dizziness. There has been no unusual cough, phlegm production, or pleurisy. This been no change in bowel or bladder habits. The patient denies unexplained fatigue or unexplained weight loss, bleeding, rash, or fever. A detailed review of systems was otherwise noncontributory.    PAST MEDICAL HISTORY: Past Medical History:  Diagnosis Date  . Arthritis   . Breast CA ([place])    right breast cancer  . Cancer ([place])    HAD RIGHT BREAST BX --GOING TO CANCER CENTER ON WED 12/30/2017  . Constipation 08/13/2011  . Dizziness 08/13/2011  . Family history of adverse reaction to anesthesia    pt mother had PONV  . Family history of breast cancer   . Genetic testing 01/28/2018   Multi-Cancer panel (83 genes) @ [facility] - No pathogenic mutations detected  . GERD (gastroesophageal reflux disease)   . Heart murmur    "SLIGHT HEART MURMUR"  WAS PICKED UP AS AN ADULT  . Hyperlipidemia   . Hypertension   . Hypothyroidism   . Osteoarthritis of knee    Right  . Peripheral vascular disease ([place])   . Reflux   . Splenic artery aneurysm ([facility])    stable 1.5cm by 08/2013 CT; no further surveillance recommended  . Weight loss     PAST SURGICAL HISTORY: Past Surgical History:  Procedure Laterality Date  . ABDOMINAL HYSTERECTOMY    .  BACK SURGERY    . BREAST LUMPECTOMY WITH RADIOACTIVE SEED LOCALIZATION Right 02/19/2018   Procedure: BREAST LUMPECTOMY WITH RADIOACTIVE SEED LOCALIZATION. RIGHT;  Surgeon: Alphonsa Overall, MD;  Location: [place];  Service: General;  Laterality: Right;  . BREAST SURGERY    .  CHOLECYSTECTOMY    . KNEE SURGERY    . LUMBAR LAMINECTOMY    . TOTAL KNEE ARTHROPLASTY Right 01/04/2018  . TOTAL KNEE ARTHROPLASTY Right 01/04/2018   Procedure: TOTAL KNEE ARTHROPLASTY;  Surgeon: Vickey Huger, MD;  Location: [place];  Service: Orthopedics;  Laterality: Right;  Marland Kitchen VAGINAL PROLAPSE REPAIR     2015   DONE IN [place]  . VAGINAL PROLAPSE REPAIR  05/20/2018  Bilateral cataract extraction, Gall bladder removal. Hysterectomy with USO and remaining ovary was later removed.  FAMILY HISTORY Family History  Problem Relation Age of Onset  . Heart disease Mother   . Breast cancer Mother 53       deceased 83; TAH/BSO (age?)  . Heart disease Father   . Breast cancer Daughter        dx 62s; currenlty 41; Neg BRCA1/BRCA2  . Breast cancer Maternal Aunt 81       deceased 85  Her father passed from heart complications at age 26. Her mother also passed away from heart complications at age 60. She notes that her mother had breast cancer and was diagnosed in her 19's. Her daughter, Crystal Trevino was diagnosed with breast cancer in her 70's. Her daughter was genetically testing in 2011. She denies a family history of ovarian cancer.  GYNECOLOGIC HISTORY:  No LMP recorded. Patient has had a hysterectomy. Menarche: 23/78 years old Age at first live birth: 78 years old GXP4 LMP: 1987 Contraceptive: oral pills with no complications HRT: Premarin since 1987 until January 2019 Hysterectomy with USO and the remaining ovary was later removed.    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 She is divorced, she shares her home and expenses with a friend, named Crystal Trevino.  The patient's son, Crystal Trevino, works in Insurance underwriter in [place], [place]. Her daughter, Crystal Trevino, is a Nutritional therapist at [employer].  The patient's daughter, Crystal Trevino, works in an office in [place].  Patient's son, Crystal Trevino, is a Customer service manager in [place], [place]. She has 4 grandchildren and 1 step grandchild.   ADVANCED  DIRECTIVES: Crystal Trevino [phone]   HEALTH MAINTENANCE: Social History   Tobacco Use  . Smoking status: Never Smoker  . Smokeless tobacco: Never Used  Substance Use Topics  . Alcohol use: No  . Drug use: No     Colonoscopy: [facility] due this year  PAP:   Bone density: March 2013 at [facility] T score -0.6 normal   Allergies  Allergen Reactions  . Penicillins Nausea And Vomiting, Rash and Other (See Comments)    PATIENT HAS HAD A PCN REACTION WITH IMMEDIATE RASH, FACIAL/TONGUE/THROAT SWELLING, SOB, OR LIGHTHEADEDNESS WITH HYPOTENSION:  #  #  #  YES  #  #  #   Has patient had a PCN reaction causing severe rash involving mucus membranes or skin necrosis: No Has patient had a PCN reaction that required hospitalization: No Has patient had a PCN reaction occurring within the last 10 years: No     Current Outpatient Medications  Medication Sig Dispense Refill  . aspirin EC 81 MG tablet Take 81 mg by mouth daily.    . carvedilol (COREG) 12.5 MG tablet Take 12.5 mg by mouth 2 (two) times  daily with a meal.      . cholecalciferol (VITAMIN D) 1000 UNITS tablet Take 1,000 Units by mouth daily.    Marland Kitchen conjugated estrogens (PREMARIN) vaginal cream Place vaginally.    . fluticasone (FLONASE) 50 MCG/ACT nasal spray Place 1 spray into both nostrils daily as needed for allergies or rhinitis.    Marland Kitchen gabapentin (NEURONTIN) 300 MG capsule TAKE 1 CAPSULE BY MOUTH EVERYDAY AT BEDTIME 90 capsule 0  . levothyroxine (SYNTHROID, LEVOTHROID) 75 MCG tablet Take 75 mcg by mouth daily before breakfast.     . Multiple Vitamin (MULTIVITAMIN PO) Take 1 tablet by mouth daily.      . simvastatin (ZOCOR) 20 MG tablet Take 20 mg by mouth at bedtime.      . tamoxifen (NOLVADEX) 20 MG tablet Take 1 tablet (20 mg total) by mouth daily. 30 tablet 3  . telmisartan (MICARDIS) 20 MG tablet Take 20 mg by mouth daily.     No current facility-administered medications for this visit.     OBJECTIVE: Older white woman who  appears well  Vitals:   12/13/18 0902  BP: (!) 150/63  Pulse: 72  Resp: 18  Temp: 98.6 F (37 C)  SpO2: 100%     Body mass index is 28.18 kg/m.   Wt Readings from Last 3 Encounters:  12/13/18 174 lb 9.6 oz (79.2 kg)  06/07/18 167 lb 9.6 oz (76 kg)  05/31/18 168 lb 8 oz (76.4 kg)      ECOG FS:1 - Symptomatic but completely ambulatory  Sclerae unicteric, EOMs intact No cervical or supraclavicular adenopathy Lungs no rales or rhonchi Heart regular rate and rhythm Abd soft, nontender, positive bowel sounds MSK no focal spinal tenderness, no upper extremity lymphedema Neuro: nonfocal, well oriented, appropriate affect Breasts: The right breast is status post lumpectomy and radiation.  There is mild coarsening of the skin, no significant hyperpigmentation.  The cosmetic result is good.  There is no evidence of disease recurrence.  The left breast is benign.  Both axillae are benign.     LAB RESULTS:  CMP     Component Value Date/Time   NA 137 02/19/2018 0924   K 4.1 02/19/2018 0924   CL 104 02/19/2018 0924   CO2 24 02/19/2018 0924   GLUCOSE 92 02/19/2018 0924   BUN 15 02/19/2018 0924   CREATININE 0.77 02/19/2018 0924   CREATININE 0.94 08/26/2013 1710   CALCIUM 9.2 02/19/2018 0924   PROT 6.6 12/28/2017 1004   ALBUMIN 4.0 12/28/2017 1004   AST 23 12/28/2017 1004   ALT 18 12/28/2017 1004   ALKPHOS 59 12/28/2017 1004   BILITOT 1.3 (H) 12/28/2017 1004   GFRNONAA >60 02/19/2018 0924   GFRAA >60 02/19/2018 0924    No results found for: TOTALPROTELP, ALBUMINELP, A1GS, A2GS, BETS, BETA2SER, GAMS, MSPIKE, SPEI  No results found for: KPAFRELGTCHN, LAMBDASER, KAPLAMBRATIO  Lab Results  Component Value Date   WBC 6.4 02/19/2018   NEUTROABS 4.4 12/28/2017   HGB 11.0 (L) 02/19/2018   HCT 34.8 (L) 02/19/2018   MCV 94.1 02/19/2018   PLT 235 02/19/2018    '@LASTCHEMISTRY'$ @  No results found for: LABCA2  No components found for: [id number]  No results for input(s): INR  in the last 168 hours.  No results found for: LABCA2  No results found for: ASN053  No results found for: ZJQ734  No results found for: LPF790  No results found for: CA2729  No components found for: HGQUANT  No results found for:  CEA1 / No results found for: CEA1   No results found for: AFPTUMOR  No results found for: CHROMOGRNA  No results found for: PSA1  No visits with results within 3 Day(s) from this visit.  Latest known visit with results is:  Admission on 02/19/2018, Discharged on 02/19/2018  Component Date Value Ref Range Status  . Sodium 02/19/2018 137  135 - 145 mmol/L Final  . Potassium 02/19/2018 4.1  3.5 - 5.1 mmol/L Final  . Chloride 02/19/2018 104  101 - 111 mmol/L Final  . CO2 02/19/2018 24  22 - 32 mmol/L Final  . Glucose, Bld 02/19/2018 92  65 - 99 mg/dL Final  . BUN 02/19/2018 15  6 - 20 mg/dL Final  . Creatinine, Ser 02/19/2018 0.77  0.44 - 1.00 mg/dL Final  . Calcium 02/19/2018 9.2  8.9 - 10.3 mg/dL Final  . GFR calc non Af Amer 02/19/2018 >60  >60 mL/min Final  . GFR calc Af Amer 02/19/2018 >60  >60 mL/min Final   Comment: (NOTE) The eGFR has been calculated using the CKD EPI equation. This calculation has not been validated in all clinical situations. eGFR's persistently <60 mL/min signify possible Chronic Kidney Disease.   Georgiann Hahn gap 02/19/2018 9  5 - 15 Final   Performed at [facility], [place] [address] [place], [place] [address]  . WBC 02/19/2018 6.4  4.0 - 10.5 K/uL Final  . RBC 02/19/2018 3.70* 3.87 - 5.11 MIL/uL Final  . Hemoglobin 02/19/2018 11.0* 12.0 - 15.0 g/dL Final  . HCT 02/19/2018 34.8* 36.0 - 46.0 % Final  . MCV 02/19/2018 94.1  78.0 - 100.0 fL Final  . MCH 02/19/2018 29.7  26.0 - 34.0 pg Final  . MCHC 02/19/2018 31.6  30.0 - 36.0 g/dL Final  . RDW 02/19/2018 14.3  11.5 - 15.5 % Final  . Platelets 02/19/2018 235  150 - 400 K/uL Final   Performed at [facility], [place] [address], [place], [place] [address]      (this displays the last labs from the last 3 days)  No results found for: TOTALPROTELP, ALBUMINELP, A1GS, A2GS, BETS, BETA2SER, GAMS, MSPIKE, SPEI (this displays SPEP labs)  No results found for: KPAFRELGTCHN, LAMBDASER, KAPLAMBRATIO (kappa/lambda light chains)  No results found for: HGBA, HGBA2QUANT, HGBFQUANT, HGBSQUAN (Hemoglobinopathy evaluation)   No results found for: LDH  No results found for: IRON, TIBC, IRONPCTSAT (Iron and TIBC)  No results found for: FERRITIN  Urinalysis No results found for: COLORURINE, APPEARANCEUR, LABSPEC, PHURINE, GLUCOSEU, HGBUR, BILIRUBINUR, KETONESUR, PROTEINUR, UROBILINOGEN, NITRITE, LEUKOCYTESUR   STUDIES: No results found.   ELIGIBLE FOR AVAILABLE RESEARCH PROTOCOL: no  ASSESSMENT: 78 y.o. [place], [place] Woman status post right breast lower outer quadrant biopsy on 12/21/2017 for a ductal carcinoma in situ, high-grade, estrogen and progesterone receptor positive  (1) genetics testing 01/28/2018 through  [facility]'s Multi-Cancer panel found no deleterious mutations in (ALK, APC, ATM, AXIN2, BAP1, BARD1, BLM, BMPR1A, BRCA1, BRCA2, BRIP1, CASR, CDC73, CDH1, CDK4, CDKN1B, CDKN1C, CDKN2A, CEBPA, CHEK2, CTNNA1, DICER1, DIS3L2, EGFR, EPCAM, FH, FLCN, GATA2, GPC3, GREM1, HOXB13, HRAS, KIT, MAX, MEN1, MET, MITF, MLH1, MSH2, MSH3, MSH6, MUTYH, NBN, NF1, NF2, NTHL1, PALB2, PDGFRA, PHOX2B, PMS2, POLD1, POLE, POT1, PRKAR1A, PTCH1, PTEN, RAD50, RAD51C, RAD51D, RB1, RECQL4, RET, RUNX1, SDHA, SDHAF2, SDHB, SDHC, SDHD, SMAD4, SMARCA4, SMARCB1, SMARCE1, STK11, SUFU, TERC, TERT, TMEM127, TP53, TSC1, TSC2, VHL, WRN, WT1).  (2) status post right lumpectomy 02/19/2018 showing ductal carcinoma in situ, high-grade, measuring 3.5 cm, with negative  margins.  (3) adjuvant radiation 03/30/2018 to 04/27/2018 Site/dose:   The patient received 40.05Gy in 82factions to the right breast with a 10 Gy boost in 5 fractions  (4) started tamoxifen 08/31/2018   PLAN: Crystal Trevino is  now just about a year out from her definitive surgery for noninvasive breast cancer, with no evidence of disease activity.  This is favorable.  She is tolerating tamoxifen moderately well.  She is getting some benefit in the nighttime hot flashes from Neurontin, but the daytime hot flashes continue to be a nuisance.  Accordingly we are adding venlafaxine at very low dose.  She is aware of the possible toxicities side effects and complications of this agent and also the fact that she should not stop it abruptly but if she decides to go off the medication she will need to taper it off and we can give her instructions on how to do that.  We are starting at 37.5 mg daily.  She has not had any significant benefit after a couple of weeks she will double the dose and call uKoreaso we can put the appropriate prescription in.  Otherwise the plan is to continue tamoxifen for a total of 5 years.  She sees Dr. MEwing SchleinJanuary and July.  I am going to start seeing her yearly in April, beginning in 2021.  Of course the patient knows to call if any problems develop before that visit    Cynia Abruzzo, GVirgie Dad MD  12/13/18 9:31 AM Medical Oncology and Hematology [facility] [id number]. [phone]   Fax. [phone] I, AJacqualyn Poseyam acting as a sEducation administratorfor GChauncey Cruel MD.   I, GLurline DelMD, have reviewed the above documentation for accuracy and completeness, and I agree with the above.

## 2018-12-13 ENCOUNTER — Inpatient Hospital Stay: Payer: Federal, State, Local not specified - PPO | Attending: Oncology | Admitting: Oncology

## 2018-12-13 ENCOUNTER — Telehealth: Payer: Self-pay | Admitting: Oncology

## 2018-12-13 VITALS — BP 150/63 | HR 72 | Temp 98.6°F | Resp 18 | Wt 174.6 lb

## 2018-12-13 DIAGNOSIS — Z79810 Long term (current) use of selective estrogen receptor modulators (SERMs): Secondary | ICD-10-CM

## 2018-12-13 DIAGNOSIS — Z7982 Long term (current) use of aspirin: Secondary | ICD-10-CM | POA: Insufficient documentation

## 2018-12-13 DIAGNOSIS — D0511 Intraductal carcinoma in situ of right breast: Secondary | ICD-10-CM | POA: Insufficient documentation

## 2018-12-13 DIAGNOSIS — Z79899 Other long term (current) drug therapy: Secondary | ICD-10-CM | POA: Diagnosis not present

## 2018-12-13 DIAGNOSIS — Z170 Estrogen receptor positive status [ER+]: Secondary | ICD-10-CM | POA: Diagnosis not present

## 2018-12-13 MED ORDER — VENLAFAXINE HCL ER 37.5 MG PO CP24: 37.5000 mg | ORAL_CAPSULE | Freq: Every day | ORAL | 4 refills | Status: DC

## 2018-12-13 MED ORDER — GABAPENTIN 300 MG PO CAPS: ORAL_CAPSULE | ORAL | 4 refills | Status: DC

## 2018-12-13 MED ORDER — TAMOXIFEN CITRATE 20 MG PO TABS: 20.0000 mg | ORAL_TABLET | Freq: Every day | ORAL | 4 refills | Status: DC

## 2018-12-13 NOTE — Telephone Encounter (Signed)
Gave avs and calendar ° °

## 2019-01-07 DIAGNOSIS — R7301 Impaired fasting glucose: Secondary | ICD-10-CM | POA: Diagnosis not present

## 2019-01-07 DIAGNOSIS — I10 Essential (primary) hypertension: Secondary | ICD-10-CM | POA: Diagnosis not present

## 2019-01-07 DIAGNOSIS — E782 Mixed hyperlipidemia: Secondary | ICD-10-CM | POA: Diagnosis not present

## 2019-01-07 DIAGNOSIS — E039 Hypothyroidism, unspecified: Secondary | ICD-10-CM | POA: Diagnosis not present

## 2019-01-13 DIAGNOSIS — Z853 Personal history of malignant neoplasm of breast: Secondary | ICD-10-CM | POA: Diagnosis not present

## 2019-01-25 DIAGNOSIS — Z96651 Presence of right artificial knee joint: Secondary | ICD-10-CM | POA: Diagnosis not present

## 2019-02-07 ENCOUNTER — Telehealth: Payer: Self-pay | Admitting: *Deleted

## 2019-02-07 ENCOUNTER — Other Ambulatory Visit: Payer: Self-pay | Admitting: *Deleted

## 2019-02-07 MED ORDER — VENLAFAXINE HCL ER 37.5 MG PO CP24: 37.5000 mg | ORAL_CAPSULE | Freq: Two times a day (BID) | ORAL | 4 refills | Status: DC

## 2019-02-07 NOTE — Telephone Encounter (Signed)
This RN received VM from pt stating she has increased the venlafaxine ER 37.5mg  to 1 twice a day with good benefit of control of hot flashes.  Pt needs new prescription with dose change and increased dispense amount.  New prescription sent to pt's pharmacy.  Message left on pt's identified VM.

## 2019-02-28 ENCOUNTER — Telehealth: Payer: Self-pay

## 2019-02-28 NOTE — Telephone Encounter (Signed)
LVM for patient to call back and reschedule from 4/9 to 4/8, 4/15, or 4/17 for SCP virtual visit with NP.  

## 2019-03-08 ENCOUNTER — Telehealth: Payer: Self-pay | Admitting: Adult Health

## 2019-03-08 NOTE — Telephone Encounter (Signed)
No schedule mailed due to 4/22 is webex visit. Patient will receive another call from the team member handling webex visits.

## 2019-03-08 NOTE — Telephone Encounter (Signed)
Scheduled per sch msg. Called patient, no answer. Left msg. Mailed printout.  °

## 2019-03-10 ENCOUNTER — Encounter: Payer: Federal, State, Local not specified - PPO | Admitting: Adult Health

## 2019-03-23 ENCOUNTER — Inpatient Hospital Stay: Payer: Federal, State, Local not specified - PPO | Admitting: Adult Health

## 2019-03-23 ENCOUNTER — Telehealth: Payer: Self-pay | Admitting: Oncology

## 2019-03-23 NOTE — Telephone Encounter (Signed)
Called patient regarding rescheduled appointment, left patient a voicemail.

## 2019-04-12 ENCOUNTER — Telehealth: Payer: Self-pay | Admitting: Adult Health

## 2019-04-12 NOTE — Telephone Encounter (Signed)
Called patient regarding upcoming Webex appointment, left patient a voicemail and sent an e-mail.

## 2019-04-12 NOTE — Telephone Encounter (Signed)
Tried calling pt re 04/13/19 office visit and could not leave vm.

## 2019-04-13 ENCOUNTER — Inpatient Hospital Stay: Payer: Federal, State, Local not specified - PPO | Attending: Adult Health | Admitting: Adult Health

## 2019-04-13 DIAGNOSIS — D0511 Intraductal carcinoma in situ of right breast: Secondary | ICD-10-CM

## 2019-04-13 NOTE — Progress Notes (Signed)
Patient did not get on webex.  She did not answer phone.  LMOM to call us back.  This is second attempt for SCP visit and no show.  We did mail letters for both appointments in addition to calling, leaving voicemail, and sending email.    Wilber Bihari, NP

## 2019-06-22 DIAGNOSIS — Z87442 Personal history of urinary calculi: Secondary | ICD-10-CM | POA: Diagnosis not present

## 2019-06-22 DIAGNOSIS — Z8742 Personal history of other diseases of the female genital tract: Secondary | ICD-10-CM | POA: Diagnosis not present

## 2019-08-09 DIAGNOSIS — R7301 Impaired fasting glucose: Secondary | ICD-10-CM | POA: Diagnosis not present

## 2019-08-09 DIAGNOSIS — E039 Hypothyroidism, unspecified: Secondary | ICD-10-CM | POA: Diagnosis not present

## 2019-08-09 DIAGNOSIS — I10 Essential (primary) hypertension: Secondary | ICD-10-CM | POA: Diagnosis not present

## 2019-08-09 DIAGNOSIS — E559 Vitamin D deficiency, unspecified: Secondary | ICD-10-CM | POA: Diagnosis not present

## 2019-08-09 DIAGNOSIS — E782 Mixed hyperlipidemia: Secondary | ICD-10-CM | POA: Diagnosis not present

## 2019-08-25 DIAGNOSIS — E782 Mixed hyperlipidemia: Secondary | ICD-10-CM | POA: Diagnosis not present

## 2019-08-25 DIAGNOSIS — R7301 Impaired fasting glucose: Secondary | ICD-10-CM | POA: Diagnosis not present

## 2019-08-25 DIAGNOSIS — I10 Essential (primary) hypertension: Secondary | ICD-10-CM | POA: Diagnosis not present

## 2019-08-25 DIAGNOSIS — E039 Hypothyroidism, unspecified: Secondary | ICD-10-CM | POA: Diagnosis not present

## 2019-09-06 DIAGNOSIS — Z23 Encounter for immunization: Secondary | ICD-10-CM | POA: Diagnosis not present

## 2020-01-01 ENCOUNTER — Ambulatory Visit: Payer: Federal, State, Local not specified - PPO

## 2020-01-06 ENCOUNTER — Ambulatory Visit: Payer: Federal, State, Local not specified - PPO

## 2020-01-07 ENCOUNTER — Ambulatory Visit: Payer: Federal, State, Local not specified - PPO | Attending: Internal Medicine

## 2020-01-07 DIAGNOSIS — Z23 Encounter for immunization: Secondary | ICD-10-CM

## 2020-01-07 NOTE — Progress Notes (Signed)
   Covid-19 Vaccination Clinic  Name:  Crystal Trevino    MRN: [id number] DOB: September 15, 1941  01/07/2020  [name] was observed post Covid-19 immunization for 15 minutes without incidence. She was provided with Vaccine Information Sheet and instruction to access the V-Safe system.   [name] was instructed to call 911 with any severe reactions post vaccine: Marland Kitchen Difficulty breathing  . Swelling of your face and throat  . A fast heartbeat  . A bad rash all over your body  . Dizziness and weakness    Immunizations Administered    Name Date Dose VIS Date Route   Pfizer COVID-19 Vaccine 01/07/2020 11:07 AM 0.3 mL 11/11/2019 Intramuscular   Manufacturer: [place]   Lot: [id number]   [place]: [id number]

## 2020-01-31 ENCOUNTER — Ambulatory Visit: Payer: Federal, State, Local not specified - PPO

## 2020-01-31 ENCOUNTER — Ambulatory Visit: Payer: Federal, State, Local not specified - PPO | Attending: Internal Medicine

## 2020-01-31 DIAGNOSIS — Z23 Encounter for immunization: Secondary | ICD-10-CM | POA: Insufficient documentation

## 2020-01-31 NOTE — Progress Notes (Signed)
   Covid-19 Vaccination Clinic  Name:  Crystal Trevino    MRN: [id number] DOB: May 17, 1941  01/31/2020  Crystal Trevino was observed post Covid-19 immunization for 15 minutes without incident. She was provided with Vaccine Information Sheet and instruction to access the V-Safe system.   Crystal Trevino was instructed to call 911 with any severe reactions post vaccine: Marland Kitchen Difficulty breathing  . Swelling of face and throat  . A fast heartbeat  . A bad rash all over body  . Dizziness and weakness   Immunizations Administered    Name Date Dose VIS Date Route   Pfizer COVID-19 Vaccine 01/31/2020  8:39 AM 0.3 mL 11/11/2019 Intramuscular   Manufacturer: [place]   Lot: [id number]   [place]: [id number]

## 2020-03-09 ENCOUNTER — Other Ambulatory Visit: Payer: Self-pay | Admitting: Oncology

## 2020-03-12 NOTE — Progress Notes (Signed)
[place]  [phone] [phone]) [phone]     ID: Crystal Trevino DOB: 1941-06-04  MR#: [id number]  OAC#:[id number]  Patient Care Team: Crystal Caraway, MD as PCP - General (Family Medicine) Crystal Trevino, Crystal Dad, MD as Consulting Physician (Oncology) Crystal Huger, MD as Consulting Physician (Orthopedic Surgery) Crystal Sleigh, MD as Consulting Physician (Gynecology) Crystal Pray, MD as Consulting Physician (Radiation Oncology) Crystal Overall, MD as Consulting Physician (General Surgery) Crystal Sleigh, MD as Consulting Physician (Gynecology) OTHER MD:  CHIEF COMPLAINT: Estrogen receptor positive DCIS  CURRENT TREATMENT:  tamoxifen   INTERVAL HISTORY: Crystal Trevino returns today for follow-up of her noninvasive breast cancer.   She continues on tamoxifen.  Hot flashes are the major problem.  They usually occur in the afternoon and at night.  The venlafaxine 37.5 mg really is not helping.  The gabapentin at bedtime is helping some  She is scheduled for mammography at [facility] next week.   REVIEW OF SYSTEMS: Crystal Trevino (who is Crystal Trevino's mother) exercises most days by walking about 30 minutes.  She also has an exercise bike at home although she has not started using that.  She retired in October and has been doing a lot of cleaning and getting rid of things she does not need.  She has received both Pfizer vaccine doses which is the reason her mammography was delayed.  A detailed review of systems today was otherwise stable.   HISTORY OF CURRENT ILLNESS: From the original intake note:  "Crystal Trevino" had routine screening mammography on 12/09/2017 showing a possible abnormality in the right breast. She underwent bilateral diagnostic mammography with tomography and right breast ultrasonography at [facility] on 12/18/2017 the breast density to be category C.  There was an area of grouped heterogeneous calcifications in the lower outer quadrant but no significant masses  ultrasonography found no abnormalities in the right axilla.    Accordingly on 12/21/2017 she proceeded to biopsy of the right breast area in question. 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  The patient's subsequent history is as detailed below.   PAST MEDICAL HISTORY: Past Medical History:  Diagnosis Date  . Arthritis   . Breast CA ([place])    right breast cancer  . Cancer ([place])    HAD RIGHT BREAST BX --GOING TO [facility]ER C[facility] ON [facility]ED 12/30/2017  . Constipation 08/13/2011  . Dizziness 08/13/2011  . Family history of adverse reaction to anesthesia    pt mother had PONV  . Family history of breast cancer   . Genetic testing 01/28/2018   Multi-Cancer panel (83 genes) @ [facility] - No pathogenic mutations detected  . GERD (gastroesophageal reflux disease)   . Heart murmur    "SLIGHT HEART MURMUR"  WAS PICKED UP AS AN ADULT  . Hyperlipidemia   . Hypertension   . Hypothyroidism   . Osteoarthritis of knee    Right  . Peripheral vascular disease ([place])   . Reflux   . Splenic artery aneurysm ([facility])    stable 1.5cm by 08/2013 CT; no further surveillance recommended  . Weight loss     PAST SURGICAL HISTORY: Past Surgical History:  Procedure Laterality Date  . ABDOMINAL HYSTERECTOMY    . BACK SURGERY    . BREAST LUMPECTOMY WITH RADIOACTIVE SEED LOCALIZATION Right 02/19/2018   Procedure: BREAST LUMPECTOMY WITH RADIOACTIVE SEED LOCALIZATION. RIGHT;  Surgeon: Crystal Overall, MD;  Location: [facility] OR;  Service: General;  Laterality: Right;  . BREAST SURGERY    . CHOLECYSTECTOMY    . KNEE SURGERY    . LUMBAR LAMINECTOMY    . TOTAL KNEE ARTHROPLASTY Right 01/04/2018  . TOTAL KNEE ARTHROPLASTY Right 01/04/2018   Procedure: TOTAL KNEE ARTHROPLASTY;  Surgeon: Crystal Huger, MD;  Location: [place];  Service: Orthopedics;   Laterality: Right;  Marland Kitchen VAGINAL PROLAPSE REPAIR     2015   DONE IN [place]  . VAGINAL PROLAPSE REPAIR  05/20/2018  Bilateral cataract extraction, Gall bladder removal. Hysterectomy with USO and remaining ovary was later removed.   FAMILY HISTORY  Family History  Problem Relation Age of Onset  . Heart disease Mother   . Breast cancer Mother 20       deceased 27; TAH/BSO (age?)  . Heart disease Father   . Breast cancer Daughter        dx 45s; currenlty 28; Neg BRCA1/BRCA2  . Breast cancer Maternal Aunt 81       deceased 39  Her father passed from heart complications at age 78. Her mother also passed away from heart complications at age 3. She notes that her mother had breast cancer and was diagnosed in her 34's. Her daughter, Crystal Rubin was diagnosed with breast cancer in her 53's. Her daughter was genetically testing in 2011. She denies a family history of ovarian cancer.   GYNECOLOGIC HISTORY:  No LMP recorded. Patient has had a hysterectomy. Menarche: [age] years old Age at first live birth: 79 years old GXP4 LMP: 1987 Contraceptive: oral pills with no complications HRT: Premarin since 1987 until January 2019 Hysterectomy with USO and the remaining ovary was later removed.    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  The patient's son, Crystal Trevino, works in Insurance underwriter in [place], [place]. Her daughter, Crystal Trevino, is a Nutritional therapist at [employer].  The patient's daughter, Crystal Trevino, works in an office in [place].  Patient's son, Crystal Trevino, is a Customer service manager in [place], [place]. She has 4 grandchildren and 1 step grandchild.   ADVANCED DIRECTIVES: Crystal Trevino [phone]   HEALTH MAINTENANCE: Social History   Tobacco Use  . Smoking status: Never Smoker  . Smokeless tobacco: Never Used  Substance Use Topics  . Alcohol use: No  . Drug use: No      Colonoscopy: [facility] due this year  PAP:   Bone density: March 2013 at [facility] T score -0.6 normal   Allergies  Allergen Reactions  . Penicillins Nausea And Vomiting, Rash and Other (See Comments)    PATIENT HAS HAD A PCN REACTION WITH IMMEDIATE RASH, FACIAL/TONGUE/THROAT SWELLING, SOB, OR LIGHTHEADEDNESS WITH HYPOTENSION:  #  #  #  YES  #  #  #   Has patient had a PCN reaction causing severe rash involving mucus membranes or skin necrosis: No Has patient had a PCN reaction that required hospitalization: No Has patient had a PCN reaction occurring within the last 10 years: No     Current Outpatient Medications  Medication Sig Dispense Refill  . aspirin EC 81 MG tablet Take 81 mg by mouth daily.    . carvedilol (COREG) 12.5 MG tablet Take 12.5 mg by mouth 2 (two) times daily with a meal.      . fluticasone (FLONASE) 50 MCG/ACT nasal spray Place 1 spray into both  nostrils daily as needed for allergies or rhinitis.    Marland Kitchen gabapentin (NEURONTIN) 300 MG capsule Take 1 capsule (300 mg total) by mouth at bedtime. 90 capsule 4  . levothyroxine (SYNTHROID, LEVOTHROID) 75 MCG tablet Take 75 mcg by mouth daily before breakfast.     . Multiple Vitamin (MULTIVITAMIN PO) Take 1 tablet by mouth daily.      . simvastatin (ZOCOR) 20 MG tablet Take 20 mg by mouth at bedtime.      . tamoxifen (NOLVADEX) 20 MG tablet Take 1 tablet (20 mg total) by mouth daily. 90 tablet 4  . telmisartan (MICARDIS) 20 MG tablet Take 20 mg by mouth daily.    Marland Kitchen venlafaxine (EFFEXOR) 75 MG tablet Take 1 tablet (75 mg total) by mouth 2 (two) times daily. 120 tablet 6   No current facility-administered medications for this visit.    OBJECTIVE:  white woman in no acute distress  Vitals:   03/13/20 0911  BP: 139/73  Pulse: 78  Resp: 18  Temp: 98.3 F (36.8 C)  SpO2: 100%     Body mass index is 26.42 kg/m.   Wt Readings from Last 3 Encounters:  03/13/20 163 lb 11.2 oz (74.3 kg)  12/13/18 174 lb 9.6 oz  (79.2 kg)  06/07/18 167 lb 9.6 oz (76 kg)      ECOG FS:1 - Symptomatic but completely ambulatory  Sclerae unicteric, EOMs intact Wearing a mask No cervical or supraclavicular adenopathy Lungs no rales or rhonchi Heart regular rate and rhythm Abd soft, nontender, positive bowel sounds MSK no focal spinal tenderness, no upper extremity lymphedema Neuro: nonfocal, well oriented, appropriate affect Breasts: The right breast has undergone lumpectomy followed by radiation with no evidence of local recurrence.  The left breast is benign.  Both axillae are benign.   LAB RESULTS:  CMP     Component Value Date/Time   NA 137 02/19/2018 0924   K 4.1 02/19/2018 0924   CL 104 02/19/2018 0924   CO2 24 02/19/2018 0924   GLUCOSE 92 02/19/2018 0924   BUN 15 02/19/2018 0924   CREATININE 0.77 02/19/2018 0924   CREATININE 0.94 08/26/2013 1710   CALCIUM 9.2 02/19/2018 0924   PROT 6.6 12/28/2017 1004   ALBUMIN 4.0 12/28/2017 1004   AST 23 12/28/2017 1004   ALT 18 12/28/2017 1004   ALKPHOS 59 12/28/2017 1004   BILITOT 1.3 (H) 12/28/2017 1004   GFRNONAA >60 02/19/2018 0924   GFRAA >60 02/19/2018 0924    No results found for: TOTALPROTELP, ALBUMINELP, A1GS, A2GS, BETS, BETA2SER, GAMS, MSPIKE, SPEI  No results found for: KPAFRELGTCHN, LAMBDASER, KAPLAMBRATIO  Lab Results  Component Value Date   WBC 6.4 02/19/2018   NEUTROABS 4.4 12/28/2017   HGB 11.0 (L) 02/19/2018   HCT 34.8 (L) 02/19/2018   MCV 94.1 02/19/2018   PLT 235 02/19/2018   No results found for: LABCA2  No components found for: [id number]  No results for input(s): INR in the last 168 hours.  No results found for: LABCA2  No results found for: KGY185  No results found for: UDJ497  No results found for: WYO378  No results found for: CA2729  No components found for: HGQUANT  No results found for: CEA1 / No results found for: CEA1   No results found for: AFPTUMOR  No results found for: CHROMOGRNA  No results  found for: HGBA, HGBA2QUANT, HGBFQUANT, HGBSQUAN (Hemoglobinopathy evaluation)   No results found for: LDH  No results found for: IRON, TIBC, IRONPCTSAT (  Iron and TIBC)  No results found for: FERRITIN  Urinalysis No results found for: COLORURINE, APPEARANCEUR, LABSPEC, PHURINE, GLUCOSEU, HGBUR, BILIRUBINUR, KETONESUR, PROTEINUR, UROBILINOGEN, NITRITE, LEUKOCYTESUR   STUDIES: No results found.   ELIGIBLE FOR AVAILABLE RESEARCH PROTOCOL: no  ASSESSMENT: 79 y.o. [place], [place] Woman status post right breast lower outer quadrant biopsy on 12/21/2017 for a ductal carcinoma in situ, high-grade, estrogen and progesterone receptor positive  (1) genetics testing 01/28/2018 through  [facility]'s Multi-Cancer panel found no deleterious mutations in (ALK, APC, ATM, AXIN2, BAP1, BARD1, BLM, BMPR1A, BRCA1, BRCA2, BRIP1, CASR, CDC73, CDH1, CDK4, CDKN1B, CDKN1C, CDKN2A, CEBPA, CHEK2, CTNNA1, DICER1, DIS3L2, EGFR, EPCAM, FH, FLCN, GATA2, GPC3, GREM1, HOXB13, HRAS, KIT, MAX, MEN1, MET, MITF, MLH1, MSH2, MSH3, MSH6, MUTYH, NBN, NF1, NF2, NTHL1, PALB2, PDGFRA, PHOX2B, PMS2, POLD1, POLE, POT1, PRKAR1A, PTCH1, PTEN, RAD50, RAD51C, RAD51D, RB1, RECQL4, RET, RUNX1, SDHA, SDHAF2, SDHB, SDHC, SDHD, SMAD4, SMARCA4, SMARCB1, SMARCE1, STK11, SUFU, TERC, TERT, TMEM127, TP53, TSC1, TSC2, VHL, WRN, WT1).  (2) status post right lumpectomy 02/19/2018 showing ductal carcinoma in situ, high-grade, measuring 3.5 cm, with negative margins.  (3) adjuvant radiation 03/30/2018 to 04/27/2018 Site/dose:   The patient received 40.05Gy in 42factions to the right breast with a 10 Gy boost in 5 fractions  (4) started tamoxifen 08/31/2018   PLAN: Crystal Trevino is now just over 2 years out from definitive surgery for her breast cancer with no evidence of disease recurrence.  This is very febrile.  The plan is to continue tamoxifen to total of 5 years.  I have refilled her gabapentin for the nighttime hot flashes.  I am increasing the  venlafaxine to 75 mg twice daily and if that does not work we can consider increasing the dose further.  I commended her on her exercise program and suggested she extended to 45 minutes 5 days a week.  She knows to call for any other issue that may develop before the next visit  Total encounter time 25 minutes.*    Adrean Findlay, GVirgie Dad MD  03/13/20 9:35 AM [facility] [address]Tel. [phone]   Fax. [phone]  I, KWilburn Mylar am acting as scribe for Dr. GVirgie Trevino Emmilyn Crooke.  I, GLurline DelMD, have reviewed the above documentation for accuracy and completeness, and I agree with the above.    *Total Encounter Time as defined by the Centers for Medicare and Medicaid Services includes, in addition to the face-to-face time of a patient visit (documented in the note above) non-face-to-face time: obtaining and reviewing outside history, ordering and reviewing medications, tests or procedures, care coordination (communications with other health care professionals or caregivers) and documentation in the medical record.

## 2020-03-13 ENCOUNTER — Other Ambulatory Visit: Payer: Self-pay

## 2020-03-13 ENCOUNTER — Inpatient Hospital Stay: Payer: Federal, State, Local not specified - PPO | Attending: Oncology | Admitting: Oncology

## 2020-03-13 VITALS — BP 139/73 | HR 78 | Temp 98.3°F | Resp 18 | Ht 66.0 in | Wt 163.7 lb

## 2020-03-13 DIAGNOSIS — M129 Arthropathy, unspecified: Secondary | ICD-10-CM | POA: Diagnosis not present

## 2020-03-13 DIAGNOSIS — Z170 Estrogen receptor positive status [ER+]: Secondary | ICD-10-CM | POA: Insufficient documentation

## 2020-03-13 DIAGNOSIS — C50511 Malignant neoplasm of lower-outer quadrant of right female breast: Secondary | ICD-10-CM | POA: Insufficient documentation

## 2020-03-13 DIAGNOSIS — K5900 Constipation, unspecified: Secondary | ICD-10-CM | POA: Insufficient documentation

## 2020-03-13 DIAGNOSIS — R634 Abnormal weight loss: Secondary | ICD-10-CM | POA: Diagnosis not present

## 2020-03-13 DIAGNOSIS — E785 Hyperlipidemia, unspecified: Secondary | ICD-10-CM | POA: Diagnosis not present

## 2020-03-13 DIAGNOSIS — E039 Hypothyroidism, unspecified: Secondary | ICD-10-CM | POA: Insufficient documentation

## 2020-03-13 DIAGNOSIS — I739 Peripheral vascular disease, unspecified: Secondary | ICD-10-CM | POA: Insufficient documentation

## 2020-03-13 DIAGNOSIS — R011 Cardiac murmur, unspecified: Secondary | ICD-10-CM | POA: Insufficient documentation

## 2020-03-13 DIAGNOSIS — K219 Gastro-esophageal reflux disease without esophagitis: Secondary | ICD-10-CM | POA: Insufficient documentation

## 2020-03-13 DIAGNOSIS — D0511 Intraductal carcinoma in situ of right breast: Secondary | ICD-10-CM | POA: Diagnosis not present

## 2020-03-13 DIAGNOSIS — Z7982 Long term (current) use of aspirin: Secondary | ICD-10-CM | POA: Diagnosis not present

## 2020-03-13 DIAGNOSIS — Z79899 Other long term (current) drug therapy: Secondary | ICD-10-CM | POA: Diagnosis not present

## 2020-03-13 DIAGNOSIS — Z803 Family history of malignant neoplasm of breast: Secondary | ICD-10-CM | POA: Insufficient documentation

## 2020-03-13 DIAGNOSIS — R232 Flushing: Secondary | ICD-10-CM | POA: Insufficient documentation

## 2020-03-13 DIAGNOSIS — Z79810 Long term (current) use of selective estrogen receptor modulators (SERMs): Secondary | ICD-10-CM | POA: Diagnosis not present

## 2020-03-13 DIAGNOSIS — I10 Essential (primary) hypertension: Secondary | ICD-10-CM | POA: Insufficient documentation

## 2020-03-13 DIAGNOSIS — M1711 Unilateral primary osteoarthritis, right knee: Secondary | ICD-10-CM | POA: Diagnosis not present

## 2020-03-13 MED ORDER — VENLAFAXINE HCL 75 MG PO TABS: 75.0000 mg | ORAL_TABLET | Freq: Two times a day (BID) | ORAL | 6 refills | Status: DC

## 2020-03-13 MED ORDER — TAMOXIFEN CITRATE 20 MG PO TABS: 20.0000 mg | ORAL_TABLET | Freq: Every day | ORAL | 4 refills | Status: DC

## 2020-03-13 MED ORDER — GABAPENTIN 300 MG PO CAPS: 300.0000 mg | ORAL_CAPSULE | Freq: Every day | ORAL | 4 refills | Status: DC

## 2020-03-14 ENCOUNTER — Telehealth: Payer: Self-pay | Admitting: Oncology

## 2020-03-14 NOTE — Telephone Encounter (Signed)
Scheduled appt per 4/13 los. Pt confirmed appt date and time.  

## 2020-04-24 ENCOUNTER — Other Ambulatory Visit: Payer: Self-pay | Admitting: Oncology

## 2020-05-07 ENCOUNTER — Other Ambulatory Visit: Payer: Self-pay | Admitting: Oncology

## 2021-01-01 DIAGNOSIS — I639 Cerebral infarction, unspecified: Secondary | ICD-10-CM

## 2021-01-01 HISTORY — DX: Cerebral infarction, unspecified: I63.9

## 2021-01-06 ENCOUNTER — Encounter (HOSPITAL_COMMUNITY): Payer: Self-pay | Admitting: *Deleted

## 2021-01-06 ENCOUNTER — Emergency Department (HOSPITAL_COMMUNITY): Payer: Medicare Other

## 2021-01-06 ENCOUNTER — Observation Stay (HOSPITAL_COMMUNITY): Payer: Medicare Other

## 2021-01-06 ENCOUNTER — Inpatient Hospital Stay (HOSPITAL_COMMUNITY)
Admission: EM | Admit: 2021-01-06 | Discharge: 2021-01-11 | DRG: 042 | Disposition: A | Discharge status: Rehabilitation Facility | Payer: Medicare Other | Attending: Internal Medicine | Admitting: Internal Medicine

## 2021-01-06 ENCOUNTER — Other Ambulatory Visit: Payer: Self-pay

## 2021-01-06 DIAGNOSIS — Z66 Do not resuscitate: Secondary | ICD-10-CM | POA: Diagnosis present

## 2021-01-06 DIAGNOSIS — R4781 Slurred speech: Secondary | ICD-10-CM

## 2021-01-06 DIAGNOSIS — Z96651 Presence of right artificial knee joint: Secondary | ICD-10-CM | POA: Diagnosis present

## 2021-01-06 DIAGNOSIS — I080 Rheumatic disorders of both mitral and aortic valves: Secondary | ICD-10-CM | POA: Diagnosis present

## 2021-01-06 DIAGNOSIS — R7303 Prediabetes: Secondary | ICD-10-CM | POA: Diagnosis present

## 2021-01-06 DIAGNOSIS — R29703 NIHSS score 3: Secondary | ICD-10-CM | POA: Diagnosis present

## 2021-01-06 DIAGNOSIS — Z79810 Long term (current) use of selective estrogen receptor modulators (SERMs): Secondary | ICD-10-CM

## 2021-01-06 DIAGNOSIS — Z803 Family history of malignant neoplasm of breast: Secondary | ICD-10-CM

## 2021-01-06 DIAGNOSIS — Z8249 Family history of ischemic heart disease and other diseases of the circulatory system: Secondary | ICD-10-CM

## 2021-01-06 DIAGNOSIS — I639 Cerebral infarction, unspecified: Secondary | ICD-10-CM

## 2021-01-06 DIAGNOSIS — G459 Transient cerebral ischemic attack, unspecified: Secondary | ICD-10-CM | POA: Diagnosis present

## 2021-01-06 DIAGNOSIS — I10 Essential (primary) hypertension: Secondary | ICD-10-CM | POA: Diagnosis not present

## 2021-01-06 DIAGNOSIS — K219 Gastro-esophageal reflux disease without esophagitis: Secondary | ICD-10-CM | POA: Diagnosis present

## 2021-01-06 DIAGNOSIS — Z79899 Other long term (current) drug therapy: Secondary | ICD-10-CM

## 2021-01-06 DIAGNOSIS — Z90710 Acquired absence of both cervix and uterus: Secondary | ICD-10-CM

## 2021-01-06 DIAGNOSIS — D649 Anemia, unspecified: Secondary | ICD-10-CM | POA: Diagnosis present

## 2021-01-06 DIAGNOSIS — Z9049 Acquired absence of other specified parts of digestive tract: Secondary | ICD-10-CM

## 2021-01-06 DIAGNOSIS — E039 Hypothyroidism, unspecified: Secondary | ICD-10-CM | POA: Diagnosis present

## 2021-01-06 DIAGNOSIS — I739 Peripheral vascular disease, unspecified: Secondary | ICD-10-CM | POA: Diagnosis present

## 2021-01-06 DIAGNOSIS — E785 Hyperlipidemia, unspecified: Secondary | ICD-10-CM | POA: Diagnosis present

## 2021-01-06 DIAGNOSIS — Z880 Allergy status to penicillin: Secondary | ICD-10-CM

## 2021-01-06 DIAGNOSIS — R4701 Aphasia: Secondary | ICD-10-CM | POA: Diagnosis present

## 2021-01-06 DIAGNOSIS — I63412 Cerebral infarction due to embolism of left middle cerebral artery: Secondary | ICD-10-CM | POA: Diagnosis not present

## 2021-01-06 DIAGNOSIS — R29810 Facial weakness: Secondary | ICD-10-CM | POA: Diagnosis present

## 2021-01-06 DIAGNOSIS — Z7982 Long term (current) use of aspirin: Secondary | ICD-10-CM

## 2021-01-06 DIAGNOSIS — G3184 Mild cognitive impairment, so stated: Secondary | ICD-10-CM | POA: Diagnosis present

## 2021-01-06 DIAGNOSIS — Z923 Personal history of irradiation: Secondary | ICD-10-CM

## 2021-01-06 DIAGNOSIS — D0511 Intraductal carcinoma in situ of right breast: Secondary | ICD-10-CM | POA: Diagnosis present

## 2021-01-06 DIAGNOSIS — Z20822 Contact with and (suspected) exposure to covid-19: Secondary | ICD-10-CM | POA: Diagnosis present

## 2021-01-06 DIAGNOSIS — R471 Dysarthria and anarthria: Secondary | ICD-10-CM | POA: Diagnosis present

## 2021-01-06 LAB — DIFFERENTIAL
Abs Immature Granulocytes: 0.02 10*3/uL (ref 0.00–0.07)
Basophils Absolute: 0 10*3/uL (ref 0.0–0.1)
Basophils Relative: 1 %
Eosinophils Absolute: 0.2 10*3/uL (ref 0.0–0.5)
Eosinophils Relative: 4 %
Immature Granulocytes: 0 %
Lymphocytes Relative: 25 %
Lymphs Abs: 1.2 10*3/uL (ref 0.7–4.0)
Monocytes Absolute: 0.5 10*3/uL (ref 0.1–1.0)
Monocytes Relative: 11 %
Neutro Abs: 3 10*3/uL (ref 1.7–7.7)
Neutrophils Relative %: 59 %

## 2021-01-06 LAB — I-STAT CHEM 8, ED
BUN: 22 mg/dL (ref 8–23)
Calcium, Ion: 1.1 mmol/L — ABNORMAL LOW (ref 1.15–1.40)
Chloride: 103 mmol/L (ref 98–111)
Creatinine, Ser: 0.7 mg/dL (ref 0.44–1.00)
Glucose, Bld: 130 mg/dL — ABNORMAL HIGH (ref 70–99)
HCT: 33 % — ABNORMAL LOW (ref 36.0–46.0)
Hemoglobin: 11.2 g/dL — ABNORMAL LOW (ref 12.0–15.0)
Potassium: 4 mmol/L (ref 3.5–5.1)
Sodium: 137 mmol/L (ref 135–145)
TCO2: 24 mmol/L (ref 22–32)

## 2021-01-06 LAB — COMPREHENSIVE METABOLIC PANEL
ALT: 11 U/L (ref 0–44)
AST: 18 U/L (ref 15–41)
Albumin: 3.4 g/dL — ABNORMAL LOW (ref 3.5–5.0)
Alkaline Phosphatase: 52 U/L (ref 38–126)
Anion gap: 11 (ref 5–15)
BUN: 19 mg/dL (ref 8–23)
CO2: 23 mmol/L (ref 22–32)
Calcium: 9 mg/dL (ref 8.9–10.3)
Chloride: 103 mmol/L (ref 98–111)
Creatinine, Ser: 0.88 mg/dL (ref 0.44–1.00)
GFR, Estimated: >60 mL/min (ref >60)
Glucose, Bld: 141 mg/dL — ABNORMAL HIGH (ref 70–99)
Potassium: 4 mmol/L (ref 3.5–5.1)
Sodium: 137 mmol/L (ref 135–145)
Total Bilirubin: 0.6 mg/dL (ref 0.3–1.2)
Total Protein: 6.1 g/dL — ABNORMAL LOW (ref 6.5–8.1)

## 2021-01-06 LAB — CBC
HCT: 32.5 % — ABNORMAL LOW (ref 36.0–46.0)
Hemoglobin: 10.3 g/dL — ABNORMAL LOW (ref 12.0–15.0)
MCH: 30.7 pg (ref 26.0–34.0)
MCHC: 31.7 g/dL (ref 30.0–36.0)
MCV: 97 fL (ref 80.0–100.0)
Platelets: 190 10*3/uL (ref 150–400)
RBC: 3.35 MIL/uL — ABNORMAL LOW (ref 3.87–5.11)
RDW: 14.1 % (ref 11.5–15.5)
WBC: 5 10*3/uL (ref 4.0–10.5)
nRBC: 0 % (ref 0.0–0.2)

## 2021-01-06 LAB — SARS CORONAVIRUS 2 BY RT PCR (HOSPITAL ORDER, PERFORMED IN ~~LOC~~ HOSPITAL LAB): SARS Coronavirus 2: NEGATIVE

## 2021-01-06 LAB — APTT: aPTT: 26 seconds (ref 24–36)

## 2021-01-06 LAB — CBG MONITORING, ED: Glucose-Capillary: 123 mg/dL — ABNORMAL HIGH (ref 70–99)

## 2021-01-06 LAB — PROTIME-INR
INR: 0.9 (ref 0.8–1.2)
Prothrombin Time: 12 seconds (ref 11.4–15.2)

## 2021-01-06 MED ORDER — CLOPIDOGREL BISULFATE 75 MG PO TABS: 75.0000 mg | ORAL_TABLET | Freq: Every day | ORAL | Status: DC

## 2021-01-06 MED ORDER — STROKE: EARLY STAGES OF RECOVERY BOOK
Freq: Once | Status: DC
  Filled 2021-01-06 (×2): qty 1

## 2021-01-06 MED ORDER — ASPIRIN 81 MG PO CHEW
324.0000 mg | CHEWABLE_TABLET | Freq: Once | ORAL | Status: AC
  Administered 2021-01-06: 324 mg via ORAL
  Filled 2021-01-06: qty 4

## 2021-01-06 MED ORDER — ASPIRIN EC 81 MG PO TBEC
81.0000 mg | DELAYED_RELEASE_TABLET | Freq: Every day | ORAL | Status: DC
Start: 2021-01-07 — End: 2021-01-11
  Administered 2021-01-07 – 2021-01-11 (×5): 81 mg via ORAL
  Filled 2021-01-06 (×5): qty 1

## 2021-01-06 MED ORDER — ACETAMINOPHEN 650 MG RE SUPP: 650.0000 mg | RECTAL | Status: DC | PRN

## 2021-01-06 MED ORDER — LABETALOL HCL 5 MG/ML IV SOLN: 10.0000 mg | INTRAVENOUS | Status: DC | PRN

## 2021-01-06 MED ORDER — LEVOTHYROXINE SODIUM 75 MCG PO TABS
75.0000 ug | ORAL_TABLET | Freq: Every day | ORAL | Status: DC
  Administered 2021-01-07 – 2021-01-11 (×5): 75 ug via ORAL
  Filled 2021-01-06 (×5): qty 1

## 2021-01-06 MED ORDER — SIMVASTATIN 20 MG PO TABS
20.0000 mg | ORAL_TABLET | Freq: Every day | ORAL | Status: DC
  Administered 2021-01-06 – 2021-01-07 (×2): 20 mg via ORAL
  Filled 2021-01-06 (×2): qty 1

## 2021-01-06 MED ORDER — ACETAMINOPHEN 160 MG/5ML PO SOLN: 650.0000 mg | ORAL | Status: DC | PRN

## 2021-01-06 MED ORDER — IOHEXOL 350 MG/ML SOLN
75.0000 mL | Freq: Once | INTRAVENOUS | Status: AC | PRN
  Administered 2021-01-06: 60 mL via INTRAVENOUS

## 2021-01-06 MED ORDER — SODIUM CHLORIDE 0.9% FLUSH
3.0000 mL | Freq: Once | INTRAVENOUS | Status: AC
  Administered 2021-01-06: 3 mL via INTRAVENOUS

## 2021-01-06 MED ORDER — ACETAMINOPHEN 325 MG PO TABS
650.0000 mg | ORAL_TABLET | ORAL | Status: DC | PRN
  Administered 2021-01-07 – 2021-01-08 (×2): 650 mg via ORAL
  Filled 2021-01-06 (×2): qty 2

## 2021-01-06 MED ORDER — SENNOSIDES-DOCUSATE SODIUM 8.6-50 MG PO TABS
1.0000 | ORAL_TABLET | Freq: Every evening | ORAL | Status: DC | PRN
  Administered 2021-01-09: 1 via ORAL
  Filled 2021-01-06: qty 1

## 2021-01-06 MED ORDER — ENOXAPARIN SODIUM 40 MG/0.4ML ~~LOC~~ SOLN
40.0000 mg | SUBCUTANEOUS | Status: DC
  Administered 2021-01-06 – 2021-01-10 (×5): 40 mg via SUBCUTANEOUS
  Filled 2021-01-06 (×5): qty 0.4

## 2021-01-06 MED ORDER — CLOPIDOGREL BISULFATE 75 MG PO TABS
75.0000 mg | ORAL_TABLET | Freq: Every day | ORAL | Status: DC
  Administered 2021-01-07 – 2021-01-11 (×5): 75 mg via ORAL
  Filled 2021-01-06 (×5): qty 1

## 2021-01-06 NOTE — Consult Note (Signed)
Neurology Code Stroke Consult H&P  CC: CODE STROKE  History is obtained from: EMS, daughter  HPI: Crystal Trevino is a 80 y.o. female with PMHx of HLD, HTN, PVD, and hypothyroidism. Patient presents to the ED via EMS as a code stroke. Per EMS, patient was found to have slurred speech which was slightly non sensical at 1500 hrs. By the time EMS arrived, her symptoms had resolved. On route to ED, symptoms returned of slurred speech, difficulty finding her words, and right facial droop. Patient has no hx of TIA or stroke. No FMHx of stroke.   Patient was taken emergently to CT. CT head without acute abnormalities. CTA head and neck negative for LVO.   Per daughter, she and patient were on the phone and had a normal conversation at 1500 hrs. After the conversation, patient was found to have slurred speech with some non sensical speech by her roommate. 911 was called. Symptoms resolved but returned on route. Per daughter, no hx of dementia. Patient is independent at home and generally healthy.   LKW: 4562 tpa given?: No, not a candidate IR Thrombectomy? No, no LVO  MRS-0  NIHSS:  1a Level of Conscious.: 0 1b LOC Questions: 0 1c LOC Commands: 0 2 Best Gaze: 0 3 Visual: 0 4 Facial Palsy: 0 5a Motor Arm - left: 0 5b Motor Arm - Right: 0 6a Motor Leg - Left: 0 6b Motor Leg - Right: 0 7 Limb Ataxia: 0 8 Sensory: 1 9 Best Language: 0 10 Dysarthria: 1 11 Extinct. and Inatten.: 1 TOTAL: 3   ROS: Unable to complete 14 point ROS due to emergency event. Patient denies dysphagia, pain, and weakness.   Past Medical History:  Diagnosis Date  . Arthritis   . Breast CA ([place])    right breast cancer  . Cancer ([place])    HAD RIGHT BREAST BX --GOING TO [facility]ER [facility] 12/30/2017  . Constipation 08/13/2011  . Dizziness 08/13/2011  . Family history of adverse reaction to anesthesia    pt mother had PONV  . Family history of breast cancer   . Genetic testing 01/28/2018   Multi-Cancer panel  (83 genes) @ [facility] - No pathogenic mutations detected  . GERD (gastroesophageal reflux disease)   . Heart murmur    "SLIGHT HEART MURMUR"  WAS PICKED UP AS AN ADULT  . Hyperlipidemia   . Hypertension   . Hypothyroidism   . Osteoarthritis of knee    Right  . Peripheral vascular disease ([place])   . Reflux   . Splenic artery aneurysm ([facility])    stable 1.5cm by 08/2013 CT; no further surveillance recommended  . Weight loss      Family History  Problem Relation Age of Onset  . Heart disease Mother   . Breast cancer Mother 73       deceased 57; TAH/BSO (age?)  . Heart disease Father   . Breast cancer Daughter        dx 2s; currenlty 52; Neg BRCA1/BRCA2  . Breast cancer Maternal Aunt 44       deceased 28    Social History:  reports that she has never smoked. She has never used smokeless tobacco. She reports that she does not drink alcohol and does not use drugs.   Prior to Admission medications   Medication Sig Start Date End Date Taking? Authorizing Provider  aspirin EC 81 MG tablet Take 81 mg by mouth daily.    [provider]  carvedilol (  COREG) 12.5 MG tablet Take 12.5 mg by mouth 2 (two) times daily with a meal.      [provider]  fluticasone (FLONASE) 50 MCG/ACT nasal spray Place 1 spray into both nostrils daily as needed for allergies or rhinitis.    [provider]  gabapentin (NEURONTIN) 300 MG capsule Take 1 capsule (300 mg total) by mouth at bedtime. 03/13/20   Magrinat, Valentino Hue, MD  levothyroxine (SYNTHROID, LEVOTHROID) 75 MCG tablet Take 75 mcg by mouth daily before breakfast.     [provider]  Multiple Vitamin (MULTIVITAMIN PO) Take 1 tablet by mouth daily.      [provider]  simvastatin (ZOCOR) 20 MG tablet Take 20 mg by mouth at bedtime.      [provider]  tamoxifen (NOLVADEX) 20 MG tablet Take 1 tablet (20 mg total) by mouth daily. 03/13/20   Magrinat, Valentino Hue, MD  telmisartan (MICARDIS) 20 MG tablet  Take 20 mg by mouth daily.    [provider]  venlafaxine (EFFEXOR) 75 MG tablet Take 1 tablet (75 mg total) by mouth 2 (two) times daily. 03/13/20   Magrinat, Valentino Hue, MD    Exam: Current vital signs: Wt 74.8 kg   BMI 26.63 kg/m  BP 178/110 and CBG 156 in field. BP 166/96 in ED.   Physical Exam  Constitutional: Appears well-developed and well-nourished.  Psych: Affect appropriate to situation Eyes: No scleral injection HENT: No OP obstrucion Head: Normocephalic.  Cardiovascular: RRR on tele Respiratory: Effort normal. SaO2 GI: Soft.  Skin: WDI  Neuro: Mental Status: Patient is awake, alert, oriented to person, place, month, year, and situation. Patient is able to give a clear and coherent history. No signs of aphasia or neglect. Speech/Language: Speech slight bradyphasia which improved on 2nd exam. Comprehension slightly delayed. Repetition and naming intact.  Cranial Nerves: II: Visual Fields are full. Pupils are equal, round, and reactive to light. III,IV, VI: EOMI without ptosis or diploplia.  V: Facial sensation is symmetric to light touch. Able to move jaw back and forth.  VII: Smile is symmetrical. Able to puff cheeks and raise eyebrows.  VIII: hearing is intact to voice IX, X: Uvula elevates symmetrically. Phonation normal.  XI: Shoulder shrug is symmetric. XII: tongue is midline without atrophy or fasciculations.  Motor: Tone is normal. Bulk is normal. 5/5 strength was present in all four extremities. Sensory: Sensation is symmetric to light touch ini the arms and legs. When examiner touches bilateral UEs and LEs, she states NP is only touching the left.   Plantars:Toes are downgoing bilaterally. Cerebellar: FNF and HKS are intact bilaterally. No drift UE/LE.  Gait: deferred.   I have reviewed labs in epic and the pertinent results are: INR 0.9      Dr. Avie Arenas reviewed the images obtained:  NCT head showed  1. No evidence of acute intracranial  abnormality. 2. ASPECTS is 10.  CTA head and neck showed no LVO. Official read pending at time of note.   Assessment:80 yo female with vascular risk factors of HLD, HTN, and PVD. Patient presented to ED as a code stroke after having stroke like symptoms x 2 since 1500 hrs. She was found to have slurred speech on arrival. Her facial droop had resolved. This is likely TIA but will monitor for symptoms that do not resolve in less than 24 hrs. Will admit patient for stroke workup.   Plan discussed with patient and daughter.   Impression:  1. TIA vs  stroke 2. HTN 3. HLD  Plan: - Medicine admit. - MRI brain without contrast. - Recommend TTE. - Recommend labs: HbA1c, lipid panel, TSH.  - Recommend increase dose of statin if LDL > 70 - Aspirin $RemoveB'325mg'yAZJqmWE$  now (ordered) then $RemoveBefore'81mg'[id number]$  daily. - Clopidogrel $RemoveBefor'75mg'hTstrOaYtmCs$  daily for 3 weeks. - SBP goal - Permissive hypertension first 24 h < 220/110. Hold home medications for now. - Telemetry monitoring for arrhythmia. - Recommend bedside Swallow screen. - Recommend Stroke education. - Recommend PT/OT/SLP consult. - Recommend metabolic/infectious workup with CBC, CMP, UA with UCx, CXR, CK, serum lactate.   This patient is critically ill and at significant risk of neurological worsening, death and care requires constant monitoring of vital signs, hemodynamics,respiratory and cardiac monitoring, neurological assessment, discussion with family, other specialists and medical decision making of high complexity. I spent 73 minutes of neurocritical care time  in the care of  this patient. This was time spent independent of any time provided by nurse practitioner or PA.   Lynnae Sandhoff, MD Page: [phone]

## 2021-01-06 NOTE — ED Notes (Signed)
Pt to MRI

## 2021-01-06 NOTE — ED Notes (Signed)
Do not see MRI ordered.  Have notified Dr Cheyenne Adas.

## 2021-01-06 NOTE — ED Triage Notes (Signed)
Pt with acute onset garbled speech and R sided facial droop at 1600.  When [facility] arrived, all s/s had resolved.  But in the truck, symptoms returned at 1635.

## 2021-01-06 NOTE — ED Provider Notes (Signed)
[facility] EMERGENCY DEPARTMENT Provider Note   CSN: [id number] Arrival date & time: 01/06/21  1641     History Chief Complaint  Patient presents with  . Code Stroke    Crystal Trevino is a 80 y.o. female history of breast cancer, hypertension here presenting with slurred speech.  Patient had slurred speech around 4 PM.  Patient also has some right facial droop.  Apparently when EMS got there, the symptoms have resolved.  But while she was in the EMS truck, symptoms returned at 4:35 PM.  Patient has no history of stroke in the past.  Patient is not on any blood thinners. Code stroke activated by EMS.   The history is provided by the patient.       Past Medical History:  Diagnosis Date  . Arthritis   . Breast CA ([place])    right breast cancer  . Cancer ([place])    HAD RIGHT BREAST BX --GOING [facility] 12/30/2017  . Constipation 08/13/2011  . Dizziness 08/13/2011  . Family history of adverse reaction to anesthesia    pt mother had PONV  . Family history of breast cancer   . Genetic testing 01/28/2018   Multi-Cancer panel (83 genes) @ [facility] - No pathogenic mutations detected  . GERD (gastroesophageal reflux disease)   . Heart murmur    "SLIGHT HEART MURMUR"  WAS PICKED UP AS AN ADULT  . Hyperlipidemia   . Hypertension   . Hypothyroidism   . Osteoarthritis of knee    Right  . Peripheral vascular disease ([place])   . Reflux   . Splenic artery aneurysm ([facility])    stable 1.5cm by 08/2013 CT; no further surveillance recommended  . Weight loss     Patient Active Problem List   Diagnosis Date Noted  . Genetic testing 01/28/2018  . S/P total knee replacement 01/04/2018  . Family history of breast cancer   . 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 Aneurysm of splenic artery ([place]) 08/23/2012    Past Surgical History:  Procedure Laterality Date  . ABDOMINAL HYSTERECTOMY    . BACK SURGERY    . BREAST LUMPECTOMY WITH  RADIOACTIVE SEED LOCALIZATION Right 02/19/2018   Procedure: BREAST LUMPECTOMY WITH RADIOACTIVE SEED LOCALIZATION. RIGHT;  Surgeon: Alphonsa Overall, MD;  Location: [facility];  Service: General;  Laterality: Right;  . BREAST SURGERY    . CHOLECYSTECTOMY    . KNEE SURGERY    . LUMBAR LAMINECTOMY    . TOTAL KNEE ARTHROPLASTY Right 01/04/2018  . TOTAL KNEE ARTHROPLASTY Right 01/04/2018   Procedure: TOTAL KNEE ARTHROPLASTY;  Surgeon: Vickey Huger, MD;  Location: [facility];  Service: Orthopedics;  Laterality: Right;  Marland Kitchen VAGINAL PROLAPSE REPAIR     2015   DONE IN [place]  . VAGINAL PROLAPSE REPAIR  05/20/2018     OB History   No obstetric history on file.     Family History  Problem Relation Age of Onset  . Heart disease Mother   . Breast cancer Mother 1       deceased 58; TAH/BSO (age?)  . Heart disease Father   . Breast cancer Daughter        dx 10s; currenlty 61; Neg BRCA1/BRCA2  . Breast cancer Maternal Aunt 44       deceased 48    Social History   Tobacco Use  . Smoking status: Never Smoker  . Smokeless tobacco: Never Used  Vaping Use  .  Vaping Use: Never used  Substance Use Topics  . Alcohol use: No  . Drug use: No    Home Medications Prior to Admission medications   Medication Sig Start Date End Date Taking? Authorizing Provider  aspirin EC 81 MG tablet Take 81 mg by mouth daily.    [provider]  carvedilol (COREG) 12.5 MG tablet Take 12.5 mg by mouth 2 (two) times daily with a meal.      [provider]  fluticasone (FLONASE) 50 MCG/ACT nasal spray Place 1 spray into both nostrils daily as needed for allergies or rhinitis.    [provider]  gabapentin (NEURONTIN) 300 MG capsule Take 1 capsule (300 mg total) by mouth at bedtime. 03/13/20   Magrinat, Valentino Hue, MD  levothyroxine (SYNTHROID, LEVOTHROID) 75 MCG tablet Take 75 mcg by mouth daily before breakfast.     [provider]  Multiple Vitamin (MULTIVITAMIN PO) Take 1 tablet by mouth  daily.      [provider]  simvastatin (ZOCOR) 20 MG tablet Take 20 mg by mouth at bedtime.      [provider]  tamoxifen (NOLVADEX) 20 MG tablet Take 1 tablet (20 mg total) by mouth daily. 03/13/20   Magrinat, Valentino Hue, MD  telmisartan (MICARDIS) 20 MG tablet Take 20 mg by mouth daily.    [provider]  venlafaxine (EFFEXOR) 75 MG tablet Take 1 tablet (75 mg total) by mouth 2 (two) times daily. 03/13/20   Magrinat, Valentino Hue, MD    Allergies    Penicillins  Review of Systems   Review of Systems  Neurological: Positive for speech difficulty and weakness.  All other systems reviewed and are negative.   Physical Exam Updated Vital Signs BP (!) 175/78   Pulse 80   Temp 98 F (36.7 C) (Oral)   Resp 19   Ht 5\' 6"  (1.676 m)   Wt 78.8 kg   SpO2 100%   BMI 28.04 kg/m   Physical Exam Vitals and nursing note reviewed.  Constitutional:      Comments: Chronically ill-appearing.  HENT:     Head: Normocephalic.     Nose: Nose normal.     Mouth/Throat:     Mouth: Mucous membranes are moist.  Eyes:     Extraocular Movements: Extraocular movements intact.     Pupils: Pupils are equal, round, and reactive to light.  Cardiovascular:     Rate and Rhythm: Normal rate and regular rhythm.     Pulses: Normal pulses.     Heart sounds: Normal heart sounds.  Pulmonary:     Effort: Pulmonary effort is normal.     Breath sounds: Normal breath sounds.  Abdominal:     General: Abdomen is flat.     Palpations: Abdomen is soft.  Musculoskeletal:        General: Normal range of motion.     Cervical back: Normal range of motion and neck supple.  Skin:    General: Skin is warm.     Capillary Refill: Capillary refill takes less than 2 seconds.  Neurological:     Comments: Some slurred speech.  Some right facial droop as well.  Patient strength is 5 out of 5 bilateral arms and legs normal sensation throughout.   Psychiatric:        Mood and Affect: Mood normal.      ED Results / Procedures / Treatments   Labs (all labs ordered are listed, but only abnormal results are displayed)  Labs Reviewed  CBC - Abnormal; Notable for the following components:      Result Value   RBC 3.35 (*)    Hemoglobin 10.3 (*)    HCT 32.5 (*)    All other components within normal limits  COMPREHENSIVE METABOLIC PANEL - Abnormal; Notable for the following components:   Glucose, Bld 141 (*)    Total Protein 6.1 (*)    Albumin 3.4 (*)    All other components within normal limits  I-STAT CHEM 8, ED - Abnormal; Notable for the following components:   Glucose, Bld 130 (*)    Calcium, Ion 1.10 (*)    Hemoglobin 11.2 (*)    HCT 33.0 (*)    All other components within normal limits  CBG MONITORING, ED - Abnormal; Notable for the following components:   Glucose-Capillary 123 (*)    All other components within normal limits  SARS CORONAVIRUS 2 BY RT PCR [facility] ORDER, PERFORMED IN [facility])  PROTIME-INR  APTT  DIFFERENTIAL    EKG EKG Interpretation  Date/Time:  Sunday January 06 2021 17:18:53 EST Ventricular Rate:  86 PR Interval:    QRS Duration: 100 QT Interval:  395 QTC Calculation: 473 R Axis:   62 Text Interpretation: Sinus rhythm Borderline prolonged PR interval No significant change since last tracing Confirmed by Richardean Canal [phone]) on 01/06/2021 5:22:34 PM   Radiology CT HEAD CODE STROKE WO CONTRAST  Result Date: 01/06/2021 CLINICAL DATA:  Code stroke. Neuro deficit, acute stroke suspected. Slurred speech with right-sided facial droop. EXAM: CT HEAD WITHOUT CONTRAST TECHNIQUE: Contiguous axial images were obtained from the base of the skull through the vertex without intravenous contrast. COMPARISON:  None. FINDINGS: Brain: No evidence of acute large vascular territory infarction, hemorrhage, hydrocephalus, extra-axial collection or mass lesion/mass effect. Mild patchy white matter hypoattenuation, most likely related to chronic  microvascular ischemic disease. Vascular: No hyperdense vessel identified. Calcific atherosclerosis. Skull: No acute fracture. Sinuses/Orbits: No acute findings. Other: No mastoid effusions. ASPECTS [facility] Stroke Program Early CT Score) total score (0-10 with 10 being normal): 10. IMPRESSION: 1. No evidence of acute intracranial abnormality. 2. ASPECTS is 10. Code stroke imaging results were communicated on 01/06/2021 at 5:00 pm to provider Dr. Thomasena Edis via telephone, who verbally acknowledged these results. Electronically Signed   By: Feliberto Harts MD   On: 01/06/2021 17:03    Procedures Procedures   Medications Ordered in ED Medications  sodium chloride flush (NS) 0.9 % injection 3 mL (has no administration in time range)  aspirin chewable tablet 324 mg (has no administration in time range)  iohexol (OMNIPAQUE) 350 MG/ML injection 75 mL (60 mLs Intravenous Contrast Given 01/06/21 1709)    ED Course  I have reviewed the triage vital signs and the nursing notes.  Pertinent labs & imaging results that were available during my care of the patient were reviewed by me and considered in my medical decision making (see chart for details).    MDM Rules/Calculators/A&P                         HARLEIGH CIVELLO is a 80 y.o. female here presenting with slurred speech and right facial droop.  Code stroke activated by EMS.  Dr. Thomasena Edis from neurology is at bedside.  Will get CTA head and neck and stroke labs  5:53 PM Patient NIH is 3.  Decision is not to give TPA.  Her facial droop and slurred speech  has resolved.  Neurology recommend admission with work-up for stroke vs TIA    Final Clinical Impression(s) / ED Diagnoses Final diagnoses:  None    Rx / DC Orders ED Discharge Orders    None       Drenda Freeze, MD 01/06/21 1904

## 2021-01-06 NOTE — H&P (Signed)
History and Physical    SYDNY Trevino UKG:[id number] DOB: 1941-07-01 DOA: 01/06/2021  PCP: Cari Caraway, MD  Patient coming from: Home via EMS  I have personally briefly reviewed patient's old medical records in [place]  Chief Complaint: Abnormal speech, right facial droop  HPI: Crystal Trevino is a 80 y.o. female with medical history significant for hypertension, hyperlipidemia, hypothyroidism, right breast cancer s/p lumpectomy and adjuvant radiation currently on tamoxifen who presents to the ED for evaluation of dysarthria and right facial droop.  Patient was in her usual state of health until around 1600 earlier today when she was on the phone with her friend who noticed her speech was slurred.  Patient was noted to have drooping of her right mouth at home as well.  EMS were called and per ED triage notes on their arrival symptoms had resolved.  While on route to the ED symptoms recurred at 1635 with EMS.  Patient arrived as a code stroke and seen by neurology on arrival.  She was not considered a TPA candidate.  Patient denies any prior history of stroke.  She did not have any associated subjective fevers, chills, diaphoresis, headache, change in vision, nausea, vomiting, chest pain, palpitations, dyspnea, abdominal pain, dysuria, change in sensation, or focal weakness. She does take aspirin 81 mg daily.  ED Course:  Initial vitals showed BP 135/78, pulse 85, RR 17, temp 98.0 F, SPO2 100% on room air.  Labs show WBC 5.0, hemoglobin 10.3, platelets 190,000, sodium 137, potassium 4.0, bicarb 23, BUN 19, creatinine 0.8, serum glucose 141, LFT within normal limits.  SARS-CoV-2 PCR is negative.  CT head without contrast was negative for evidence of acute intracranial abnormality.  CTA head/neck are negative for evidence of large vessel occlusion or proximal hemodynamically significant stenosis in the head or neck.  Neurology were consulted and recommended further TIA or stroke  work-up.  Patient was given aspirin 324 mg once and the hospitalist service was consulted to admit for further evaluation and management.  Review of Systems: All systems reviewed and are negative except as documented in history of present illness above.   Past Medical History:  Diagnosis Date  . Arthritis   . Breast CA ([place])    right breast cancer  . Cancer ([place])    HAD RIGHT BREAST BX --GOING TO [facility] ON WED 12/30/2017  . Constipation 08/13/2011  . Dizziness 08/13/2011  . Family history of adverse reaction to anesthesia    pt mother had PONV  . Family history of breast cancer   . Genetic testing 01/28/2018   Multi-Cancer panel (83 genes) @ [facility] - No pathogenic mutations detected  . GERD (gastroesophageal reflux disease)   . Heart murmur    "SLIGHT HEART MURMUR"  WAS PICKED UP AS AN ADULT  . Hyperlipidemia   . Hypertension   . Hypothyroidism   . Osteoarthritis of knee    Right  . Peripheral vascular disease ([place])   . Reflux   . Splenic artery aneurysm ([facility])    stable 1.5cm by 08/2013 CT; no further surveillance recommended  . Weight loss     Past Surgical History:  Procedure Laterality Date  . ABDOMINAL HYSTERECTOMY    . BACK SURGERY    . BREAST LUMPECTOMY WITH RADIOACTIVE SEED LOCALIZATION Right 02/19/2018   Procedure: BREAST LUMPECTOMY WITH RADIOACTIVE SEED LOCALIZATION. RIGHT;  Surgeon: Alphonsa Overall, MD;  Location: [facility];  Service: General;  Laterality: Right;  . BREAST SURGERY    .  CHOLECYSTECTOMY    . KNEE SURGERY    . LUMBAR LAMINECTOMY    . TOTAL KNEE ARTHROPLASTY Right 01/04/2018  . TOTAL KNEE ARTHROPLASTY Right 01/04/2018   Procedure: TOTAL KNEE ARTHROPLASTY;  Surgeon: Vickey Huger, MD;  Location: [place] [facility];  Service: Orthopedics;  Laterality: Right;  Marland Kitchen VAGINAL PROLAPSE REPAIR     2015   DONE IN [place]  . VAGINAL PROLAPSE REPAIR  05/20/2018    Social History:  reports that she has never smoked. She has never used smokeless tobacco. She reports that she  does not drink alcohol and does not use drugs.  Allergies  Allergen Reactions  . Penicillins Nausea And Vomiting, Rash and Other (See Comments)    PATIENT HAS HAD A PCN REACTION WITH IMMEDIATE RASH, FACIAL/TONGUE/THROAT SWELLING, SOB, OR LIGHTHEADEDNESS WITH HYPOTENSION:  #  #  #  YES  #  #  #   Has patient had a PCN reaction causing severe rash involving mucus membranes or skin necrosis: No Has patient had a PCN reaction that required hospitalization: No Has patient had a PCN reaction occurring within the last 10 years: No     Family History  Problem Relation Age of Onset  . Heart disease Mother   . Breast cancer Mother 42       deceased 62; TAH/BSO (age?)  . Heart disease Father   . Breast cancer Daughter        dx 77s; currenlty 86; Neg BRCA1/BRCA2  . Breast cancer Maternal Aunt 44       deceased 63     Prior to Admission medications   Medication Sig Start Date End Date Taking? Authorizing Provider  aspirin EC 81 MG tablet Take 81 mg by mouth daily.    [provider]  carvedilol (COREG) 12.5 MG tablet Take 12.5 mg by mouth 2 (two) times daily with a meal.      [provider]  fluticasone (FLONASE) 50 MCG/ACT nasal spray Place 1 spray into both nostrils daily as needed for allergies or rhinitis.    [provider]  gabapentin (NEURONTIN) 300 MG capsule Take 1 capsule (300 mg total) by mouth at bedtime. 03/13/20   Magrinat, Virgie Dad, MD  levothyroxine (SYNTHROID, LEVOTHROID) 75 MCG tablet Take 75 mcg by mouth daily before breakfast.     [provider]  Multiple Vitamin (MULTIVITAMIN PO) Take 1 tablet by mouth daily.      [provider]  simvastatin (ZOCOR) 20 MG tablet Take 20 mg by mouth at bedtime.      [provider]  tamoxifen (NOLVADEX) 20 MG tablet Take 1 tablet (20 mg total) by mouth daily. 03/13/20   Magrinat, Virgie Dad, MD  telmisartan (MICARDIS) 20 MG tablet Take 20 mg by mouth daily.    [provider]   venlafaxine (EFFEXOR) 75 MG tablet Take 1 tablet (75 mg total) by mouth 2 (two) times daily. 03/13/20   Magrinat, Virgie Dad, MD    Physical Exam: Vitals:   01/06/21 1600 01/06/21 1719 01/06/21 1734 01/06/21 2015  BP:  (!) 175/78  (!) 149/71  Pulse:  84 80 74  Resp:  $Remo'17 19 19  'cuqUP$ Temp:   98 F (36.7 C)   TempSrc:   Oral   SpO2:  100% 100% 100%  Weight: 74.8 kg  78.8 kg   Height:   '5\' 6"'$  (1.676 m)    Constitutional: Resting supine in bed, NAD, calm, comfortable Eyes: PERRL, EOMI, lids and conjunctivae normal ENMT: Mucous  membranes are moist. Posterior pharynx clear of any exudate or lesions.Normal dentition.  Neck: normal, supple, no masses. Respiratory: clear to auscultation bilaterally, no wheezing, no crackles. Normal respiratory effort. No accessory muscle use.  Cardiovascular: Regular rate and rhythm, no murmurs / rubs / gallops. No extremity edema. 2+ pedal pulses. Abdomen: no tenderness, no masses palpated. No hepatosplenomegaly. Bowel sounds positive.  Musculoskeletal: no clubbing / cyanosis. No joint deformity upper and lower extremities. Good ROM, no contractures. Normal muscle tone.  Skin: no rashes, lesions, ulcers. No induration Neurologic: Right facial droop at the mouth otherwise CN 2-12 grossly intact. Sensation intact. Strength 5/5 in all 4.  Psychiatric: Alert and oriented x 3. Normal mood.   Labs on Admission: I have personally reviewed following labs and imaging studies  CBC: Recent Labs  Lab 01/06/21 1652 01/06/21 1654  WBC 5.0  --   NEUTROABS 3.0  --   HGB 10.3* 11.2*  HCT 32.5* 33.0*  MCV 97.0  --   PLT 190  --    Basic Metabolic Panel: Recent Labs  Lab 01/06/21 1652 01/06/21 1654  NA 137 137  K 4.0 4.0  CL 103 103  CO2 23  --   GLUCOSE 141* 130*  BUN 19 22  CREATININE 0.88 0.70  CALCIUM 9.0  --    GFR: Estimated Creatinine Clearance: 61.4 mL/min (by C-G formula based on SCr of 0.7 mg/dL). Liver Function Tests: Recent Labs  Lab  01/06/21 1652  AST 18  ALT 11  ALKPHOS 52  BILITOT 0.6  PROT 6.1*  ALBUMIN 3.4*   No results for input(s): LIPASE, AMYLASE in the last 168 hours. No results for input(s): AMMONIA in the last 168 hours. Coagulation Profile: Recent Labs  Lab 01/06/21 1652  INR 0.9   Cardiac Enzymes: No results for input(s): CKTOTAL, CKMB, CKMBINDEX, TROPONINI in the last 168 hours. BNP (last 3 results) No results for input(s): PROBNP in the last 8760 hours. HbA1C: No results for input(s): HGBA1C in the last 72 hours. CBG: Recent Labs  Lab 01/06/21 1647  GLUCAP 123*   Lipid Profile: No results for input(s): CHOL, HDL, LDLCALC, TRIG, CHOLHDL, LDLDIRECT in the last 72 hours. Thyroid Function Tests: No results for input(s): TSH, T4TOTAL, FREET4, T3FREE, THYROIDAB in the last 72 hours. Anemia Panel: No results for input(s): VITAMINB12, FOLATE, FERRITIN, TIBC, IRON, RETICCTPCT in the last 72 hours. Urine analysis: No results found for: COLORURINE, APPEARANCEUR, LABSPEC, [place], GLUCOSEU, HGBUR, BILIRUBINUR, KETONESUR, PROTEINUR, UROBILINOGEN, NITRITE, LEUKOCYTESUR  Radiological Exams on Admission: CT HEAD CODE STROKE WO CONTRAST  Result Date: 01/06/2021 CLINICAL DATA:  Code stroke. Neuro deficit, acute stroke suspected. Slurred speech with right-sided facial droop. EXAM: CT HEAD WITHOUT CONTRAST TECHNIQUE: Contiguous axial images were obtained from the base of the skull through the vertex without intravenous contrast. COMPARISON:  None. FINDINGS: Brain: No evidence of acute large vascular territory infarction, hemorrhage, hydrocephalus, extra-axial collection or mass lesion/mass effect. Mild patchy white matter hypoattenuation, most likely related to chronic microvascular ischemic disease. Vascular: No hyperdense vessel identified. Calcific atherosclerosis. Skull: No acute fracture. Sinuses/Orbits: No acute findings. Other: No mastoid effusions. ASPECTS [facility] Stroke Program Early CT Score) total  score (0-10 with 10 being normal): 10. IMPRESSION: 1. No evidence of acute intracranial abnormality. 2. ASPECTS is 10. Code stroke imaging results were communicated on 01/06/2021 at 5:00 pm to provider Dr. Theda Sers via telephone, who verbally acknowledged these results. Electronically Signed   By: Margaretha Sheffield MD   On: 01/06/2021 17:03   CT  ANGIO HEAD CODE STROKE  Result Date: 01/06/2021 CLINICAL DATA:  Stroke/TIA, assess extracranial arteries. EXAM: CT HEAD WITHOUT CONTRAST CT ANGIOGRAPHY OF THE HEAD AND NECK TECHNIQUE: Contiguous axial images were obtained from the base of the skull through the vertex without intravenous contrast. Multidetector CT imaging of the head and neck was performed using the standard protocol during bolus administration of intravenous contrast. Multiplanar CT image reconstructions and MIPs were obtained to evaluate the vascular anatomy. Carotid stenosis measurements (when applicable) are obtained utilizing NASCET criteria, using the distal internal carotid diameter as the denominator. CONTRAST:  64mL OMNIPAQUE IOHEXOL 350 MG/ML SOLN COMPARISON:  Same day head CT FINDINGS: CTA NECK Aortic arch: Great vessel origins are patent. Right carotid system: No evidence of hemodynamically significant stenosis or dissection. Left carotid system: No evidence of hemodynamically significant stenosis or dissection. Vertebral arteries:Codominant. No evidence of significant stenosis or dissection. Skeleton: Degenerative changes of the cervical spine, greatest at C5-C6 where there is disc height loss, endplate sclerosis and posterior disc osteophyte complex. There is osseous fusion across the C7-T1 vertebral bodies, possibly congenital. Other neck: No evidence of mass or suspicious adenopathy. CTA HEAD Anterior circulation: No evidence of large vessel occlusion, proximal hemodynamically significant stenosis or aneurysm. Mild calcific atherosclerosis of bilateral cavernous carotid arteries. Posterior  circulation: No evidence of large vessel occlusion, proximal hemodynamically significant stenosis or aneurysm. Mild stenosis of the intradural left vertebral artery. wMildly dysplastic basilar tip without discrete aneurysm. Venous sinuses: Within the limitation of arterial timing, no evidence of dural sinus thrombosis. Small left transverse sinus. IMPRESSION: No evidence of large vessel occlusion or proximal hemodynamically significant stenosis in the head or neck. Findings were discussed with Dr. Thomasena Edis via telephone on 01/06/2021 at 5:00 p.m. Electronically Signed   By: Feliberto Harts MD   On: 01/06/2021 18:06   CT ANGIO NECK CODE STROKE  Result Date: 01/06/2021 CLINICAL DATA:  Stroke/TIA, assess extracranial arteries. EXAM: CT HEAD WITHOUT CONTRAST CT ANGIOGRAPHY OF THE HEAD AND NECK TECHNIQUE: Contiguous axial images were obtained from the base of the skull through the vertex without intravenous contrast. Multidetector CT imaging of the head and neck was performed using the standard protocol during bolus administration of intravenous contrast. Multiplanar CT image reconstructions and MIPs were obtained to evaluate the vascular anatomy. Carotid stenosis measurements (when applicable) are obtained utilizing NASCET criteria, using the distal internal carotid diameter as the denominator. CONTRAST:  13mL OMNIPAQUE IOHEXOL 350 MG/ML SOLN COMPARISON:  Same day head CT FINDINGS: CTA NECK Aortic arch: Great vessel origins are patent. Right carotid system: No evidence of hemodynamically significant stenosis or dissection. Left carotid system: No evidence of hemodynamically significant stenosis or dissection. Vertebral arteries:Codominant. No evidence of significant stenosis or dissection. Skeleton: Degenerative changes of the cervical spine, greatest at C5-C6 where there is disc height loss, endplate sclerosis and posterior disc osteophyte complex. There is osseous fusion across the C7-T1 vertebral bodies, possibly  congenital. Other neck: No evidence of mass or suspicious adenopathy. CTA HEAD Anterior circulation: No evidence of large vessel occlusion, proximal hemodynamically significant stenosis or aneurysm. Mild calcific atherosclerosis of bilateral cavernous carotid arteries. Posterior circulation: No evidence of large vessel occlusion, proximal hemodynamically significant stenosis or aneurysm. Mild stenosis of the intradural left vertebral artery. wMildly dysplastic basilar tip without discrete aneurysm. Venous sinuses: Within the limitation of arterial timing, no evidence of dural sinus thrombosis. Small left transverse sinus. IMPRESSION: No evidence of large vessel occlusion or proximal hemodynamically significant stenosis in the head or neck. Findings were  discussed with Dr. Theda Sers via telephone on 01/06/2021 at 5:00 p.m. Electronically Signed   By: Margaretha Sheffield MD   On: 01/06/2021 18:06    EKG: Personally reviewed. Normal sinus rhythm, borderline prolonged PR interval.  PR interval is more prolonged when compared to prior.  Assessment/Plan Principal Problem:   TIA (transient ischemic attack) Active Problems:   Essential hypertension   Hyperlipidemia   Hypothyroidism  Crystal Trevino is a 80 y.o. female with medical history significant for hypertension, hyperlipidemia, hypothyroidism, right breast cancer s/p lumpectomy and adjuvant radiation currently on tamoxifen who is admitted for TIA.  Dysarthria/right facial droop due to suspected TIA versus stroke: CT head and CTA head/neck reassuring.  Seen by neurology who recommended further TIA for stroke work-up and started on dual antiplatelet therapy.  Received aspirin 325 mg in the ED. -MRI brain without contrast -Echocardiogram -Continue aspirin 81 mg daily beginning tomorrow -Start Plavix 75 mg daily x21 days -Monitor telemetry, continue neurochecks -Allow permissive hypertension for now -Check A1c, lipid panel -PT/OT/SLP  eval  Hypertension: Holding home Coreg and telmisartan to allow permissive hypertension this time.  Hyperlipidemia: Continue home simvastatin, consider change to high intensity atorvastatin or rosuvastatin prior to discharge.  Hypothyroidism: Continue levothyroxine, check TSH.  Right breast cancer: S/p lumpectomy and adjuvant radiation.  Has been on tamoxifen for almost 3 years out of planned 5-year course.  Will hold tamoxifen at this time due to potential association with thromboembolic stroke.  DVT prophylaxis: Lovenox Code Status: DNR, confirmed with patient Family Communication: Discussed with patient's daughter at bedside Disposition Plan: From home and likely discharge to home pending further CVA work-up and PT/OT/SLP eval Consults called: Neurology Level of care: Telemetry Medical Admission status:  Status is: Observation  The patient remains OBS appropriate and will d/c before 2 midnights.  Dispo: The patient is from: Home              Anticipated d/c is to: Home              Anticipated d/c date is: 1 day              Patient currently is not medically stable to d/c.   Difficult to place patient No  Zada Finders MD [facility]  If 7PM-7AM, please contact night-coverage [email or web address]  01/06/2021, 8:39 PM

## 2021-01-07 ENCOUNTER — Observation Stay (HOSPITAL_COMMUNITY): Payer: Medicare Other

## 2021-01-07 DIAGNOSIS — Z79810 Long term (current) use of selective estrogen receptor modulators (SERMs): Secondary | ICD-10-CM | POA: Diagnosis not present

## 2021-01-07 DIAGNOSIS — I739 Peripheral vascular disease, unspecified: Secondary | ICD-10-CM | POA: Diagnosis present

## 2021-01-07 DIAGNOSIS — I351 Nonrheumatic aortic (valve) insufficiency: Secondary | ICD-10-CM

## 2021-01-07 DIAGNOSIS — K219 Gastro-esophageal reflux disease without esophagitis: Secondary | ICD-10-CM | POA: Diagnosis present

## 2021-01-07 DIAGNOSIS — I639 Cerebral infarction, unspecified: Secondary | ICD-10-CM | POA: Diagnosis not present

## 2021-01-07 DIAGNOSIS — R4781 Slurred speech: Secondary | ICD-10-CM | POA: Diagnosis not present

## 2021-01-07 DIAGNOSIS — D0511 Intraductal carcinoma in situ of right breast: Secondary | ICD-10-CM | POA: Diagnosis not present

## 2021-01-07 DIAGNOSIS — R7303 Prediabetes: Secondary | ICD-10-CM

## 2021-01-07 DIAGNOSIS — E039 Hypothyroidism, unspecified: Secondary | ICD-10-CM

## 2021-01-07 DIAGNOSIS — I340 Nonrheumatic mitral (valve) insufficiency: Secondary | ICD-10-CM | POA: Diagnosis not present

## 2021-01-07 DIAGNOSIS — D649 Anemia, unspecified: Secondary | ICD-10-CM | POA: Diagnosis present

## 2021-01-07 DIAGNOSIS — Z79899 Other long term (current) drug therapy: Secondary | ICD-10-CM | POA: Diagnosis not present

## 2021-01-07 DIAGNOSIS — Z923 Personal history of irradiation: Secondary | ICD-10-CM | POA: Diagnosis not present

## 2021-01-07 DIAGNOSIS — Z20822 Contact with and (suspected) exposure to covid-19: Secondary | ICD-10-CM | POA: Diagnosis present

## 2021-01-07 DIAGNOSIS — I63412 Cerebral infarction due to embolism of left middle cerebral artery: Secondary | ICD-10-CM | POA: Diagnosis present

## 2021-01-07 DIAGNOSIS — E785 Hyperlipidemia, unspecified: Secondary | ICD-10-CM | POA: Diagnosis present

## 2021-01-07 DIAGNOSIS — G459 Transient cerebral ischemic attack, unspecified: Secondary | ICD-10-CM

## 2021-01-07 DIAGNOSIS — Z803 Family history of malignant neoplasm of breast: Secondary | ICD-10-CM | POA: Diagnosis not present

## 2021-01-07 DIAGNOSIS — Z8249 Family history of ischemic heart disease and other diseases of the circulatory system: Secondary | ICD-10-CM | POA: Diagnosis not present

## 2021-01-07 DIAGNOSIS — Z90710 Acquired absence of both cervix and uterus: Secondary | ICD-10-CM | POA: Diagnosis not present

## 2021-01-07 DIAGNOSIS — Z66 Do not resuscitate: Secondary | ICD-10-CM | POA: Diagnosis present

## 2021-01-07 DIAGNOSIS — R4701 Aphasia: Secondary | ICD-10-CM | POA: Diagnosis present

## 2021-01-07 DIAGNOSIS — Z880 Allergy status to penicillin: Secondary | ICD-10-CM | POA: Diagnosis not present

## 2021-01-07 DIAGNOSIS — Z7982 Long term (current) use of aspirin: Secondary | ICD-10-CM | POA: Diagnosis not present

## 2021-01-07 DIAGNOSIS — R471 Dysarthria and anarthria: Secondary | ICD-10-CM | POA: Diagnosis present

## 2021-01-07 DIAGNOSIS — Z96651 Presence of right artificial knee joint: Secondary | ICD-10-CM | POA: Diagnosis present

## 2021-01-07 DIAGNOSIS — Z9049 Acquired absence of other specified parts of digestive tract: Secondary | ICD-10-CM | POA: Diagnosis not present

## 2021-01-07 DIAGNOSIS — G3184 Mild cognitive impairment, so stated: Secondary | ICD-10-CM | POA: Diagnosis present

## 2021-01-07 DIAGNOSIS — I10 Essential (primary) hypertension: Secondary | ICD-10-CM | POA: Diagnosis present

## 2021-01-07 DIAGNOSIS — R29810 Facial weakness: Secondary | ICD-10-CM | POA: Diagnosis present

## 2021-01-07 LAB — ECHOCARDIOGRAM COMPLETE
Area-P 1/2: 3.77 cm2
Calc EF: 64.4 %
Height: 66 in
MV VTI: 1.14 cm2
P 1/2 time: 382 msec
S' Lateral: 2.5 cm
Single Plane A2C EF: 60.9 %
Single Plane A4C EF: 60.7 %
Weight: 2779.56 oz

## 2021-01-07 LAB — BASIC METABOLIC PANEL
Anion gap: 10 (ref 5–15)
BUN: 13 mg/dL (ref 8–23)
CO2: 24 mmol/L (ref 22–32)
Calcium: 8.8 mg/dL — ABNORMAL LOW (ref 8.9–10.3)
Chloride: 105 mmol/L (ref 98–111)
Creatinine, Ser: 0.83 mg/dL (ref 0.44–1.00)
GFR, Estimated: >60 mL/min (ref >60)
Glucose, Bld: 125 mg/dL — ABNORMAL HIGH (ref 70–99)
Potassium: 4.4 mmol/L (ref 3.5–5.1)
Sodium: 139 mmol/L (ref 135–145)

## 2021-01-07 LAB — CBC
HCT: 32.3 % — ABNORMAL LOW (ref 36.0–46.0)
Hemoglobin: 10.2 g/dL — ABNORMAL LOW (ref 12.0–15.0)
MCH: 30.2 pg (ref 26.0–34.0)
MCHC: 31.6 g/dL (ref 30.0–36.0)
MCV: 95.6 fL (ref 80.0–100.0)
Platelets: 195 10*3/uL (ref 150–400)
RBC: 3.38 MIL/uL — ABNORMAL LOW (ref 3.87–5.11)
RDW: 14.2 % (ref 11.5–15.5)
WBC: 6.1 10*3/uL (ref 4.0–10.5)
nRBC: 0 % (ref 0.0–0.2)

## 2021-01-07 LAB — LIPID PANEL
Cholesterol: 154 mg/dL (ref 0–200)
HDL: 58 mg/dL (ref >40)
LDL Cholesterol: 68 mg/dL (ref 0–99)
Total CHOL/HDL Ratio: 2.7 RATIO
Triglycerides: 139 mg/dL (ref <150)
VLDL: 28 mg/dL (ref 0–40)

## 2021-01-07 LAB — HEMOGLOBIN A1C
Hgb A1c MFr Bld: 6 % — ABNORMAL HIGH (ref 4.8–5.6)
Mean Plasma Glucose: 125.5 mg/dL

## 2021-01-07 LAB — TSH: TSH: 3.265 u[IU]/mL (ref 0.350–4.500)

## 2021-01-07 MED ORDER — MORPHINE SULFATE (PF) 2 MG/ML IV SOLN
2.0000 mg | Freq: Once | INTRAVENOUS | Status: AC
  Administered 2021-01-07: 2 mg via INTRAVENOUS
  Filled 2021-01-07: qty 1

## 2021-01-07 MED ORDER — PROCHLORPERAZINE 25 MG RE SUPP
25.0000 mg | Freq: Two times a day (BID) | RECTAL | Status: DC | PRN
  Administered 2021-01-07 – 2021-01-09 (×3): 25 mg via RECTAL
  Filled 2021-01-07 (×5): qty 1

## 2021-01-07 MED ORDER — HYDROCODONE-ACETAMINOPHEN 5-325 MG PO TABS
1.0000 | ORAL_TABLET | ORAL | Status: DC | PRN
  Administered 2021-01-07 – 2021-01-09 (×3): 2 via ORAL
  Filled 2021-01-07: qty 2
  Filled 2021-01-07: qty 1
  Filled 2021-01-07 (×2): qty 2
  Filled 2021-01-07: qty 1

## 2021-01-07 MED ORDER — ONDANSETRON HCL 4 MG/2ML IJ SOLN
4.0000 mg | Freq: Four times a day (QID) | INTRAMUSCULAR | Status: DC | PRN
  Administered 2021-01-07: 4 mg via INTRAVENOUS
  Filled 2021-01-07 (×2): qty 2

## 2021-01-07 MED ORDER — ONDANSETRON HCL 4 MG/2ML IJ SOLN
4.0000 mg | Freq: Once | INTRAMUSCULAR | Status: AC
  Administered 2021-01-07: 4 mg via INTRAVENOUS
  Filled 2021-01-07: qty 2

## 2021-01-07 NOTE — ED Notes (Signed)
Pt continues to c/o nausea and headache.  Given pain medication, but unable to give nausea medication d/t time.

## 2021-01-07 NOTE — Evaluation (Signed)
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 PMHx:   Clinical Impression   Pt PTA: Pt shares her home with a friend and reports independence prior. Pt limited by decreased strength, decreased ability to care for self, decreased cognition and decreased visual attention and needs to be further assessed. Pt's vision with impairments: Restricted in R eye with visual scanning task to look lateral to look up. Pt keeping both eyes closed for majority of session.  Pt minA to [place] for ADL and minA+2 for mobility. Pt currently with sequencing deficits. Pt would benefit from continued OT skilled services for ADL, mobility and safety in CIR setting. OT following acutely.    Follow Up Recommendations  CIR    Equipment Recommendations  3 in 1 bedside commode    Recommendations for Other Services Rehab consult     Precautions / Restrictions Precautions Precautions: Fall Restrictions Weight Bearing Restrictions: No      Mobility Bed Mobility Overal bed mobility: Needs Assistance Bed Mobility: Supine to Sit;Sit to Supine     Supine to sit: Min assist Sit to supine: Min assist   General bed mobility comments: Assist for trunk and guidance of LB    Transfers Overall transfer level: Needs assistance Equipment used: 2 person hand held assist Transfers: Sit to/from Stand Sit to Stand: Min assist;+2 physical assistance;+2 safety/equipment Stand pivot transfers: Min assist       General transfer comment: minA +57f or stability and to assist with command follow    Balance Overall balance assessment: Needs assistance Sitting-balance support: Bilateral upper extremity supported;Feet supported Sitting balance-Leahy Scale: Poor     Standing  balance support: Bilateral upper extremity supported Standing balance-Leahy Scale: Poor Standing balance comment: assist to stabilize pt                           ADL either performed or assessed with clinical judgement   ADL Overall ADL's : Needs assistance/impaired Eating/Feeding: Modified independent;Sitting   Grooming: Min guard;Standing   Upper Body Bathing: Minimal assistance;Sitting;Standing   Lower Body Bathing: Moderate assistance;Sitting/lateral leans;Sit to/from stand   Upper Body Dressing : Minimal assistance;Sitting   Lower Body Dressing: Moderate assistance;Sit to/from stand;Cueing for safety;Cueing for sequencing   Toilet Transfer: Minimal assistance;+2 for physical assistance;+2 for safety/equipment;Ambulation;BSC   Toileting- Clothing Manipulation and Hygiene: Moderate assistance;Sitting/lateral lean;Cueing for safety;Cueing for sequencing       Functional mobility during ADLs: Minimal assistance;+2 for physical assistance;+2 for safety/equipment;Cueing for safety;Cueing for sequencing General ADL Comments: Pt limited by decreased strength, decreased ability to care for self, decreased cognition and decreased visual attention and needs to be further assessed.     Vision Baseline Vision/History: Wears glasses Wears Glasses: At all times Patient Visual Report: Other (comment) (R eye closed for majority of session; pt keeping both eyes closed for majority of session) Vision Assessment?: Yes;Vision impaired- to be further tested in functional context Eye Alignment: Impaired (comment) Ocular Range of Motion: Restricted on the left;Impaired-to be further tested in functional context;Other (comment);Restricted looking up (Restricted in R eye with visual scanning task to look lateral to R and up.) Alignment/Gaze Preference: Gaze left Tracking/Visual Pursuits: Decreased smoothness of eye movement to RIGHT superior field;Right eye does not  track  laterally;Impaired - to be further tested in functional context Additional Comments: Pt with difficulty following commands and easily distracted during visual testing.     Perception     Praxis      Pertinent Vitals/Pain Pain Assessment: 0-10 Pain Score: 6  Pain Location: headache Pain Descriptors / Indicators: Headache Pain Intervention(s): Limited activity within patient's tolerance;Monitored during session;Repositioned     Hand Dominance Right   Extremity/Trunk Assessment Upper Extremity Assessment Upper Extremity Assessment: Generalized weakness;RUE deficits/detail;LUE deficits/detail RUE Deficits / Details: 3+/5 MM grade LUE Deficits / Details: 3+/5 MM grade   Lower Extremity Assessment Lower Extremity Assessment: Generalized weakness;Defer to PT evaluation       Communication Communication Communication: Expressive difficulties   Cognition Arousal/Alertness: Awake/alert Behavior During Therapy: WFL for tasks assessed/performed Overall Cognitive Status: Impaired/Different from baseline Area of Impairment: Orientation;Attention;Memory;Safety/judgement;Awareness;Problem solving;Following commands                 Orientation Level: Disoriented to;Situation;Time Current Attention Level: Selective Memory: Decreased short-term memory Following Commands: Follows one step commands inconsistently;Follows one step commands with increased time Safety/Judgement: Decreased awareness of safety;Decreased awareness of deficits Awareness: Emergent Problem Solving: Decreased initiation;Difficulty sequencing;Requires verbal cues;Requires tactile cues;Slow processing General Comments: Pt requiring increased assist for cognition. Pt requiring 1 step commands with multimodal cues to complete a turn, walking around bed and unable to distinguish R from L   General Comments  Pt's daughter in room and reports that pt was Independent prior. Pt's VSS on RA.    Exercises     Shoulder  Instructions      Home Living Family/patient expects to be discharged to:: Private residence Living Arrangements: Non-relatives/Friends Available Help at Discharge: Family;Available PRN/intermittently;Friend(s) Type of Home: House Home Access: Stairs to enter [employer] of Steps: 5 Entrance Stairs-Rails: None Home Layout: Two level Alternate Level Stairs-Number of Steps: 13 Alternate Level Stairs-Rails: Left Bathroom Shower/Tub: Teacher, early years/pre: Standard     Home Equipment: Environmental consultant - 2 wheels;Cane - single point          Prior Functioning/Environment Level of Independence: Independent        Comments: Cooking, cleaning, still driving and lives with a friend        OT Problem List: Decreased strength;Decreased activity tolerance;Impaired balance (sitting and/or standing);Decreased cognition;Decreased safety awareness;Decreased knowledge of precautions;Decreased coordination;Impaired vision/perception      OT Treatment/Interventions: Self-care/ADL training;Therapeutic exercise;Neuromuscular education;Energy conservation;DME and/or AE instruction;Therapeutic activities;Cognitive remediation/compensation;Visual/perceptual remediation/compensation;Patient/family education;Balance training    OT Goals(Current goals can be found in the care plan section) Acute Rehab OT Goals Patient Stated Goal: to get pain medicine for my headache OT Goal Formulation: With patient/family Time For Goal Achievement: 01/21/21 Potential to Achieve Goals: Good ADL Goals Pt Will Perform Grooming: with supervision;standing Pt Will Transfer to Toilet: with min guard assist;ambulating;regular height toilet Pt Will Perform Toileting - Clothing Manipulation and hygiene: with min guard assist;sit to/from stand Additional ADL Goal #1: pt will participate in higher level cognitive tasks with <2 verbal cues to attend to task. Additional ADL Goal #2: Pt will perform x10 mins of  ADL tasks in non distracting environment with minimal cues for sequencing.  OT Frequency: Min 2X/week   Barriers to D/C:            Co-evaluation              AM-PAC OT "6 Clicks" Daily Activity     Outcome Measure Help from another person eating meals?: None Help from  another person taking care of personal grooming?: A Little Help from another person toileting, which includes using toliet, bedpan, or urinal?: A Lot Help from another person bathing (including washing, rinsing, drying)?: A Lot Help from another person to put on and taking off regular upper body clothing?: A Little Help from another person to put on and taking off regular lower body clothing?: A Lot 6 Click Score: 16   End of Session Equipment Utilized During Treatment: Gait belt Nurse Communication: Mobility status  Activity Tolerance: Patient tolerated treatment well Patient left: in bed;with call bell/phone within reach;with family/visitor present  OT Visit Diagnosis: Unsteadiness on feet (R26.81);Muscle weakness (generalized) (M62.81);Other symptoms and signs involving cognitive function                Time: 1041-1057 OT Time Calculation (min): 16 min Charges:  OT General Charges $OT Visit: 1 Visit OT Evaluation $OT Eval Moderate Complexity: 1 Mod  Jefferey Pica, OTR/L Acute Rehabilitation Services Pager: [phone] Office: [phone]   Malanie Koloski C 01/07/2021, 12:39 PM

## 2021-01-07 NOTE — Progress Notes (Signed)
Pt admitted to 3W10 at this time.  Alert and oriented.  Daughter at bedside.  Telemetry placed on patient, CCMD called, and second verification completed.  Bed in low position, call bell within reach, bed alarm set; pt verbalizes understanding to call before attempting to get out of bed.

## 2021-01-07 NOTE — Progress Notes (Signed)
  Echocardiogram 2D Echocardiogram has been performed.  Bobbye Charleston 01/07/2021, 10:36 AM

## 2021-01-07 NOTE — ED Notes (Signed)
Tele Breakfast order placed 

## 2021-01-07 NOTE — ED Notes (Signed)
MD paged for diet order

## 2021-01-07 NOTE — Progress Notes (Signed)
VASCULAR LAB    Bilateral lower extremity venous duplex has been performed.  See CV proc for preliminary results.   Delano Scardino, RVT 01/07/2021, 11:09 AM

## 2021-01-07 NOTE — Progress Notes (Signed)
01/07/21 1408  PT Visit Information  Last PT Received On 01/07/21  Assistance Needed +1  PT/OT/SLP Co-Evaluation/Treatment Yes  Reason for Co-Treatment Complexity of the patient's impairments (multi-system involvement);Necessary to address cognition/behavior during functional activity;For patient/therapist safety  PT goals addressed during session Mobility/safety with mobility;Balance  History of Present Illness Pt is a 80 yo female admitted secondary to slurred speech. 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 PMHx: R breast cancer s/p lumpectomy and HTN.  Precautions  Precautions Fall  Restrictions  Weight Bearing Restrictions No  Home Living  Family/patient expects to be discharged to: Private residence  Living Arrangements Non-relatives/Friends  Available Help at Discharge Family;Available PRN/intermittently;Friend(s)  Type of Home House  Home Access Stairs to enter  Entrance Stairs-Number of Steps 5  Entrance Stairs-Rails None  Home Layout Two level  Alternate Level Stairs-Number of Steps 13  Alternate Level Stairs-Rails Left  Bathroom Shower/Tub Tub/shower unit  Tax adviser - 2 wheels;Cane - single point  Prior Function  Level of Independence Independent  Comments Cooking, cleaning, still driving and lives with a friend  Communication  Communication Expressive difficulties  Pain Assessment  Pain Assessment 0-10  Pain Score 6  Pain Location headache  Pain Descriptors / Indicators Headache  Pain Intervention(s) Limited activity within patient's tolerance;Monitored during session;Repositioned;Patient requesting pain meds-RN notified  Cognition  Arousal/Alertness Awake/alert  Behavior During Therapy WFL for tasks assessed/performed  Overall Cognitive Status Impaired/Different from baseline  Area of Impairment  Orientation;Attention;Memory;Safety/judgement;Awareness;Problem solving;Following commands  Orientation Level Disoriented to;Situation;Time  Current Attention Level Selective  Memory Decreased short-term memory  Following Commands Follows one step commands inconsistently;Follows one step commands with increased time  Safety/Judgement Decreased awareness of safety;Decreased awareness of deficits  Awareness Emergent  Problem Solving Decreased initiation;Difficulty sequencing;Requires verbal cues;Requires tactile cues;Slow processing  General Comments Pt requiring increased assist for cognition. Pt requiring 1 step commands with multimodal cues to complete a turn, walking around bed and unable to distinguish R from L.  Upper Extremity Assessment  Upper Extremity Assessment Defer to OT evaluation  Lower Extremity Assessment  Lower Extremity Assessment Generalized weakness  Cervical / Trunk Assessment  Cervical / Trunk Assessment Normal  Bed Mobility  Overal bed mobility Needs Assistance  Bed Mobility Supine to Sit;Sit to Supine  Supine to sit Min assist  Sit to supine Min assist  General bed mobility comments Assist for trunk and guidance of LB  Transfers  Overall transfer level Needs assistance  Equipment used 2 person hand held assist  Transfers Sit to/from Stand  Sit to Stand Min assist;+2 physical assistance;+2 safety/equipment  General transfer comment minA +2 for stability to stand at EOB.  Ambulation/Gait  Ambulation/Gait assistance Min assist;+2 physical assistance;+2 safety/equipment  Gait Distance (Feet) 10 Feet  Assistive device 2 person hand held assist;1 person hand held assist  Gait Pattern/deviations Step-through pattern;Decreased stride length  General Gait Details Pt with difficulty sequencing to perform turns and required multimodal cues for sequencing. Difficulty distinguishing L from R. Min A for steadying.  Gait velocity Decreased  Balance  Overall balance  assessment Needs assistance  Sitting-balance support Bilateral upper extremity supported;Feet supported  Sitting balance-Leahy Scale Poor  Standing balance support Bilateral upper extremity supported  Standing balance-Leahy Scale Poor  Standing balance comment assist to stabilize pt  General Comments  General comments (skin integrity, edema, etc.) Pt's daughter in room and reports that pt  was Independent prior. Pt's VSS on RA.  PT - End of Session  Activity Tolerance Patient tolerated treatment well  Patient left in bed;with call bell/phone within reach (on stretcher in  ED)  Nurse Communication Mobility status  PT Assessment  PT Recommendation/Assessment Patient needs continued PT services  PT Visit Diagnosis Unsteadiness on feet (R26.81);Other symptoms and signs involving the nervous system (R29.898)  PT Problem List Decreased strength;Decreased activity tolerance;Decreased balance;Decreased mobility;Decreased knowledge of precautions;Decreased cognition;Decreased knowledge of use of DME;Decreased safety awareness  PT Plan  PT Frequency (ACUTE ONLY) Min 4X/week  PT Treatment/Interventions (ACUTE ONLY) DME instruction;Gait training;Stair training;Functional mobility training;Therapeutic activities;Therapeutic exercise;Balance training;Patient/family education;Cognitive remediation  AM-PAC PT "6 Clicks" Mobility Outcome Measure (Version 2)  Help needed turning from your back to your side while in a flat bed without using bedrails? 3  Help needed moving from lying on your back to sitting on the side of a flat bed without using bedrails? 3  Help needed moving to and from a bed to a chair (including a wheelchair)? 3  Help needed standing up from a chair using your arms (e.g., wheelchair or bedside chair)? 3  Help needed to walk in hospital room? 3  Help needed climbing 3-5 steps with a railing?  2  6 Click Score 17  Consider Recommendation of Discharge To: Home with [facility]  PT Recommendation   Recommendations for Other Services Rehab consult  Follow Up Recommendations CIR  PT equipment 3in1 (PT)  Individuals Consulted  Consulted and Agree with Results and Recommendations Patient;Family member/caregiver  Family Member Consulted daughter  Acute Rehab PT Goals  Patient Stated Goal to get pain medicine for my headache  PT Goal Formulation With patient  Time For Goal Achievement 01/21/21  Potential to Achieve Goals Good  PT Time Calculation  PT Start Time (ACUTE ONLY) 1039  PT Stop Time (ACUTE ONLY) 1059  PT Time Calculation (min) (ACUTE ONLY) 20 min  PT General Charges  $$ ACUTE PT VISIT 1 Visit  PT Evaluation  $PT Eval Moderate Complexity 1 Mod  Written Expression  Dominant Hand Right   Pt admitted secondary to problem above with deficits below. Pt with poor sequencing and required multimodal cues for ambulation, especially when turning. Noted difficulty with distinguishing L vs R and very easily distractible. Pt requiring min A for steadying assist as pt unsteady. Feel she would benefit from CIR level therapies as pt previously independent. Has excellent family support at home. Will continue to follow acutely.   Reuel Derby, PT, DPT  Acute Rehabilitation Services  Pager: [phone] Office: [phone]

## 2021-01-07 NOTE — Progress Notes (Signed)
Inpatient Rehab Admissions Coordinator Note:   Per therapy recommendations, pt was screened for CIR candidacy by Gilles Trimpe, MS CCC-SLP. At this time, Pt. Appears to have functional decline and is a good candidate for CIR. Will place order for rehab consult per protocol.  Please contact me with questions.   Dream Harman, MS, CCC-SLP Rehab Admissions Coordinator  [phone] (celll) [phone] (office)  

## 2021-01-07 NOTE — Progress Notes (Addendum)
STROKE TEAM PROGRESS NOTE   INTERVAL HISTORY Patient is observed at the bedside.  Daughter present at the bedside.  Patient denies any complaints.  Vitals stable.  Patient afebrile.  On examination patient has left sided gaze preference with right sided neglect.  No ataxia observed.  No aphasia but dysarthria observed. Right hand weakness with clumsiness observed with fast tapping and FNT.   Echo shows LVEF 60 to 65%, lower extremity Doppler shows no DVT, Hb A1c 6.0, LDL 68, creatinine 0.83. Repeat CT head  (01/07/21) showed acute infarct of left frontal lobe with developing hypodensity but no hemorrhage. Patient is on DAPT (aspirin 81 mg and Plavix 75 mg). PT recommend CIR.   Vitals:   01/07/21 0945 01/07/21 1015 01/07/21 1045 01/07/21 1104  BP: 125/63 (!) 145/63 137/81   Pulse: 79 69 81   Resp: (!) 21 19 (!) 25   Temp:    98.4 F (36.9 C)  TempSrc:    Oral  SpO2: 96% 97% 96%   Weight:      Height:       CBC:  Recent Labs  Lab 01/06/21 1652 01/06/21 1654 01/07/21 0356  WBC 5.0  --  6.1  NEUTROABS 3.0  --   --   HGB 10.3* 11.2* 10.2*  HCT 32.5* 33.0* 32.3*  MCV 97.0  --  95.6  PLT 190  --  0000000   Basic Metabolic Panel:  Recent Labs  Lab 01/06/21 1652 01/06/21 1654 01/07/21 0356  NA 137 137 139  K 4.0 4.0 4.4  CL 103 103 105  CO2 23  --  24  GLUCOSE 141* 130* 125*  BUN 19 22 13   CREATININE 0.88 0.70 0.83  CALCIUM 9.0  --  8.8*   Lipid Panel:  Recent Labs  Lab 01/07/21 0356  CHOL 154  TRIG 139  HDL 58  CHOLHDL 2.7  VLDL 28  LDLCALC 68   HgbA1c:  Recent Labs  Lab 01/07/21 0357  HGBA1C 6.0*   Urine Drug Screen: No results for input(s): LABOPIA, COCAINSCRNUR, LABBENZ, AMPHETMU, THCU, LABBARB in the last 168 hours.  Alcohol Level No results for input(s): ETH in the last 168 hours.  IMAGING past 24 hours CT HEAD WO CONTRAST  Result Date: 01/07/2021 CLINICAL DATA:  Stroke follow-up EXAM: CT HEAD WITHOUT CONTRAST TECHNIQUE: Contiguous axial images were  obtained from the base of the skull through the vertex without intravenous contrast. COMPARISON:  CT head and MRI head 01/06/2021 FINDINGS: Brain: 3.5 cm hypodensity left frontal lobe compatible with acute infarct. This shows restricted diffusion on MRI. Mild local mass-effect. No midline shift. No acute hemorrhage. Ventricle size normal.  No other area of acute infarct. Vascular: Negative for hyperdense vessel Skull: Negative Sinuses/Orbits: Mild mucosal edema left maxillary sinus otherwise clear sinuses. Bilateral cataract extraction Other: None IMPRESSION: Acute infarct left frontal lobe with developing hypodensity but no acute hemorrhage. No midline shift. Electronically Signed   By: Franchot Gallo M.D.   On: 01/07/2021 13:23   MR BRAIN WO CONTRAST  Result Date: 01/06/2021 CLINICAL DATA:  Hypertension.  TIA presentation. EXAM: MRI HEAD WITHOUT CONTRAST TECHNIQUE: Multiplanar, multiecho pulse sequences of the brain and surrounding structures were obtained without intravenous contrast. COMPARISON:  CT studies earlier same day FINDINGS: Brain: Diffusion imaging shows a 3.5 cm region of acute infarction in the left frontal operculum. Mild swelling but no evidence of hemorrhage. Second separate area of acute infarction posterior to that in the left parietal lobe, considerably smaller. No other  vascular territory insult. Brainstem and cerebellum are normal. Cerebral hemispheres elsewhere not show evidence of small-vessel disease or other large vessel infarction. No mass, hydrocephalus or extra-axial collection. Vascular: Major vessels at the base of the brain show flow. Skull and upper cervical spine: Negative Sinuses/Orbits: Clear/normal Other: None IMPRESSION: 1. 3.5 cm region of acute infarction in the left frontal operculum. Mild swelling but no hemorrhage. Findings consistent with distal vessel left MCA occlusion, at least M3. 2. Second separate area of acute infarction posterior to that in the left parietal  lobe, considerably smaller. This is also consistent with a distal branch vessel left MCA occlusion. Electronically Signed   By: Nelson Chimes M.D.   On: 01/06/2021 22:08   ECHOCARDIOGRAM COMPLETE  Result Date: 01/07/2021    ECHOCARDIOGRAM REPORT   Patient Name:   JARDYN EALY Date of Exam: 01/07/2021 Medical Rec #:  [id number]        Height:       66.0 in Accession #:    [id number]       Weight:       173.7 lb Date of Birth:  December 07, 1940         BSA:          1.884 m Patient Age:    80 years         BP:           129/100 mmHg Patient Gender: F                HR:           77 bpm. Exam Location:  Inpatient Procedure: 2D Echo, 3D Echo, Cardiac Doppler and Color Doppler Indications:    TIA  History:        Patient has no prior history of Echocardiogram examinations.                 Risk Factors:Hypertension and Dyslipidemia. Breast cancer.  Sonographer:    Roseanna Rainbow RDCS Referring Phys: [id number] [place]  Sonographer Comments: Suboptimal parasternal window. IMPRESSIONS  1. Left ventricular ejection fraction, by estimation, is 60 to 65%. The left ventricle has normal function. The left ventricle has no regional wall motion abnormalities. There is mild left ventricular hypertrophy. Left ventricular diastolic parameters are consistent with Grade II diastolic dysfunction (pseudonormalization).  2. Right ventricular systolic function is normal. The right ventricular size is normal. There is normal pulmonary artery systolic pressure. The estimated right ventricular systolic pressure is [id number] mmHg.  3. The mitral valve is normal in structure. Mild mitral valve regurgitation. No evidence of mitral stenosis.  4. The aortic valve is tricuspid. Aortic valve regurgitation is mild to moderate. Mild aortic valve sclerosis is present, with no evidence of aortic valve stenosis.  5. The inferior vena cava is normal in size with greater than 50% respiratory variability, suggesting right atrial pressure of 3 mmHg. FINDINGS  Left  Ventricle: Left ventricular ejection fraction, by estimation, is 60 to 65%. The left ventricle has normal function. The left ventricle has no regional wall motion abnormalities. The left ventricular internal cavity size was normal in size. There is  mild left ventricular hypertrophy. Left ventricular diastolic parameters are consistent with Grade II diastolic dysfunction (pseudonormalization). Right Ventricle: The right ventricular size is normal. No increase in right ventricular wall thickness. Right ventricular systolic function is normal. There is normal pulmonary artery systolic pressure. The tricuspid regurgitant velocity is 2.46 m/s, and  with an assumed right atrial pressure of 3  mmHg, the estimated right ventricular systolic pressure is 66.0 mmHg. Left Atrium: Left atrial size was normal in size. Right Atrium: Right atrial size was normal in size. Pericardium: There is no evidence of pericardial effusion. Mitral Valve: The mitral valve is normal in structure. There is mild calcification of the mitral valve leaflet(s). Mild mitral annular calcification. Mild mitral valve regurgitation. No evidence of mitral valve stenosis. MV peak gradient, 8.6 mmHg. The mean mitral valve gradient is 3.0 mmHg. Tricuspid Valve: The tricuspid valve is normal in structure. Tricuspid valve regurgitation is trivial. Aortic Valve: The aortic valve is tricuspid. Aortic valve regurgitation is mild to moderate. Aortic regurgitation PHT measures 382 msec. Mild aortic valve sclerosis is present, with no evidence of aortic valve stenosis. Pulmonic Valve: The pulmonic valve was normal in structure. Pulmonic valve regurgitation is trivial. Aorta: The aortic root is normal in size and structure. Venous: The inferior vena cava is normal in size with greater than 50% respiratory variability, suggesting right atrial pressure of 3 mmHg. IAS/Shunts: No atrial level shunt detected by color flow Doppler.  LEFT VENTRICLE PLAX 2D LVIDd:         3.60  cm      Diastology LVIDs:         2.50 cm      LV e' medial:    6.96 cm/s LV PW:         1.30 cm      LV E/e' medial:  22.3 LV IVS:        1.10 cm      LV e' lateral:   9.79 cm/s LVOT diam:     1.70 cm      LV E/e' lateral: 15.8 LV SV:         45 LV SV Index:   24 LVOT Area:     2.27 cm                              3D Volume EF: LV Volumes (MOD)            3D EF:        61 % LV vol d, MOD A2C: 101.0 ml LV EDV:       143 ml LV vol d, MOD A4C: 66.6 ml  LV ESV:       56 ml LV vol s, MOD A2C: 39.5 ml  LV SV:        87 ml LV vol s, MOD A4C: 26.2 ml LV SV MOD A2C:     61.5 ml LV SV MOD A4C:     66.6 ml LV SV MOD BP:      58.4 ml RIGHT VENTRICLE             IVC RV S prime:     12.00 cm/s  IVC diam: 1.40 cm TAPSE (M-mode): 2.3 cm LEFT ATRIUM             Index       RIGHT ATRIUM           Index LA diam:        3.70 cm 1.96 cm/m  RA Area:     13.70 cm LA Vol (A2C):   29.9 ml 15.87 ml/m RA Volume:   27.60 ml  14.65 ml/m LA Vol (A4C):   41.0 ml 21.77 ml/m LA Biplane Vol: 35.7 ml 18.95 ml/m  AORTIC VALVE LVOT Vmax:   89.40 cm/s LVOT  Vmean:  57.200 cm/s LVOT VTI:    0.199 m AI PHT:      382 msec  AORTA Ao Asc diam: 3.60 cm MITRAL VALVE                TRICUSPID VALVE MV Area (PHT): 3.77 cm     TR Peak grad:   24.2 mmHg MV Area VTI:   1.14 cm     TR Vmax:        246.00 cm/s MV Peak grad:  8.6 mmHg MV Mean grad:  3.0 mmHg     SHUNTS MV Vmax:       1.47 m/s     Systemic VTI:  0.20 m MV Vmean:      76.5 cm/s    Systemic Diam: 1.70 cm MV Decel Time: 201 msec MV E velocity: 155.00 cm/s MV A velocity: 104.00 cm/s MV E/A ratio:  1.49 Loralie Champagne MD Electronically signed by Loralie Champagne MD Signature Date/Time: 01/07/2021/2:37:34 PM    Final    CT HEAD CODE STROKE WO CONTRAST  Result Date: 01/06/2021 CLINICAL DATA:  Code stroke. Neuro deficit, acute stroke suspected. Slurred speech with right-sided facial droop. EXAM: CT HEAD WITHOUT CONTRAST TECHNIQUE: Contiguous axial images were obtained from the base of the skull through  the vertex without intravenous contrast. COMPARISON:  None. FINDINGS: Brain: No evidence of acute large vascular territory infarction, hemorrhage, hydrocephalus, extra-axial collection or mass lesion/mass effect. Mild patchy white matter hypoattenuation, most likely related to chronic microvascular ischemic disease. Vascular: No hyperdense vessel identified. Calcific atherosclerosis. Skull: No acute fracture. Sinuses/Orbits: No acute findings. Other: No mastoid effusions. ASPECTS [facility] Stroke Program Early CT Score) total score (0-10 with 10 being normal): 10. IMPRESSION: 1. No evidence of acute intracranial abnormality. 2. ASPECTS is 10. Code stroke imaging results were communicated on 01/06/2021 at 5:00 pm to provider Dr. Theda Sers via telephone, who verbally acknowledged these results. Electronically Signed   By: Margaretha Sheffield MD   On: 01/06/2021 17:03   VAS [place] LOWER EXTREMITY VENOUS (DVT)  Result Date: 01/07/2021  Lower Venous DVT Study Indications: Stroke.  Comparison Study: No prior study on file Performing Technologist: Sharion Dove RVS  Examination Guidelines: A complete evaluation includes B-mode imaging, spectral Doppler, color Doppler, and power Doppler as needed of all accessible portions of each vessel. Bilateral testing is considered an integral part of a complete examination. Limited examinations for reoccurring indications may be performed as noted. The reflux portion of the exam is performed with the patient in reverse Trendelenburg.  +---------+---------------+---------+-----------+----------+--------------+ RIGHT    CompressibilityPhasicitySpontaneityPropertiesThrombus Aging +---------+---------------+---------+-----------+----------+--------------+ CFV      Full           Yes      Yes                                 +---------+---------------+---------+-----------+----------+--------------+ SFJ      Full                                                         +---------+---------------+---------+-----------+----------+--------------+ FV Prox  Full                                                        +---------+---------------+---------+-----------+----------+--------------+  FV Mid   Full                                                        +---------+---------------+---------+-----------+----------+--------------+ FV DistalFull                                                        +---------+---------------+---------+-----------+----------+--------------+ PFV      Full                                                        +---------+---------------+---------+-----------+----------+--------------+ POP      Full           Yes      Yes                                 +---------+---------------+---------+-----------+----------+--------------+ PTV      Full                                                        +---------+---------------+---------+-----------+----------+--------------+ PERO     Full                                                        +---------+---------------+---------+-----------+----------+--------------+   +---------+---------------+---------+-----------+----------+--------------+ LEFT     CompressibilityPhasicitySpontaneityPropertiesThrombus Aging +---------+---------------+---------+-----------+----------+--------------+ CFV      Full           Yes      Yes                                 +---------+---------------+---------+-----------+----------+--------------+ SFJ      Full                                                        +---------+---------------+---------+-----------+----------+--------------+ FV Prox  Full                                                        +---------+---------------+---------+-----------+----------+--------------+ FV Mid   Full                                                         +---------+---------------+---------+-----------+----------+--------------+  FV DistalFull                                                        +---------+---------------+---------+-----------+----------+--------------+ PFV      Full                                                        +---------+---------------+---------+-----------+----------+--------------+ POP      Full           Yes      Yes                                 +---------+---------------+---------+-----------+----------+--------------+ PTV      Full                                                        +---------+---------------+---------+-----------+----------+--------------+ PERO     Full                                                        +---------+---------------+---------+-----------+----------+--------------+     Summary: BILATERAL: - No evidence of deep vein thrombosis seen in the lower extremities, bilaterally. -   *See table(s) above for measurements and observations.    Preliminary    CT ANGIO HEAD CODE STROKE  Result Date: 01/06/2021 CLINICAL DATA:  Stroke/TIA, assess extracranial arteries. EXAM: CT HEAD WITHOUT CONTRAST CT ANGIOGRAPHY OF THE HEAD AND NECK TECHNIQUE: Contiguous axial images were obtained from the base of the skull through the vertex without intravenous contrast. Multidetector CT imaging of the head and neck was performed using the standard protocol during bolus administration of intravenous contrast. Multiplanar CT image reconstructions and MIPs were obtained to evaluate the vascular anatomy. Carotid stenosis measurements (when applicable) are obtained utilizing NASCET criteria, using the distal internal carotid diameter as the denominator. CONTRAST:  84mL OMNIPAQUE IOHEXOL 350 MG/ML SOLN COMPARISON:  Same day head CT FINDINGS: CTA NECK Aortic arch: Great vessel origins are patent. Right carotid system: No evidence of hemodynamically significant stenosis or dissection. Left  carotid system: No evidence of hemodynamically significant stenosis or dissection. Vertebral arteries:Codominant. No evidence of significant stenosis or dissection. Skeleton: Degenerative changes of the cervical spine, greatest at C5-C6 where there is disc height loss, endplate sclerosis and posterior disc osteophyte complex. There is osseous fusion across the C7-T1 vertebral bodies, possibly congenital. Other neck: No evidence of mass or suspicious adenopathy. CTA HEAD Anterior circulation: No evidence of large vessel occlusion, proximal hemodynamically significant stenosis or aneurysm. Mild calcific atherosclerosis of bilateral cavernous carotid arteries. Posterior circulation: No evidence of large vessel occlusion, proximal hemodynamically significant stenosis or aneurysm. Mild stenosis of the intradural left vertebral artery. wMildly dysplastic basilar tip without discrete aneurysm. Venous sinuses: Within the limitation of arterial timing, no evidence of  dural sinus thrombosis. Small left transverse sinus. IMPRESSION: No evidence of large vessel occlusion or proximal hemodynamically significant stenosis in the head or neck. Findings were discussed with Dr. Theda Sers via telephone on 01/06/2021 at 5:00 p.m. Electronically Signed   By: Margaretha Sheffield MD   On: 01/06/2021 18:06   CT ANGIO NECK CODE STROKE  Result Date: 01/06/2021 CLINICAL DATA:  Stroke/TIA, assess extracranial arteries. EXAM: CT HEAD WITHOUT CONTRAST CT ANGIOGRAPHY OF THE HEAD AND NECK TECHNIQUE: Contiguous axial images were obtained from the base of the skull through the vertex without intravenous contrast. Multidetector CT imaging of the head and neck was performed using the standard protocol during bolus administration of intravenous contrast. Multiplanar CT image reconstructions and MIPs were obtained to evaluate the vascular anatomy. Carotid stenosis measurements (when applicable) are obtained utilizing NASCET criteria, using the distal  internal carotid diameter as the denominator. CONTRAST:  20mL OMNIPAQUE IOHEXOL 350 MG/ML SOLN COMPARISON:  Same day head CT FINDINGS: CTA NECK Aortic arch: Great vessel origins are patent. Right carotid system: No evidence of hemodynamically significant stenosis or dissection. Left carotid system: No evidence of hemodynamically significant stenosis or dissection. Vertebral arteries:Codominant. No evidence of significant stenosis or dissection. Skeleton: Degenerative changes of the cervical spine, greatest at C5-C6 where there is disc height loss, endplate sclerosis and posterior disc osteophyte complex. There is osseous fusion across the C7-T1 vertebral bodies, possibly congenital. Other neck: No evidence of mass or suspicious adenopathy. CTA HEAD Anterior circulation: No evidence of large vessel occlusion, proximal hemodynamically significant stenosis or aneurysm. Mild calcific atherosclerosis of bilateral cavernous carotid arteries. Posterior circulation: No evidence of large vessel occlusion, proximal hemodynamically significant stenosis or aneurysm. Mild stenosis of the intradural left vertebral artery. wMildly dysplastic basilar tip without discrete aneurysm. Venous sinuses: Within the limitation of arterial timing, no evidence of dural sinus thrombosis. Small left transverse sinus. IMPRESSION: No evidence of large vessel occlusion or proximal hemodynamically significant stenosis in the head or neck. Findings were discussed with Dr. Theda Sers via telephone on 01/06/2021 at 5:00 p.m. Electronically Signed   By: Margaretha Sheffield MD   On: 01/06/2021 18:06    PHYSICAL EXAM GENERAL: Awake, alert in NAD. Oriented x4.  HEENT: - Normocephalic and atraumatic LUNGS - Symmetrical chest rise, No labored breathing noted CV - no JVD, No Peripheral Edema ABDOMEN - Soft,  nondistended  Ext: warm, well perfused, intact peripheral pulses, no Peripheral edema  NEURO Exam:   Mental Status: AA&Ox3. Oriented to self,  age, place, time and situation. Knows month and year. Good Attention and Concentration Language: speech is mildly disarthric.  Pt is able to name simple objects, repetition intact,  fluency, and comprehension intact. Cranial Nerves:   CN II Pupils equal and reactive to light, no VF deficits   CN III,IV,VI EOM intact, Lt gaze preference, no nystagmus. Rt sided Neglect present   CN V normal sensation in V1, V2, and V3 segments bilaterally   CN VII no asymmetry, no nasolabial fold flattening    CN VIII normal hearing to speech    CN IX & X normal palatal elevation, no uvular deviation    CN XI 5/5 head turn and 5/5 shoulder shrug bilaterally    CN XII midline tongue protrusion    Motor: No Drift in Upper Extremities, No Drift in LE's. Strength 5/5 in Rt UE, Mild decreased strength observed on RT Hand demonstrated by clumsiness with fast finger tapping. 5/5 in Lt UE, Strength in Rt LE  5/5, Lt LE  5/5 Tone: is normal and bulk is normal Sensation- Intact to light touch bilaterally Coordination: FTN intact bilateral but clumsiness on Rt side. No ataxia  Gait- deferred  ASSESSMENT/PLAN Ms. Crystal Trevino is a 80 y.o. female with history of  HLD, HTN, PVD, and hypothyroidism presenting with slurred speech, difficulty finding her words, and right facial droop. Initial CT Head negative for acute intracranial abnormality. CTA head and neck shows no large vessel occlusion. MRI showed 3.5 cm left frontal opercular and left parietal infarct with distal left MCA M3 occlusion. tPA was not given. Echo shows LVEF 60 to 65%, lower extremity Doppler shows no DVT, Hb A1c 6.0, LDL 68, creatinine 0.83. Repeat CT head  (01/07/21) showed acute infarct of left frontal lobe with developing hypodensity but no hemorrhage. Patient is on DAPT (aspirin 81 mg and Plavix 75 mg). Request for Loop recorder placed. PT recommends CIR.   Stroke:  left MCA infarcts embolic secondary to unknown source, concerning for embolic.   Code  Stroke CT head No acute abnormality. ASPECTS 10.     CTA head & neck no LVO  MRI  L frontal opercular and L parietal infarct; distal L MCA M3 occlusion    Repeat CT head  (01/07/21) showed acute infarct of left frontal lobe with developing hypodensity but no hemorrhage.  LE Doppler  No DVT  2D Echo- LVEF 60 to 65%, mild to moderate AVR and mild MVR  Recommend loop recorder to rule out A. fib  LDL 68  HgbA1c 6.0  VTE prophylaxis - Lovenox 40 mg sq daily   aspirin 81 mg daily prior to admission, now on aspirin 81 mg daily and clopidogrel 75 mg daily. Continue DAPT x 21 days then plavix alone  Therapy recommendations:  CIR  Disposition:  pending   Hypertension  Stable . Permissive hypertension (OK if < 220/120) but gradually normalize in 2-3 days . Long-term BP goal normotensive  Hyperlipidemia  Home meds:  zocor 20, resumed in hospital  LDL 68, goal < 70  High intensity statin not indicated as pt LDL at goal  Continue statin at discharge  Other Stroke Risk Factors  Advanced Age >/= 74   PVD  Other Active Problems  Breast cancer 2019 s/p lumpectomy and XRT. On tomoxifen 3/5 yrs. On hold by IM for potential thromboembolic infarct   Hypothyroidism - TSH- 3.265 - On Synthroid 75 MCG daily  Hospital day # 0    ATTENDING NOTE: I reviewed above note and agree with the assessment and plan. Pt was seen and examined.   80 year old female with history of hypertension, hyperlipidemia, PVD aspirin 81 presented to ED for slurred speech, word finding difficulty, right facial droop.  Initially resolved, then returned.  Also there are some concern with right sensory neglect.  CT no acute abnormality.  CT head and neck no LVO.  MRI showed left MCA infarcts.  EF 60 to 65%.  LDL is 68 and A1c 6.0.  Creatinine 0.70.  LE venous Doppler no DVT.  On exam patient awake alert mildly lethargic due to headache and nausea.  Stat CT head did not show any hemorrhagic transformation.   Follow simple commands, no aphasia, no significant dysarthria, however daughter felt patient still has some dysarthria.  We did find patient has left gaze preference, however able to have right gaze.  Visual field full, however, to have right simultanagnosia.  Right facial droop, tongue midline.  Left upper extremity no drift, however right decreased finger grip and  dexterity.  Bilateral lower extremity equal strengths.  No ataxia.  Patient does have right sensory neglect.  Etiology for patient stroke likely cardioembolic.  Source unclear at this moment.  However highly concerning for occult A. fib.  Recommend loop recorder for further A. fib evaluation.  Currently on DAPT for 21 days and then Plavix alone.  Continue Zocor 20.  Will follow.     I had long discussion with daughter at bedside, updated pt current condition, treatment plan and potential prognosis, and answered all the questions.  She expressed understanding and appreciation. Case discussed with Dr. Rockne Menghini at bedside.   Rosalin Hawking, MD PhD Stroke Neurology 01/07/2021 5:45 PM     To contact Stroke Continuity provider, please refer to [email or web address] After hours, contact General Neurology

## 2021-01-07 NOTE — Progress Notes (Signed)
Progress Note    Crystal Trevino  [id number] DOB: [birth year]  DOA: 01/06/2021 PCP: Cari Caraway, MD    Brief Narrative:   Chief complaint: Dysarthria/right facial droop  Medical records reviewed and are as summarized below:  Crystal Trevino is an 80 y.o. female with a PMH of hypertension, hyperlipidemia, hypothyroidism, right breast cancer s/p lumpectomy and adjuvant radiation currently on tamoxifen who presented to the ED via EMS for evaluation of dysarthria and right facial droop. Symptoms had resolved on arrival to ED. In the ED, VSS. Labs showed WBC 5.0, hemoglobin 10.3, platelets 190,000, sodium 137, potassium 4.0, bicarb 23, BUN 19, creatinine 0.8, serum glucose 141, LFT within normal limits.  SARS-CoV-2 PCR is negative. CT head without contrast was negative for evidence of acute intracranial abnormality.  CTA head/neck are negative for evidence of large vessel occlusion or proximal hemodynamically significant stenosis in the head or neck. Neurology was consulted and recommended further stroke work-up.  Patient was given aspirin 324 mg once and the hospitalist service was consulted to admit for further evaluation and management.  Assessment/Plan:   Principal Problem: Acute CVA with dysarthria/right facial droop, POA CT head and CTA head/neck negative.  Seen by neurology who recommended further stroke work-up and started on dual antiplatelet therapy.  MRI personally reviewed and showed acute stroke affecting the left frontal operculum and left parietal lobe. Received aspirin 325 mg in the ED. Full stroke work up in progress.  Will need a loop recorder to rule out PAF. Continue ASA/Plavix.  Allow permissive hypertension to perfuse brain. PT/OT/ST consultations pending. Total cholesterol 154, LDL 68.  Hgb A1c 6.0% (suggestive of pre-diabetes). Neurologist ordered a repeat CT given nausea/vomiting/headache to rule out bleed. No acute hemorrhage or midline shift.  Active  Problems: Hypertension, chronic, POA Holding home Coreg and telmisartan to allow permissive hypertension this time. OK to resume in next 24-48 hours.   Hyperlipidemia, chronic Continue home simvastatin, consider change to high intensity atorvastatin or rosuvastatin prior to discharge.  Hypothyroidism, chronic Continue levothyroxine. TSH 3.265, good control.  H/O Ductal carcinoma in situ right breast  S/P lumpectomy and adjuvant radiation.  Has been on tamoxifen for almost 3 years out of planned 5-year course.  Will hold tamoxifen at this time due to potential association with thromboembolic stroke.  Pre-diabetes, POA Hgb A1c consistent with pre-diabetes.  Will ask dietician to counsel on diet changes.  Nutritional status        Body mass index is 28.04 kg/m.   Family Communication/Anticipated D/C date and plan/Code Status   DVT prophylaxis: enoxaparin (LOVENOX) injection 40 mg Start: 01/06/21 2100   Current Level of Care:: Telemetry Medical Code Status: Full Code.  Family Communication: Daughter at bedside. Disposition Plan: Status is: Observation  The patient will require care spanning > 2 midnights and should be moved to inpatient because: Acute CVA with deficits.  Dispo: The patient is from: Home              Anticipated d/c is to: To be determined              Anticipated d/c date is: 2 days              Patient currently is not medically stable to d/c.   Difficult to place patient No    Medical Consultants:    Neurology   Anti-Infectives:    None  Subjective:   Crystal Trevino was actively vomiting when I was in the room.  She was diaphoretic and skin was hot to touch.  She had trouble articulating her words, and right sided neglect.  She was complaining of a frontal headache.  Objective:    Vitals:   01/07/21 0945 01/07/21 1015 01/07/21 1045 01/07/21 1104  BP: 125/63 (!) 145/63 137/81   Pulse: 79 69 81   Resp: (!) 21 19 (!) 25   Temp:    98.4  F (36.9 C)  TempSrc:    Oral  SpO2: 96% 97% 96%   Weight:      Height:       No intake or output data in the 24 hours ending 01/07/21 1405 Filed Weights   01/06/21 1600 01/06/21 1734  Weight: 74.8 kg 78.8 kg    Exam: General: Ill-appearing elderly female, actively vomiting. Cardiovascular: Heart sounds show a regular rate, and rhythm. No gallops or rubs. No murmurs. No JVD. Lungs: Clear to auscultation bilaterally with good air movement. No rales, rhonchi or wheezes. Abdomen: Soft, nontender, nondistended with normal active bowel sounds. No masses. No hepatosplenomegaly. Neurological: Alert and oriented. Moves all extremities 4 with decreased strength on the right, right facial droop, right-sided neglect. Has some dysarthria. Skin: Warm and dry. No rashes or lesions. Extremities: No clubbing or cyanosis. No edema. Pedal pulses 2+. Psychiatric: Mood and affect are flat. Insight and judgment are fair.     Data Reviewed:   I have personally reviewed following labs and imaging studies:  Labs: Labs show the following:   Basic Metabolic Panel: Recent Labs  Lab 01/06/21 1652 01/06/21 1654 01/07/21 0356  NA 137 137 139  K 4.0 4.0 4.4  CL 103 103 105  CO2 23  --  24  GLUCOSE 141* 130* 125*  BUN 19 22 13   CREATININE 0.88 0.70 0.83  CALCIUM 9.0  --  8.8*   GFR Estimated Creatinine Clearance: 59.2 mL/min (by C-G formula based on SCr of 0.83 mg/dL). Liver Function Tests: Recent Labs  Lab 01/06/21 1652  AST 18  ALT 11  ALKPHOS 52  BILITOT 0.6  PROT 6.1*  ALBUMIN 3.4*   Coagulation profile Recent Labs  Lab 01/06/21 1652  INR 0.9    CBC: Recent Labs  Lab 01/06/21 1652 01/06/21 1654 01/07/21 0356  WBC 5.0  --  6.1  NEUTROABS 3.0  --   --   HGB 10.3* 11.2* 10.2*  HCT 32.5* 33.0* 32.3*  MCV 97.0  --  95.6  PLT 190  --  195   CBG: Recent Labs  Lab 01/06/21 1647  GLUCAP 123*   Hgb A1c: Recent Labs    01/07/21 0357  HGBA1C 6.0*   Lipid  Profile: Recent Labs    01/07/21 0356  CHOL 154  HDL 58  LDLCALC 68  TRIG 139  CHOLHDL 2.7   Thyroid function studies: Recent Labs    01/07/21 0356  TSH 3.265    Microbiology Recent Results (from the past 240 hour(s))  SARS Coronavirus 2 by RT PCR (hospital order, performed in [facility] hospital lab) Nasopharyngeal Nasopharyngeal Swab     Status: None   Collection Time: 01/06/21  4:45 PM   Specimen: Nasopharyngeal Swab  Result Value Ref Range Status   SARS Coronavirus 2 NEGATIVE NEGATIVE Final    Comment: (NOTE) SARS-CoV-2 target nucleic acids are NOT DETECTED.  The SARS-CoV-2 RNA is generally detectable in upper and lower respiratory specimens during the acute phase of infection. The lowest concentration of SARS-CoV-2 viral copies this assay can detect is 250 copies /  mL. A negative result does not preclude SARS-CoV-2 infection and should not be used as the sole basis for treatment or other patient management decisions.  A negative result may occur with improper specimen collection / handling, submission of specimen other than nasopharyngeal swab, presence of viral mutation(s) within the areas targeted by this assay, and inadequate number of viral copies (<250 copies / mL). A negative result must be combined with clinical observations, patient history, and epidemiological information.  Fact Sheet for Patients:   StrictlyIdeas.no  Fact Sheet for Healthcare Providers: BankingDealers.co.za  This test is not yet approved or  cleared by the [place] FDA and has been authorized for detection and/or diagnosis of SARS-CoV-2 by FDA under an Emergency Use Authorization (EUA).  This EUA will remain in effect (meaning this test can be used) for the duration of the COVID-19 declaration under Section 564(b)(1) of the Act, 21 U.S.C. section 360bbb-3(b)(1), unless the authorization is terminated or revoked sooner.  Performed  at [facility], [place] [address] [place], [place] [address]     Procedures and diagnostic studies:  CT HEAD WO CONTRAST  Result Date: 01/07/2021 CLINICAL DATA:  Stroke follow-up EXAM: CT HEAD WITHOUT CONTRAST TECHNIQUE: Contiguous axial images were obtained from the base of the skull through the vertex without intravenous contrast. COMPARISON:  CT head and MRI head 01/06/2021 FINDINGS: Brain: 3.5 cm hypodensity left frontal lobe compatible with acute infarct. This shows restricted diffusion on MRI. Mild local mass-effect. No midline shift. No acute hemorrhage. Ventricle size normal.  No other area of acute infarct. Vascular: Negative for hyperdense vessel Skull: Negative Sinuses/Orbits: Mild mucosal edema left maxillary sinus otherwise clear sinuses. Bilateral cataract extraction Other: None IMPRESSION: Acute infarct left frontal lobe with developing hypodensity but no acute hemorrhage. No midline shift. Electronically Signed   By: Franchot Gallo M.D.   On: 01/07/2021 13:23   MR BRAIN WO CONTRAST  Result Date: 01/06/2021 CLINICAL DATA:  Hypertension.  TIA presentation. EXAM: MRI HEAD WITHOUT CONTRAST TECHNIQUE: Multiplanar, multiecho pulse sequences of the brain and surrounding structures were obtained without intravenous contrast. COMPARISON:  CT studies earlier same day FINDINGS: Brain: Diffusion imaging shows a 3.5 cm region of acute infarction in the left frontal operculum. Mild swelling but no evidence of hemorrhage. Second separate area of acute infarction posterior to that in the left parietal lobe, considerably smaller. No other vascular territory insult. Brainstem and cerebellum are normal. Cerebral hemispheres elsewhere not show evidence of small-vessel disease or other large vessel infarction. No mass, hydrocephalus or extra-axial collection. Vascular: Major vessels at the base of the brain show flow. Skull and upper cervical spine: Negative Sinuses/Orbits: Clear/normal Other: None  IMPRESSION: 1. 3.5 cm region of acute infarction in the left frontal operculum. Mild swelling but no hemorrhage. Findings consistent with distal vessel left MCA occlusion, at least M3. 2. Second separate area of acute infarction posterior to that in the left parietal lobe, considerably smaller. This is also consistent with a distal branch vessel left MCA occlusion. Electronically Signed   By: Nelson Chimes M.D.   On: 01/06/2021 22:08   CT HEAD CODE STROKE WO CONTRAST  Result Date: 01/06/2021 CLINICAL DATA:  Code stroke. Neuro deficit, acute stroke suspected. Slurred speech with right-sided facial droop. EXAM: CT HEAD WITHOUT CONTRAST TECHNIQUE: Contiguous axial images were obtained from the base of the skull through the vertex without intravenous contrast. COMPARISON:  None. FINDINGS: Brain: No evidence of acute large vascular territory infarction, hemorrhage, hydrocephalus, extra-axial collection or  mass lesion/mass effect. Mild patchy white matter hypoattenuation, most likely related to chronic microvascular ischemic disease. Vascular: No hyperdense vessel identified. Calcific atherosclerosis. Skull: No acute fracture. Sinuses/Orbits: No acute findings. Other: No mastoid effusions. ASPECTS [facility] Stroke Program Early CT Score) total score (0-10 with 10 being normal): 10. IMPRESSION: 1. No evidence of acute intracranial abnormality. 2. ASPECTS is 10. Code stroke imaging results were communicated on 01/06/2021 at 5:00 pm to provider Dr. Theda Sers via telephone, who verbally acknowledged these results. Electronically Signed   By: Margaretha Sheffield MD   On: 01/06/2021 17:03   VAS [place] LOWER EXTREMITY VENOUS (DVT)  Result Date: 01/07/2021  Lower Venous DVT Study Indications: Stroke.  Comparison Study: No prior study on file Performing Technologist: Sharion Dove RVS  Examination Guidelines: A complete evaluation includes B-mode imaging, spectral Doppler, color Doppler, and power Doppler as needed of all accessible  portions of each vessel. Bilateral testing is considered an integral part of a complete examination. Limited examinations for reoccurring indications may be performed as noted. The reflux portion of the exam is performed with the patient in reverse Trendelenburg.  +---------+---------------+---------+-----------+----------+--------------+ RIGHT    CompressibilityPhasicitySpontaneityPropertiesThrombus Aging +---------+---------------+---------+-----------+----------+--------------+ CFV      Full           Yes      Yes                                 +---------+---------------+---------+-----------+----------+--------------+ SFJ      Full                                                        +---------+---------------+---------+-----------+----------+--------------+ FV Prox  Full                                                        +---------+---------------+---------+-----------+----------+--------------+ FV Mid   Full                                                        +---------+---------------+---------+-----------+----------+--------------+ FV DistalFull                                                        +---------+---------------+---------+-----------+----------+--------------+ PFV      Full                                                        +---------+---------------+---------+-----------+----------+--------------+ POP      Full           Yes      Yes                                 +---------+---------------+---------+-----------+----------+--------------+  PTV      Full                                                        +---------+---------------+---------+-----------+----------+--------------+ PERO     Full                                                        +---------+---------------+---------+-----------+----------+--------------+   +---------+---------------+---------+-----------+----------+--------------+ LEFT      CompressibilityPhasicitySpontaneityPropertiesThrombus Aging +---------+---------------+---------+-----------+----------+--------------+ CFV      Full           Yes      Yes                                 +---------+---------------+---------+-----------+----------+--------------+ SFJ      Full                                                        +---------+---------------+---------+-----------+----------+--------------+ FV Prox  Full                                                        +---------+---------------+---------+-----------+----------+--------------+ FV Mid   Full                                                        +---------+---------------+---------+-----------+----------+--------------+ FV DistalFull                                                        +---------+---------------+---------+-----------+----------+--------------+ PFV      Full                                                        +---------+---------------+---------+-----------+----------+--------------+ POP      Full           Yes      Yes                                 +---------+---------------+---------+-----------+----------+--------------+ PTV      Full                                                        +---------+---------------+---------+-----------+----------+--------------+  PERO     Full                                                        +---------+---------------+---------+-----------+----------+--------------+     Summary: BILATERAL: - No evidence of deep vein thrombosis seen in the lower extremities, bilaterally. -   *See table(s) above for measurements and observations.    Preliminary    CT ANGIO HEAD CODE STROKE  Result Date: 01/06/2021 CLINICAL DATA:  Stroke/TIA, assess extracranial arteries. EXAM: CT HEAD WITHOUT CONTRAST CT ANGIOGRAPHY OF THE HEAD AND NECK TECHNIQUE: Contiguous axial images were obtained from the base of the skull  through the vertex without intravenous contrast. Multidetector CT imaging of the head and neck was performed using the standard protocol during bolus administration of intravenous contrast. Multiplanar CT image reconstructions and MIPs were obtained to evaluate the vascular anatomy. Carotid stenosis measurements (when applicable) are obtained utilizing NASCET criteria, using the distal internal carotid diameter as the denominator. CONTRAST:  37mL OMNIPAQUE IOHEXOL 350 MG/ML SOLN COMPARISON:  Same day head CT FINDINGS: CTA NECK Aortic arch: Great vessel origins are patent. Right carotid system: No evidence of hemodynamically significant stenosis or dissection. Left carotid system: No evidence of hemodynamically significant stenosis or dissection. Vertebral arteries:Codominant. No evidence of significant stenosis or dissection. Skeleton: Degenerative changes of the cervical spine, greatest at C5-C6 where there is disc height loss, endplate sclerosis and posterior disc osteophyte complex. There is osseous fusion across the C7-T1 vertebral bodies, possibly congenital. Other neck: No evidence of mass or suspicious adenopathy. CTA HEAD Anterior circulation: No evidence of large vessel occlusion, proximal hemodynamically significant stenosis or aneurysm. Mild calcific atherosclerosis of bilateral cavernous carotid arteries. Posterior circulation: No evidence of large vessel occlusion, proximal hemodynamically significant stenosis or aneurysm. Mild stenosis of the intradural left vertebral artery. wMildly dysplastic basilar tip without discrete aneurysm. Venous sinuses: Within the limitation of arterial timing, no evidence of dural sinus thrombosis. Small left transverse sinus. IMPRESSION: No evidence of large vessel occlusion or proximal hemodynamically significant stenosis in the head or neck. Findings were discussed with Dr. Theda Sers via telephone on 01/06/2021 at 5:00 p.m. Electronically Signed   By: Margaretha Sheffield MD    On: 01/06/2021 18:06   CT ANGIO NECK CODE STROKE  Result Date: 01/06/2021 CLINICAL DATA:  Stroke/TIA, assess extracranial arteries. EXAM: CT HEAD WITHOUT CONTRAST CT ANGIOGRAPHY OF THE HEAD AND NECK TECHNIQUE: Contiguous axial images were obtained from the base of the skull through the vertex without intravenous contrast. Multidetector CT imaging of the head and neck was performed using the standard protocol during bolus administration of intravenous contrast. Multiplanar CT image reconstructions and MIPs were obtained to evaluate the vascular anatomy. Carotid stenosis measurements (when applicable) are obtained utilizing NASCET criteria, using the distal internal carotid diameter as the denominator. CONTRAST:  61mL OMNIPAQUE IOHEXOL 350 MG/ML SOLN COMPARISON:  Same day head CT FINDINGS: CTA NECK Aortic arch: Great vessel origins are patent. Right carotid system: No evidence of hemodynamically significant stenosis or dissection. Left carotid system: No evidence of hemodynamically significant stenosis or dissection. Vertebral arteries:Codominant. No evidence of significant stenosis or dissection. Skeleton: Degenerative changes of the cervical spine, greatest at C5-C6 where there is disc height loss, endplate sclerosis and posterior disc osteophyte complex. There is osseous fusion across the C7-T1 vertebral bodies, possibly congenital.  Other neck: No evidence of mass or suspicious adenopathy. CTA HEAD Anterior circulation: No evidence of large vessel occlusion, proximal hemodynamically significant stenosis or aneurysm. Mild calcific atherosclerosis of bilateral cavernous carotid arteries. Posterior circulation: No evidence of large vessel occlusion, proximal hemodynamically significant stenosis or aneurysm. Mild stenosis of the intradural left vertebral artery. wMildly dysplastic basilar tip without discrete aneurysm. Venous sinuses: Within the limitation of arterial timing, no evidence of dural sinus thrombosis.  Small left transverse sinus. IMPRESSION: No evidence of large vessel occlusion or proximal hemodynamically significant stenosis in the head or neck. Findings were discussed with Dr. Theda Sers via telephone on 01/06/2021 at 5:00 p.m. Electronically Signed   By: Margaretha Sheffield MD   On: 01/06/2021 18:06    Medications:   .  stroke: mapping our early stages of recovery book   Does not apply Once  . aspirin EC  81 mg Oral Daily  . clopidogrel  75 mg Oral Daily  . enoxaparin (LOVENOX) injection  40 mg Subcutaneous Q24H  . levothyroxine  75 mcg Oral QAC breakfast  . simvastatin  20 mg Oral QHS   Continuous Infusions:   LOS: 0 days   Jacquelynn Cree, MD  [facility]   [facility] How to contact the [facility] Attending or Consulting provider [place] or covering provider during after hours [place], for this patient?  1. Check the care team in [facility] and look for a) attending/consulting [facility] provider listed and b) the [facility] team listed 2. Log into [email or web address] and use [place]'s universal password to access. If you do not have the password, please contact the hospital operator. 3. Locate the [facility] provider you are looking for under [facility] and page to a number that you can be directly reached. 4. If you still have difficulty reaching the provider, please page the [facility] (Director on Call) for the Hospitalists listed on amion for assistance.  01/07/2021, 2:05 PM

## 2021-01-07 NOTE — ED Notes (Signed)
Pt vomiting, sweating.  MD at bedside.  Stating to give morphine and zofran.

## 2021-01-07 NOTE — ED Notes (Signed)
Requested neurology to update family.

## 2021-01-07 NOTE — Consult Note (Signed)
Physical Medicine and Rehabilitation Consult   Reason for Consult: Stroke with functional deficits.  Referring Physician: Dr. Rockne Menghini   HPI: Crystal Trevino is a 80 y.o. LH-female with history of HTN, breast cancer s/p lumpectomy/XRT who was presented to ED on 01/06/21 with right facial droop and slurred/confused speech. CT head negative. CTA head/neck  Negative for LVO or stenosis. MRI brain showed acute 3.5 cm infarct in left frontal operculum with mils swelling c/w distal L-MCA occlusion and 2nd areas of infarct on left parietal lobe. Neurology recommended DAPT X 21 days followed by ASA alone.  TEE/loop to work up stroke felt to be due to due to unknown source --question embolic. Patient with resultant aphasia, delayed processing and requires verbal/tactile cues to sequence, decreased balance, unsteady gait and visual impairments noted by OT. CIR recommended due to functional decline.   Pt says she feels fine, in spite of not opening eyes unless directly commanded to do so- then closes them again.  Doesn't recognize balance, cognitive deficits. Admits LBM 2 days ago, but is normal for her.   Review of Systems  Constitutional: Negative for chills and fever.  HENT: Negative for hearing loss and tinnitus.   Eyes: Negative for blurred vision and double vision.  Respiratory: Negative for cough and shortness of breath.   Cardiovascular: Negative for chest pain and palpitations.  Gastrointestinal: Positive for nausea (better with meds). Negative for abdominal pain, constipation and heartburn.  Genitourinary: Negative for dysuria and urgency.       Gets up twice at nights--wears a pad  Musculoskeletal: Negative for back pain, myalgias and neck pain.  Skin: Negative for rash.  Neurological: Negative for dizziness and headaches.  All other systems reviewed and are negative.    Past Medical History:  Diagnosis Date  . Arthritis   . Breast CA ([place])    right breast cancer  . Cancer  ([place])    HAD RIGHT BREAST BX --GOING [facility] 12/30/2017  . Constipation 08/13/2011  . Dizziness 08/13/2011  . Family history of adverse reaction to anesthesia    pt mother had PONV  . Family history of breast cancer   . Genetic testing 01/28/2018   Multi-Cancer panel (83 genes) @ [facility] - No pathogenic mutations detected  . GERD (gastroesophageal reflux disease)   . Heart murmur    "SLIGHT HEART MURMUR"  WAS PICKED UP AS AN ADULT  . Hyperlipidemia   . Hypertension   . Hypothyroidism   . Osteoarthritis of knee    Right  . Peripheral vascular disease ([place])   . Reflux   . Splenic artery aneurysm ([facility])    stable 1.5cm by 08/2013 CT; no further surveillance recommended  . Weight loss     Past Surgical History:  Procedure Laterality Date  . ABDOMINAL HYSTERECTOMY    . BACK SURGERY    . BREAST LUMPECTOMY WITH RADIOACTIVE SEED LOCALIZATION Right 02/19/2018   Procedure: BREAST LUMPECTOMY WITH RADIOACTIVE SEED LOCALIZATION. RIGHT;  Surgeon: Alphonsa Overall, MD;  Location: [facility];  Service: General;  Laterality: Right;  . BREAST SURGERY    . CHOLECYSTECTOMY    . KNEE SURGERY    . LUMBAR LAMINECTOMY    . TOTAL KNEE ARTHROPLASTY Right 01/04/2018  . TOTAL KNEE ARTHROPLASTY Right 01/04/2018   Procedure: TOTAL KNEE ARTHROPLASTY;  Surgeon: Vickey Huger, MD;  Location: [facility];  Service: Orthopedics;  Laterality: Right;  Marland Kitchen VAGINAL PROLAPSE REPAIR     2015  DONE IN [place]  . VAGINAL PROLAPSE REPAIR  05/20/2018    Family History  Problem Relation Age of Onset  . Heart disease Mother   . Breast cancer Mother 67       deceased 32; TAH/BSO (age?)  . Heart disease Father   . Breast cancer Daughter        dx 52s; currenlty 7; Neg BRCA1/BRCA2  . Breast cancer Maternal Aunt 44       deceased 9    Social History:  Lives with a friend. Independent without AD. Retired from [employer]. She reports that she has never smoked. She has never used smokeless tobacco. She reports that  she does not drink alcohol and does not use drugs.    Allergies  Allergen Reactions  . Penicillins Nausea And Vomiting, Rash and Other (See Comments)    PATIENT HAS HAD A PCN REACTION WITH IMMEDIATE RASH, FACIAL/TONGUE/THROAT SWELLING, SOB, OR LIGHTHEADEDNESS WITH HYPOTENSION:  #  #  #  YES  #  #  #   Has patient had a PCN reaction causing severe rash involving mucus membranes or skin necrosis: No Has patient had a PCN reaction that required hospitalization: No Has patient had a PCN reaction occurring within the last 10 years: No    Medications Prior to Admission  Medication Sig Dispense Refill  . aspirin EC 81 MG tablet Take 81 mg by mouth daily.    . carvedilol (COREG) 12.5 MG tablet Take 12.5 mg by mouth 2 (two) times daily with a meal.    . fluticasone (FLONASE) 50 MCG/ACT nasal spray Place 1 spray into both nostrils daily as needed for allergies or rhinitis.    Marland Kitchen gabapentin (NEURONTIN) 300 MG capsule Take 1 capsule (300 mg total) by mouth at bedtime. 90 capsule 4  . levothyroxine (SYNTHROID, LEVOTHROID) 75 MCG tablet Take 75 mcg by mouth daily before breakfast.    . Multiple Vitamins-Minerals (CENTRUM SILVER) tablet Take 1 tablet by mouth daily.    . simvastatin (ZOCOR) 20 MG tablet Take 20 mg by mouth at bedtime.    . tamoxifen (NOLVADEX) 20 MG tablet Take 1 tablet (20 mg total) by mouth daily. 90 tablet 4  . telmisartan (MICARDIS) 20 MG tablet Take 20 mg by mouth daily.    Marland Kitchen venlafaxine (EFFEXOR) 75 MG tablet Take 1 tablet (75 mg total) by mouth 2 (two) times daily. 120 tablet 6    Home: Home Living Family/patient expects to be discharged to:: Private residence Living Arrangements: Non-relatives/Friends Available Help at Discharge: Family,Available PRN/intermittently,Friend(s) Type of Home: House Home Access: Stairs to enter [employer] of Steps: 5 Entrance Stairs-Rails: None Home Layout: Two level Alternate Level Stairs-Number of Steps: 13 Alternate Level  Stairs-Rails: Left Bathroom Shower/Tub: Chiropodist: Standard Home Equipment: Environmental consultant - 2 wheels,Cane - single point  Lives With: Friend(s)  Functional History: Prior Function Level of Independence: Independent Comments: Cooking, cleaning, still driving and lives with a friend Functional Status:  Mobility: Bed Mobility Overal bed mobility: Needs Assistance Bed Mobility: Supine to Sit,Sit to Supine Supine to sit: Min assist Sit to supine: Min assist General bed mobility comments: Assist for trunk and guidance of LB Transfers Overall transfer level: Needs assistance Equipment used: 2 person hand held assist Transfers: Sit to/from Stand Sit to Stand: Min assist,+2 physical assistance,+2 safety/equipment Stand pivot transfers: Min assist General transfer comment: minA +2 for stability to stand at EOB. Ambulation/Gait Ambulation/Gait assistance: Min assist,+2 physical assistance,+2 safety/equipment Gait Distance (Feet): 10  Feet Assistive device: 2 person hand held assist,1 person hand held assist Gait Pattern/deviations: Step-through pattern,Decreased stride length General Gait Details: Pt with difficulty sequencing to perform turns and required multimodal cues for sequencing. Difficulty distinguishing L from R. Min A for steadying. Gait velocity: Decreased    ADL: ADL Overall ADL's : Needs assistance/impaired Eating/Feeding: Modified independent,Sitting Grooming: Min guard,Standing Upper Body Bathing: Minimal assistance,Sitting,Standing Lower Body Bathing: Moderate assistance,Sitting/lateral leans,Sit to/from stand Upper Body Dressing : Minimal assistance,Sitting Lower Body Dressing: Moderate assistance,Sit to/from stand,Cueing for safety,Cueing for sequencing Toilet Transfer: Minimal assistance,+2 for physical assistance,+2 for safety/equipment,Ambulation,BSC Toileting- Clothing Manipulation and Hygiene: Moderate assistance,Sitting/lateral lean,Cueing for  safety,Cueing for sequencing Functional mobility during ADLs: Minimal assistance,+2 for physical assistance,+2 for safety/equipment,Cueing for safety,Cueing for sequencing General ADL Comments: Pt limited by decreased strength, decreased ability to care for self, decreased cognition and decreased visual attention and needs to be further assessed.  Cognition: Cognition Overall Cognitive Status: Impaired/Different from baseline Arousal/Alertness: Awake/alert Orientation Level: Oriented to person,Oriented to place,Disoriented to time Attention: Selective Selective Attention: Impaired Selective Attention Impairment: Verbal basic Cognition Arousal/Alertness: Lethargic Behavior During Therapy: WFL for tasks assessed/performed Overall Cognitive Status: Impaired/Different from baseline Area of Impairment: Following commands,Safety/judgement,Awareness,Problem solving Orientation Level: Disoriented to,Situation,Time Current Attention Level: Selective Memory: Decreased short-term memory Following Commands: Follows one step commands inconsistently,Follows one step commands with increased time Safety/Judgement: Decreased awareness of safety,Decreased awareness of deficits Awareness: Emergent Problem Solving: Decreased initiation,Difficulty sequencing,Requires verbal cues,Requires tactile cues,Slow processing General Comments: Pt requiring increased assist for cognition. Difficulty following multi step commands to perform Pt requiring 1 step commands with multimodal cues to complete a turn, walking around bed and unable to distinguish R from L.  Blood pressure (!) 161/67, pulse 68, temperature 97.7 F (36.5 C), temperature source Oral, resp. rate 16, height $RemoveBe'5\' 6"'WodJTColu$  (1.676 m), weight 78.8 kg, SpO2 100 %. Physical Exam Vitals and nursing note reviewed. Exam conducted with a chaperone present.  Constitutional:      Comments: Kept eyes closed for most of the exam.     Older woman in bed- has on  eyeglasses, but didn't open eyes unless asked, then closed immediately, daughter at bedside, appears upset, but won't say why  HENT:     Head: Normocephalic and atraumatic.     Comments: R facial droop Tongue midline    Right Ear: External ear normal.     Left Ear: External ear normal.     Nose: Nose normal. No congestion.     Mouth/Throat:     Mouth: Mucous membranes are dry.     Pharynx: Oropharynx is clear. No oropharyngeal exudate.  Eyes:     Comments: L gaze preference- unable to completely look R when asked R lid lag; no nystagmus seen  Cardiovascular:     Rate and Rhythm: Normal rate and regular rhythm.     Heart sounds: Normal heart sounds. No murmur heard. No gallop.   Pulmonary:     Comments: CTA B/L- no W/R/R- good air movement  Abdominal:     Comments: Soft, NT, ND, (+)BS - hypoactive  Musculoskeletal:     Cervical back: Normal range of motion and neck supple.     Comments: RUE 5-/5- just slightly less than L side in prox to distal LUE 5/5- very strong RLE- 5-/5 HF, KE, DF and PF LLE- 5/5 in same muscles  Skin:    General: Skin is warm and dry.  Neurological:     Mental Status: She is alert.  Comments: Right facial weakness without dysarthria. Keep eyes closed but denies HA/diplopia.  Able to follow simple motor commands--slower on the left.   Finger to nose slower and searching on R slightly Neglect - mild- noted on R Says intact to light touch, but doesn't respond to R side the same Divergent naming impaired  Psychiatric:     Comments: Flat, unhappy     Results for orders placed or performed during the hospital encounter of 01/06/21 (from the past 24 hour(s))  Basic metabolic panel     Status: Abnormal   Collection Time: 01/08/21 12:44 AM  Result Value Ref Range   Sodium 138 135 - 145 mmol/L   Potassium 4.0 3.5 - 5.1 mmol/L   Chloride 103 98 - 111 mmol/L   CO2 25 22 - 32 mmol/L   Glucose, Bld 126 (H) 70 - 99 mg/dL   BUN 12 8 - 23 mg/dL    Creatinine, Ser 0.80 0.44 - 1.00 mg/dL   Calcium 8.7 (L) 8.9 - 10.3 mg/dL   GFR, Estimated >60 >60 mL/min   Anion gap 10 5 - 15   CT HEAD WO CONTRAST  Result Date: 01/07/2021 CLINICAL DATA:  Stroke follow-up EXAM: CT HEAD WITHOUT CONTRAST TECHNIQUE: Contiguous axial images were obtained from the base of the skull through the vertex without intravenous contrast. COMPARISON:  CT head and MRI head 01/06/2021 FINDINGS: Brain: 3.5 cm hypodensity left frontal lobe compatible with acute infarct. This shows restricted diffusion on MRI. Mild local mass-effect. No midline shift. No acute hemorrhage. Ventricle size normal.  No other area of acute infarct. Vascular: Negative for hyperdense vessel Skull: Negative Sinuses/Orbits: Mild mucosal edema left maxillary sinus otherwise clear sinuses. Bilateral cataract extraction Other: None IMPRESSION: Acute infarct left frontal lobe with developing hypodensity but no acute hemorrhage. No midline shift. Electronically Signed   By: Franchot Gallo M.D.   On: 01/07/2021 13:23   MR BRAIN WO CONTRAST  Result Date: 01/06/2021 CLINICAL DATA:  Hypertension.  TIA presentation. EXAM: MRI HEAD WITHOUT CONTRAST TECHNIQUE: Multiplanar, multiecho pulse sequences of the brain and surrounding structures were obtained without intravenous contrast. COMPARISON:  CT studies earlier same day FINDINGS: Brain: Diffusion imaging shows a 3.5 cm region of acute infarction in the left frontal operculum. Mild swelling but no evidence of hemorrhage. Second separate area of acute infarction posterior to that in the left parietal lobe, considerably smaller. No other vascular territory insult. Brainstem and cerebellum are normal. Cerebral hemispheres elsewhere not show evidence of small-vessel disease or other large vessel infarction. No mass, hydrocephalus or extra-axial collection. Vascular: Major vessels at the base of the brain show flow. Skull and upper cervical spine: Negative Sinuses/Orbits:  Clear/normal Other: None IMPRESSION: 1. 3.5 cm region of acute infarction in the left frontal operculum. Mild swelling but no hemorrhage. Findings consistent with distal vessel left MCA occlusion, at least M3. 2. Second separate area of acute infarction posterior to that in the left parietal lobe, considerably smaller. This is also consistent with a distal branch vessel left MCA occlusion. Electronically Signed   By: Nelson Chimes M.D.   On: 01/06/2021 22:08   ECHOCARDIOGRAM COMPLETE  Result Date: 01/07/2021    ECHOCARDIOGRAM REPORT   Patient Name:   Crystal Trevino Date of Exam: 01/07/2021 Medical Rec #:  [id number]        Height:       66.0 in Accession #:    [id number]       Weight:  173.7 lb Date of Birth:  [birth year]         BSA:          1.884 m Patient Age:    46 years         BP:           129/100 mmHg Patient Gender: F                HR:           77 bpm. Exam Location:  Inpatient Procedure: 2D Echo, 3D Echo, Cardiac Doppler and Color Doppler Indications:    TIA  History:        Patient has no prior history of Echocardiogram examinations.                 Risk Factors:Hypertension and Dyslipidemia. Breast cancer.  Sonographer:    Roseanna Rainbow RDCS Referring Phys: [id number] [place]  Sonographer Comments: Suboptimal parasternal window. IMPRESSIONS  1. Left ventricular ejection fraction, by estimation, is 60 to 65%. The left ventricle has normal function. The left ventricle has no regional wall motion abnormalities. There is mild left ventricular hypertrophy. Left ventricular diastolic parameters are consistent with Grade II diastolic dysfunction (pseudonormalization).  2. Right ventricular systolic function is normal. The right ventricular size is normal. There is normal pulmonary artery systolic pressure. The estimated right ventricular systolic pressure is 84.6 mmHg.  3. The mitral valve is normal in structure. Mild mitral valve regurgitation. No evidence of mitral stenosis.  4. The aortic valve is  tricuspid. Aortic valve regurgitation is mild to moderate. Mild aortic valve sclerosis is present, with no evidence of aortic valve stenosis.  5. The inferior vena cava is normal in size with greater than 50% respiratory variability, suggesting right atrial pressure of 3 mmHg. FINDINGS  Left Ventricle: Left ventricular ejection fraction, by estimation, is 60 to 65%. The left ventricle has normal function. The left ventricle has no regional wall motion abnormalities. The left ventricular internal cavity size was normal in size. There is  mild left ventricular hypertrophy. Left ventricular diastolic parameters are consistent with Grade II diastolic dysfunction (pseudonormalization). Right Ventricle: The right ventricular size is normal. No increase in right ventricular wall thickness. Right ventricular systolic function is normal. There is normal pulmonary artery systolic pressure. The tricuspid regurgitant velocity is 2.46 m/s, and  with an assumed right atrial pressure of 3 mmHg, the estimated right ventricular systolic pressure is 96.2 mmHg. Left Atrium: Left atrial size was normal in size. Right Atrium: Right atrial size was normal in size. Pericardium: There is no evidence of pericardial effusion. Mitral Valve: The mitral valve is normal in structure. There is mild calcification of the mitral valve leaflet(s). Mild mitral annular calcification. Mild mitral valve regurgitation. No evidence of mitral valve stenosis. MV peak gradient, 8.6 mmHg. The mean mitral valve gradient is 3.0 mmHg. Tricuspid Valve: The tricuspid valve is normal in structure. Tricuspid valve regurgitation is trivial. Aortic Valve: The aortic valve is tricuspid. Aortic valve regurgitation is mild to moderate. Aortic regurgitation PHT measures 382 msec. Mild aortic valve sclerosis is present, with no evidence of aortic valve stenosis. Pulmonic Valve: The pulmonic valve was normal in structure. Pulmonic valve regurgitation is trivial. Aorta: The  aortic root is normal in size and structure. Venous: The inferior vena cava is normal in size with greater than 50% respiratory variability, suggesting right atrial pressure of 3 mmHg. IAS/Shunts: No atrial level shunt detected by color flow Doppler.  LEFT VENTRICLE  PLAX 2D LVIDd:         3.60 cm      Diastology LVIDs:         2.50 cm      LV e' medial:    6.96 cm/s LV PW:         1.30 cm      LV E/e' medial:  22.3 LV IVS:        1.10 cm      LV e' lateral:   9.79 cm/s LVOT diam:     1.70 cm      LV E/e' lateral: 15.8 LV SV:         45 LV SV Index:   24 LVOT Area:     2.27 cm                              3D Volume EF: LV Volumes (MOD)            3D EF:        61 % LV vol d, MOD A2C: 101.0 ml LV EDV:       143 ml LV vol d, MOD A4C: 66.6 ml  LV ESV:       56 ml LV vol s, MOD A2C: 39.5 ml  LV SV:        87 ml LV vol s, MOD A4C: 26.2 ml LV SV MOD A2C:     61.5 ml LV SV MOD A4C:     66.6 ml LV SV MOD BP:      58.4 ml RIGHT VENTRICLE             IVC RV S prime:     12.00 cm/s  IVC diam: 1.40 cm TAPSE (M-mode): 2.3 cm LEFT ATRIUM             Index       RIGHT ATRIUM           Index LA diam:        3.70 cm 1.96 cm/m  RA Area:     13.70 cm LA Vol (A2C):   29.9 ml 15.87 ml/m RA Volume:   27.60 ml  14.65 ml/m LA Vol (A4C):   41.0 ml 21.77 ml/m LA Biplane Vol: 35.7 ml 18.95 ml/m  AORTIC VALVE LVOT Vmax:   89.40 cm/s LVOT Vmean:  57.200 cm/s LVOT VTI:    0.199 m AI PHT:      382 msec  AORTA Ao Asc diam: 3.60 cm MITRAL VALVE                TRICUSPID VALVE MV Area (PHT): 3.77 cm     TR Peak grad:   24.2 mmHg MV Area VTI:   1.14 cm     TR Vmax:        246.00 cm/s MV Peak grad:  8.6 mmHg MV Mean grad:  3.0 mmHg     SHUNTS MV Vmax:       1.47 m/s     Systemic VTI:  0.20 m MV Vmean:      76.5 cm/s    Systemic Diam: 1.70 cm MV Decel Time: 201 msec MV E velocity: 155.00 cm/s MV A velocity: 104.00 cm/s MV E/A ratio:  1.49 Loralie Champagne MD Electronically signed by Loralie Champagne MD Signature Date/Time: 01/07/2021/2:37:34 PM    Final     CT HEAD CODE STROKE WO CONTRAST  Result Date: 01/06/2021 CLINICAL  DATA:  Code stroke. Neuro deficit, acute stroke suspected. Slurred speech with right-sided facial droop. EXAM: CT HEAD WITHOUT CONTRAST TECHNIQUE: Contiguous axial images were obtained from the base of the skull through the vertex without intravenous contrast. COMPARISON:  None. FINDINGS: Brain: No evidence of acute large vascular territory infarction, hemorrhage, hydrocephalus, extra-axial collection or mass lesion/mass effect. Mild patchy white matter hypoattenuation, most likely related to chronic microvascular ischemic disease. Vascular: No hyperdense vessel identified. Calcific atherosclerosis. Skull: No acute fracture. Sinuses/Orbits: No acute findings. Other: No mastoid effusions. ASPECTS [facility] Stroke Program Early CT Score) total score (0-10 with 10 being normal): 10. IMPRESSION: 1. No evidence of acute intracranial abnormality. 2. ASPECTS is 10. Code stroke imaging results were communicated on 01/06/2021 at 5:00 pm to provider Dr. Theda Sers via telephone, who verbally acknowledged these results. Electronically Signed   By: Margaretha Sheffield MD   On: 01/06/2021 17:03   VAS [place] LOWER EXTREMITY VENOUS (DVT)  Result Date: 01/07/2021  Lower Venous DVT Study Indications: Stroke.  Comparison Study: No prior study on file Performing Technologist: Sharion Dove RVS  Examination Guidelines: A complete evaluation includes B-mode imaging, spectral Doppler, color Doppler, and power Doppler as needed of all accessible portions of each vessel. Bilateral testing is considered an integral part of a complete examination. Limited examinations for reoccurring indications may be performed as noted. The reflux portion of the exam is performed with the patient in reverse Trendelenburg.  +---------+---------------+---------+-----------+----------+--------------+ RIGHT    CompressibilityPhasicitySpontaneityPropertiesThrombus Aging  +---------+---------------+---------+-----------+----------+--------------+ CFV      Full           Yes      Yes                                 +---------+---------------+---------+-----------+----------+--------------+ SFJ      Full                                                        +---------+---------------+---------+-----------+----------+--------------+ FV Prox  Full                                                        +---------+---------------+---------+-----------+----------+--------------+ FV Mid   Full                                                        +---------+---------------+---------+-----------+----------+--------------+ FV DistalFull                                                        +---------+---------------+---------+-----------+----------+--------------+ PFV      Full                                                        +---------+---------------+---------+-----------+----------+--------------+  POP      Full           Yes      Yes                                 +---------+---------------+---------+-----------+----------+--------------+ PTV      Full                                                        +---------+---------------+---------+-----------+----------+--------------+ PERO     Full                                                        +---------+---------------+---------+-----------+----------+--------------+   +---------+---------------+---------+-----------+----------+--------------+ LEFT     CompressibilityPhasicitySpontaneityPropertiesThrombus Aging +---------+---------------+---------+-----------+----------+--------------+ CFV      Full           Yes      Yes                                 +---------+---------------+---------+-----------+----------+--------------+ SFJ      Full                                                         +---------+---------------+---------+-----------+----------+--------------+ FV Prox  Full                                                        +---------+---------------+---------+-----------+----------+--------------+ FV Mid   Full                                                        +---------+---------------+---------+-----------+----------+--------------+ FV DistalFull                                                        +---------+---------------+---------+-----------+----------+--------------+ PFV      Full                                                        +---------+---------------+---------+-----------+----------+--------------+ POP      Full           Yes      Yes                                 +---------+---------------+---------+-----------+----------+--------------+  PTV      Full                                                        +---------+---------------+---------+-----------+----------+--------------+ PERO     Full                                                        +---------+---------------+---------+-----------+----------+--------------+     Summary: BILATERAL: - No evidence of deep vein thrombosis seen in the lower extremities, bilaterally. -   *See table(s) above for measurements and observations. Electronically signed by Jamelle Haring on 01/07/2021 at 5:00:30 PM.    Final    CT ANGIO HEAD CODE STROKE  Result Date: 01/06/2021 CLINICAL DATA:  Stroke/TIA, assess extracranial arteries. EXAM: CT HEAD WITHOUT CONTRAST CT ANGIOGRAPHY OF THE HEAD AND NECK TECHNIQUE: Contiguous axial images were obtained from the base of the skull through the vertex without intravenous contrast. Multidetector CT imaging of the head and neck was performed using the standard protocol during bolus administration of intravenous contrast. Multiplanar CT image reconstructions and MIPs were obtained to evaluate the vascular anatomy. Carotid stenosis  measurements (when applicable) are obtained utilizing NASCET criteria, using the distal internal carotid diameter as the denominator. CONTRAST:  10mL OMNIPAQUE IOHEXOL 350 MG/ML SOLN COMPARISON:  Same day head CT FINDINGS: CTA NECK Aortic arch: Great vessel origins are patent. Right carotid system: No evidence of hemodynamically significant stenosis or dissection. Left carotid system: No evidence of hemodynamically significant stenosis or dissection. Vertebral arteries:Codominant. No evidence of significant stenosis or dissection. Skeleton: Degenerative changes of the cervical spine, greatest at C5-C6 where there is disc height loss, endplate sclerosis and posterior disc osteophyte complex. There is osseous fusion across the C7-T1 vertebral bodies, possibly congenital. Other neck: No evidence of mass or suspicious adenopathy. CTA HEAD Anterior circulation: No evidence of large vessel occlusion, proximal hemodynamically significant stenosis or aneurysm. Mild calcific atherosclerosis of bilateral cavernous carotid arteries. Posterior circulation: No evidence of large vessel occlusion, proximal hemodynamically significant stenosis or aneurysm. Mild stenosis of the intradural left vertebral artery. wMildly dysplastic basilar tip without discrete aneurysm. Venous sinuses: Within the limitation of arterial timing, no evidence of dural sinus thrombosis. Small left transverse sinus. IMPRESSION: No evidence of large vessel occlusion or proximal hemodynamically significant stenosis in the head or neck. Findings were discussed with Dr. Theda Sers via telephone on 01/06/2021 at 5:00 p.m. Electronically Signed   By: Margaretha Sheffield MD   On: 01/06/2021 18:06   CT ANGIO NECK CODE STROKE  Result Date: 01/06/2021 CLINICAL DATA:  Stroke/TIA, assess extracranial arteries. EXAM: CT HEAD WITHOUT CONTRAST CT ANGIOGRAPHY OF THE HEAD AND NECK TECHNIQUE: Contiguous axial images were obtained from the base of the skull through the vertex  without intravenous contrast. Multidetector CT imaging of the head and neck was performed using the standard protocol during bolus administration of intravenous contrast. Multiplanar CT image reconstructions and MIPs were obtained to evaluate the vascular anatomy. Carotid stenosis measurements (when applicable) are obtained utilizing NASCET criteria, using the distal internal carotid diameter as the denominator. CONTRAST:  91mL OMNIPAQUE IOHEXOL 350 MG/ML SOLN COMPARISON:  Same day head CT FINDINGS: CTA  NECK Aortic arch: Great vessel origins are patent. Right carotid system: No evidence of hemodynamically significant stenosis or dissection. Left carotid system: No evidence of hemodynamically significant stenosis or dissection. Vertebral arteries:Codominant. No evidence of significant stenosis or dissection. Skeleton: Degenerative changes of the cervical spine, greatest at C5-C6 where there is disc height loss, endplate sclerosis and posterior disc osteophyte complex. There is osseous fusion across the C7-T1 vertebral bodies, possibly congenital. Other neck: No evidence of mass or suspicious adenopathy. CTA HEAD Anterior circulation: No evidence of large vessel occlusion, proximal hemodynamically significant stenosis or aneurysm. Mild calcific atherosclerosis of bilateral cavernous carotid arteries. Posterior circulation: No evidence of large vessel occlusion, proximal hemodynamically significant stenosis or aneurysm. Mild stenosis of the intradural left vertebral artery. wMildly dysplastic basilar tip without discrete aneurysm. Venous sinuses: Within the limitation of arterial timing, no evidence of dural sinus thrombosis. Small left transverse sinus. IMPRESSION: No evidence of large vessel occlusion or proximal hemodynamically significant stenosis in the head or neck. Findings were discussed with Dr. Theda Sers via telephone on 01/06/2021 at 5:00 p.m. Electronically Signed   By: Margaretha Sheffield MD   On: 01/06/2021  18:06     Assessment/Plan: Diagnosis: L frontal/parietal stroke 1. Does the need for close, 24 hr/day medical supervision in concert with the patient's rehab needs make it unreasonable for this patient to be served in a less intensive setting? Yes 2. Co-Morbidities requiring supervision/potential complications: HTN, aphasia, R hemiparesis, poor safety awareness 3. Due to bladder management, bowel management, safety, skin/wound care, disease management, medication administration, pain management and patient education, does the patient require 24 hr/day rehab nursing? Yes 4. Does the patient require coordinated care of a physician, rehab nurse, therapy disciplines of PT, OT and SLP to address physical and functional deficits in the context of the above medical diagnosis(es)? Yes Addressing deficits in the following areas: balance, endurance, locomotion, strength, transferring, bathing, dressing, feeding, grooming, toileting, cognition, speech and language 5. Can the patient actively participate in an intensive therapy program of at least 3 hrs of therapy per day at least 5 days per week? Yes 6. The potential for patient to make measurable gains while on inpatient rehab is good 7. Anticipated functional outcomes upon discharge from inpatient rehab are modified independent and supervision  with PT, modified independent and supervision with OT, modified independent and supervision with SLP. 8. Estimated rehab length of stay to reach the above functional goals is: 5-7 days 9. Anticipated discharge destination: Home 10. Overall Rehab/Functional Prognosis: good  RECOMMENDATIONS: This patient's condition is appropriate for continued rehabilitative care in the following setting: CIR Patient has agreed to participate in recommended program. Potentially Note that insurance prior authorization may be required for reimbursement for recommended care.  Comment:  1. D/w pt- she's now willing to come to inpt  rehab-  2. Will submit for insurance approval 3. Thank you for consult.    Bary Leriche, PA-C 01/08/2021

## 2021-01-08 DIAGNOSIS — E785 Hyperlipidemia, unspecified: Secondary | ICD-10-CM | POA: Diagnosis not present

## 2021-01-08 DIAGNOSIS — I639 Cerebral infarction, unspecified: Secondary | ICD-10-CM

## 2021-01-08 DIAGNOSIS — D649 Anemia, unspecified: Secondary | ICD-10-CM | POA: Diagnosis present

## 2021-01-08 DIAGNOSIS — I10 Essential (primary) hypertension: Secondary | ICD-10-CM | POA: Diagnosis not present

## 2021-01-08 LAB — BASIC METABOLIC PANEL
Anion gap: 10 (ref 5–15)
BUN: 12 mg/dL (ref 8–23)
CO2: 25 mmol/L (ref 22–32)
Calcium: 8.7 mg/dL — ABNORMAL LOW (ref 8.9–10.3)
Chloride: 103 mmol/L (ref 98–111)
Creatinine, Ser: 0.8 mg/dL (ref 0.44–1.00)
GFR, Estimated: >60 mL/min (ref >60)
Glucose, Bld: 126 mg/dL — ABNORMAL HIGH (ref 70–99)
Potassium: 4 mmol/L (ref 3.5–5.1)
Sodium: 138 mmol/L (ref 135–145)

## 2021-01-08 MED ORDER — CARVEDILOL 12.5 MG PO TABS
12.5000 mg | ORAL_TABLET | Freq: Two times a day (BID) | ORAL | Status: DC
  Administered 2021-01-08 – 2021-01-11 (×7): 12.5 mg via ORAL
  Filled 2021-01-08 (×7): qty 1

## 2021-01-08 MED ORDER — SIMVASTATIN 20 MG PO TABS
20.0000 mg | ORAL_TABLET | Freq: Every day | ORAL | Status: DC
  Administered 2021-01-08 – 2021-01-10 (×3): 20 mg via ORAL
  Filled 2021-01-08 (×3): qty 1

## 2021-01-08 MED ORDER — ROSUVASTATIN CALCIUM 20 MG PO TABS: 20.0000 mg | ORAL_TABLET | Freq: Every day | ORAL | Status: DC

## 2021-01-08 NOTE — Progress Notes (Signed)
Inpatient Rehab Admissions:  Inpatient Rehab Consult received.  I met with patient and her daughter at the bedside for rehabilitation assessment and to discuss expectations of an inpatient rehab admission.  At this time, patient is unsure whether she would be agreeable to a CIR stay, but is agreeable to start insurance authorization process.  We discussed that insurance authorization would take 24-48 hours and we could monitor progress during that time to see if she does well enough to d/c home with support from family/friends.  Pt does have good support from her friend and her family.  Will await formal consult from PM&R physician and follow up with patient/family tomorrow.    Signed: Shann Medal, PT, DPT Admissions Coordinator [phone] 01/08/21  12:15 PM

## 2021-01-08 NOTE — Progress Notes (Signed)
  EP is following along for discharge timing for consideration of loop implant.    Legrand Como [address] [place], PA-C  01/08/2021 11:49 AM

## 2021-01-08 NOTE — Evaluation (Signed)
Speech Language Pathology Evaluation Patient Details Name: Crystal Trevino MRN: [id number] DOB: 10/24/1941 Today's Date: 01/08/2021 Time: 7253-6644 SLP Time Calculation (min) (ACUTE ONLY): 20 min  Problem List:  Patient Active Problem List   Diagnosis Date Noted  . Acute CVA (cerebrovascular accident) ([place]) 01/07/2021  . Pre-diabetes 01/07/2021  . Essential hypertension 01/06/2021  . Hyperlipidemia 01/06/2021  . Hypothyroidism 01/06/2021  . Genetic testing 01/28/2018  . S/P total knee replacement 01/04/2018  . Family history of breast cancer   . 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 Aneurysm of splenic artery ([place]) 08/23/2012   Past Medical History:  Past Medical History:  Diagnosis Date  . Arthritis   . Breast CA ([place])    right breast cancer  . Cancer ([place])    HAD RIGHT BREAST BX --GOING [facility] 12/30/2017  . Constipation 08/13/2011  . Dizziness 08/13/2011  . Family history of adverse reaction to anesthesia    pt mother had PONV  . Family history of breast cancer   . Genetic testing 01/28/2018   Multi-Cancer panel (83 genes) @ [facility] - No pathogenic mutations detected  . GERD (gastroesophageal reflux disease)   . Heart murmur    "SLIGHT HEART MURMUR"  WAS PICKED UP AS AN ADULT  . Hyperlipidemia   . Hypertension   . Hypothyroidism   . Osteoarthritis of knee    Right  . Peripheral vascular disease ([place])   . Reflux   . Splenic artery aneurysm ([facility])    stable 1.5cm by 08/2013 CT; no further surveillance recommended  . Weight loss    Past Surgical History:  Past Surgical History:  Procedure Laterality Date  . ABDOMINAL HYSTERECTOMY    . BACK SURGERY    . BREAST LUMPECTOMY WITH RADIOACTIVE SEED LOCALIZATION Right 02/19/2018   Procedure: BREAST LUMPECTOMY WITH RADIOACTIVE SEED LOCALIZATION. RIGHT;  Surgeon: Alphonsa Overall, MD;  Location: [facility];  Service: General;  Laterality: Right;  . BREAST SURGERY    . CHOLECYSTECTOMY    . KNEE  SURGERY    . LUMBAR LAMINECTOMY    . TOTAL KNEE ARTHROPLASTY Right 01/04/2018  . TOTAL KNEE ARTHROPLASTY Right 01/04/2018   Procedure: TOTAL KNEE ARTHROPLASTY;  Surgeon: Vickey Huger, MD;  Location: [facility];  Service: Orthopedics;  Laterality: Right;  Marland Kitchen VAGINAL PROLAPSE REPAIR     2015   DONE IN [place]  . VAGINAL PROLAPSE REPAIR  05/20/2018   HPI:  Pt is a 80 yo female admitted secondary to slurred speech. 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 PMHx: R breast cancer s/p lumpectomy and HTN.   Assessment / Plan / Recommendation Clinical Impression  Pt presents with a mild aphasia, mild cognitive impairment, and right inattention/neglect.  She is oriented to person/place/situation but not all elements of time.  She demonstrated difficulty following multistep commands.  Repetition, fluency, naming to confrontation and responsive naming were WNL, but divergent naming was impaired. Pt omitted information from right side of page during oral reading without awareness.  Speech was intelligible without evidence of dysarthria. Pt was independent PTA and highly intelligent per her dtr, Crystal Trevino.  Recommend SLP f/u in this venue of care and the next to address higher level comprehension, expression, and reading. Pt/dtr agree with plan.    SLP Assessment  SLP Recommendation/Assessment: Patient needs continued Speech Lanaguage Pathology Services SLP Visit Diagnosis: Aphasia (R47.01)  Follow Up Recommendations  Inpatient Rehab    Frequency and Duration min 2x/week  1 week      SLP Evaluation Cognition  Overall Cognitive Status: Impaired/Different from baseline Arousal/Alertness: Awake/alert Orientation Level: Oriented to person;Oriented to place;Disoriented to time Attention: Selective Selective Attention: Impaired Selective Attention Impairment: Verbal basic       Comprehension  Auditory Comprehension Yes/No  Questions: Within Functional Limits Commands: Impaired Multistep Basic Commands: 50-74% accurate Conversation: Simple Visual Recognition/Discrimination Discrimination: Within Function Limits Reading Comprehension Reading Status: Impaired Paragraph Level: Impaired Interfering Components: Right neglect/inattention    Expression Expression Primary Mode of Expression: Verbal Verbal Expression Overall Verbal Expression: Impaired Initiation: No impairment Level of Generative/Spontaneous Verbalization: Conversation Repetition: No impairment Naming: Impairment Responsive: 76-100% accurate Confrontation: Within functional limits Divergent: Other (comment) (9 animals in  60 seconds) Pragmatics: No impairment Written Expression Dominant Hand: Left Written Expression: Not tested   Oral / Motor  Oral Motor/Sensory Function Overall Oral Motor/Sensory Function: Mild impairment Facial Symmetry: Abnormal symmetry right;Suspected CN VII (facial) dysfunction Facial Strength: Reduced right;Suspected CN VII (facial) dysfunction Lingual Symmetry: Within Functional Limits Motor Speech Overall Motor Speech: Appears within functional limits for tasks assessed   GO                    Juan Quam Laurice 01/08/2021, 9:57 AM  Estill Bamberg L. Tivis Ringer, [place] Office number [phone] Pager [phone]

## 2021-01-08 NOTE — Progress Notes (Signed)
Physical Therapy Treatment Patient Details Name: GEORGENE KOPPER MRN: [id number] DOB: 1941-04-09 Today's Date: 01/08/2021    History of Present Illness Pt is a 80 yo female admitted secondary to slurred speech. 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 PMHx: R breast cancer s/p lumpectomy and HTN.    PT Comments    Patient progressing well towards PT goals. Pt with flat affect and lethargic this morning but A&Ox4. Tolerated gait training with Min guard-Min A for balance/safety, noted to have drifting and increased forward momentum at times. Pt with difficulty following multi step commands/instructions. Unable to appropriately participate in 5xSTS needing cues after each stand to perform task. Pt with poor awareness of safety/deficits reporting she feels normal despite needing assist for balance. Continue to recommend CIR as pt with cognitive and balance deficits impacting mobility and safety. Will follow.     Follow Up Recommendations  CIR     Equipment Recommendations  3in1 (PT)    Recommendations for Other Services       Precautions / Restrictions Precautions Precautions: Fall Restrictions Weight Bearing Restrictions: No    Mobility  Bed Mobility Overal bed mobility: Needs Assistance Bed Mobility: Supine to Sit     Supine to sit: Supervision;HOB elevated     General bed mobility comments: Supervision for safety, use of rails.  Transfers Overall transfer level: Needs assistance Equipment used: None Transfers: Sit to/from Stand Sit to Stand: Min guard;Min assist         General transfer comment: Min guard for safety. Min A to steady once upright, Stood from [employer], from toilet x1, transferred to chair post ambulation.  Ambulation/Gait Ambulation/Gait assistance: Min assist;Min guard Gait Distance (Feet): 100 Feet (+25') Assistive device: 1 person hand held assist;None Gait  Pattern/deviations: Step-through pattern;Decreased stride length;Drifts right/left Gait velocity: Decreased   General Gait Details: Slow, mildly unsteady gait with HHA initially progressing to reaching for rails, increased momentum forward, Min A for balance at times.   Stairs             Wheelchair Mobility    Modified Rankin (Stroke Patients Only) Modified Rankin (Stroke Patients Only) Pre-Morbid Rankin Score: No symptoms Modified Rankin: Moderately severe disability     Balance Overall balance assessment: Needs assistance Sitting-balance support: Feet supported;No upper extremity supported Sitting balance-Leahy Scale: Good     Standing balance support: During functional activity Standing balance-Leahy Scale: Fair Standing balance comment: Able to stand at sink and wash hands without difficulty or LOB, close min guard for safety.                            Cognition Arousal/Alertness: Lethargic Behavior During Therapy: Flat affect Overall Cognitive Status: Impaired/Different from baseline Area of Impairment: Following commands;Safety/judgement;Awareness;Problem solving                       Following Commands: Follows one step commands inconsistently;Follows one step commands with increased time Safety/Judgement: Decreased awareness of safety;Decreased awareness of deficits Awareness: Emergent Problem Solving: Decreased initiation;Requires verbal cues General Comments: Pt requiring increased assist for cognition. Difficulty following multi step commands to perform 5x STS test, requiring frequent cues for instructions as pt stopping after each rep. Flat affect. A&Ox4 today. Reports feeling like normal despite needing Min A at times for gait, poor awareness of deficits/safety.      Exercises  General Comments General comments (skin integrity, edema, etc.): Daughter present at end of session; pt has difficulty with directions and following  multi step instructions.      Pertinent Vitals/Pain Pain Assessment: No/denies pain    Home Living     Available Help at Discharge: Family;Available PRN/intermittently;Friend(s) Type of Home: House              Prior Function            PT Goals (current goals can now be found in the care plan section) Progress towards PT goals: Progressing toward goals    Frequency    Min 4X/week      PT Plan Current plan remains appropriate    Co-evaluation              AM-PAC PT "6 Clicks" Mobility   Outcome Measure  Help needed turning from your back to your side while in a flat bed without using bedrails?: None Help needed moving from lying on your back to sitting on the side of a flat bed without using bedrails?: A Little Help needed moving to and from a bed to a chair (including a wheelchair)?: A Little Help needed standing up from a chair using your arms (e.g., wheelchair or bedside chair)?: A Little Help needed to walk in hospital room?: A Little Help needed climbing 3-5 steps with a railing? : A Little 6 Click Score: 19    End of Session Equipment Utilized During Treatment: Gait belt Activity Tolerance: Patient tolerated treatment well Patient left: in chair;with call bell/phone within reach;with chair alarm set;with family/visitor present Nurse Communication: Mobility status PT Visit Diagnosis: Unsteadiness on feet (R26.81);Other symptoms and signs involving the nervous system (X73.532)     Time: 9924-2683 PT Time Calculation (min) (ACUTE ONLY): 18 min  Charges:  $Gait Training: 8-22 mins                     Marisa Severin, PT, DPT Acute Rehabilitation Services Pager [phone] Office [place] 01/08/2021, 10:59 AM

## 2021-01-08 NOTE — Progress Notes (Addendum)
STROKE TEAM PROGRESS NOTE   INTERVAL HISTORY Patient is observed at the bedside.  Daughter present at the bedside.  Pt is doing better. Daughter reports Pt still has some slurred speech. Patient denies any complaints. Vitals stable.  Patient afebrile.  On examination patient is alert and oriented. Pt still has some sensory neglect on Rt side. Rt hand weakness has improved since yesterday. Pt is on (aspirin 81 mg and Plavix 75 mg). PT recommend CIR but Pt is unsure. Pt has good support system. Request for Loop recorder placed yesterday. No Neurological interventions needed at this point. Neurology will sign off.   Vitals:   01/07/21 1750 01/07/21 1956 01/07/21 2325 01/08/21 0328  BP: (!) 161/66 (!) 175/65 140/60 (!) 162/60  Pulse: 74 66 65 63  Resp: (!) 22 18 18 18   Temp: 98.3 F (36.8 C) 97.9 F (36.6 C) 97.7 F (36.5 C) 98 F (36.7 C)  TempSrc: Oral Oral Oral Oral  SpO2:  99% 100% 99%  Weight:      Height:       CBC:  Recent Labs  Lab 01/06/21 1652 01/06/21 1654 01/07/21 0356  WBC 5.0  --  6.1  NEUTROABS 3.0  --   --   HGB 10.3* 11.2* 10.2*  HCT 32.5* 33.0* 32.3*  MCV 97.0  --  95.6  PLT 190  --  035   Basic Metabolic Panel:  Recent Labs  Lab 01/07/21 0356 01/08/21 0044  NA 139 138  K 4.4 4.0  CL 105 103  CO2 24 25  GLUCOSE 125* 126*  BUN 13 12  CREATININE 0.83 0.80  CALCIUM 8.8* 8.7*   Lipid Panel:  Recent Labs  Lab 01/07/21 0356  CHOL 154  TRIG 139  HDL 58  CHOLHDL 2.7  VLDL 28  LDLCALC 68   HgbA1c:  Recent Labs  Lab 01/07/21 0357  HGBA1C 6.0*   Urine Drug Screen: No results for input(s): LABOPIA, COCAINSCRNUR, LABBENZ, AMPHETMU, THCU, LABBARB in the last 168 hours.  Alcohol Level No results for input(s): ETH in the last 168 hours.  IMAGING past 24 hours CT HEAD WO CONTRAST  Result Date: 01/07/2021 CLINICAL DATA:  Stroke follow-up EXAM: CT HEAD WITHOUT CONTRAST TECHNIQUE: Contiguous axial images were obtained from the base of the skull through  the vertex without intravenous contrast. COMPARISON:  CT head and MRI head 01/06/2021 FINDINGS: Brain: 3.5 cm hypodensity left frontal lobe compatible with acute infarct. This shows restricted diffusion on MRI. Mild local mass-effect. No midline shift. No acute hemorrhage. Ventricle size normal.  No other area of acute infarct. Vascular: Negative for hyperdense vessel Skull: Negative Sinuses/Orbits: Mild mucosal edema left maxillary sinus otherwise clear sinuses. Bilateral cataract extraction Other: None IMPRESSION: Acute infarct left frontal lobe with developing hypodensity but no acute hemorrhage. No midline shift. Electronically Signed   By: Franchot Gallo M.D.   On: 01/07/2021 13:23   ECHOCARDIOGRAM COMPLETE  Result Date: 01/07/2021    ECHOCARDIOGRAM REPORT   Patient Name:   DODIE PARISI Date of Exam: 01/07/2021 Medical Rec #:  [id number]        Height:       66.0 in Accession #:    [id number]       Weight:       173.7 lb Date of Birth:  12-22-1940         BSA:          1.884 m Patient Age:    61 years  BP:           129/100 mmHg Patient Gender: F                HR:           77 bpm. Exam Location:  Inpatient Procedure: 2D Echo, 3D Echo, Cardiac Doppler and Color Doppler Indications:    TIA  History:        Patient has no prior history of Echocardiogram examinations.                 Risk Factors:Hypertension and Dyslipidemia. Breast cancer.  Sonographer:    Roseanna Rainbow RDCS Referring Phys: [id number] [place]  Sonographer Comments: Suboptimal parasternal window. IMPRESSIONS  1. Left ventricular ejection fraction, by estimation, is 60 to 65%. The left ventricle has normal function. The left ventricle has no regional wall motion abnormalities. There is mild left ventricular hypertrophy. Left ventricular diastolic parameters are consistent with Grade II diastolic dysfunction (pseudonormalization).  2. Right ventricular systolic function is normal. The right ventricular size is normal. There is normal  pulmonary artery systolic pressure. The estimated right ventricular systolic pressure is 08.6 mmHg.  3. The mitral valve is normal in structure. Mild mitral valve regurgitation. No evidence of mitral stenosis.  4. The aortic valve is tricuspid. Aortic valve regurgitation is mild to moderate. Mild aortic valve sclerosis is present, with no evidence of aortic valve stenosis.  5. The inferior vena cava is normal in size with greater than 50% respiratory variability, suggesting right atrial pressure of 3 mmHg. FINDINGS  Left Ventricle: Left ventricular ejection fraction, by estimation, is 60 to 65%. The left ventricle has normal function. The left ventricle has no regional wall motion abnormalities. The left ventricular internal cavity size was normal in size. There is  mild left ventricular hypertrophy. Left ventricular diastolic parameters are consistent with Grade II diastolic dysfunction (pseudonormalization). Right Ventricle: The right ventricular size is normal. No increase in right ventricular wall thickness. Right ventricular systolic function is normal. There is normal pulmonary artery systolic pressure. The tricuspid regurgitant velocity is 2.46 m/s, and  with an assumed right atrial pressure of 3 mmHg, the estimated right ventricular systolic pressure is 76.1 mmHg. Left Atrium: Left atrial size was normal in size. Right Atrium: Right atrial size was normal in size. Pericardium: There is no evidence of pericardial effusion. Mitral Valve: The mitral valve is normal in structure. There is mild calcification of the mitral valve leaflet(s). Mild mitral annular calcification. Mild mitral valve regurgitation. No evidence of mitral valve stenosis. MV peak gradient, 8.6 mmHg. The mean mitral valve gradient is 3.0 mmHg. Tricuspid Valve: The tricuspid valve is normal in structure. Tricuspid valve regurgitation is trivial. Aortic Valve: The aortic valve is tricuspid. Aortic valve regurgitation is mild to moderate. Aortic  regurgitation PHT measures 382 msec. Mild aortic valve sclerosis is present, with no evidence of aortic valve stenosis. Pulmonic Valve: The pulmonic valve was normal in structure. Pulmonic valve regurgitation is trivial. Aorta: The aortic root is normal in size and structure. Venous: The inferior vena cava is normal in size with greater than 50% respiratory variability, suggesting right atrial pressure of 3 mmHg. IAS/Shunts: No atrial level shunt detected by color flow Doppler.  LEFT VENTRICLE PLAX 2D LVIDd:         3.60 cm      Diastology LVIDs:         2.50 cm      LV e' medial:    6.96  cm/s LV PW:         1.30 cm      LV E/e' medial:  22.3 LV IVS:        1.10 cm      LV e' lateral:   9.79 cm/s LVOT diam:     1.70 cm      LV E/e' lateral: 15.8 LV SV:         45 LV SV Index:   24 LVOT Area:     2.27 cm                              3D Volume EF: LV Volumes (MOD)            3D EF:        61 % LV vol d, MOD A2C: 101.0 ml LV EDV:       143 ml LV vol d, MOD A4C: 66.6 ml  LV ESV:       56 ml LV vol s, MOD A2C: 39.5 ml  LV SV:        87 ml LV vol s, MOD A4C: 26.2 ml LV SV MOD A2C:     61.5 ml LV SV MOD A4C:     66.6 ml LV SV MOD BP:      58.4 ml RIGHT VENTRICLE             IVC RV S prime:     12.00 cm/s  IVC diam: 1.40 cm TAPSE (M-mode): 2.3 cm LEFT ATRIUM             Index       RIGHT ATRIUM           Index LA diam:        3.70 cm 1.96 cm/m  RA Area:     13.70 cm LA Vol (A2C):   29.9 ml 15.87 ml/m RA Volume:   27.60 ml  14.65 ml/m LA Vol (A4C):   41.0 ml 21.77 ml/m LA Biplane Vol: 35.7 ml 18.95 ml/m  AORTIC VALVE LVOT Vmax:   89.40 cm/s LVOT Vmean:  57.200 cm/s LVOT VTI:    0.199 m AI PHT:      382 msec  AORTA Ao Asc diam: 3.60 cm MITRAL VALVE                TRICUSPID VALVE MV Area (PHT): 3.77 cm     TR Peak grad:   24.2 mmHg MV Area VTI:   1.14 cm     TR Vmax:        246.00 cm/s MV Peak grad:  8.6 mmHg MV Mean grad:  3.0 mmHg     SHUNTS MV Vmax:       1.47 m/s     Systemic VTI:  0.20 m MV Vmean:      76.5 cm/s     Systemic Diam: 1.70 cm MV Decel Time: 201 msec MV E velocity: 155.00 cm/s MV A velocity: 104.00 cm/s MV E/A ratio:  1.49 Loralie Champagne MD Electronically signed by Loralie Champagne MD Signature Date/Time: 01/07/2021/2:37:34 PM    Final    VAS [place] LOWER EXTREMITY VENOUS (DVT)  Result Date: 01/07/2021  Lower Venous DVT Study Indications: Stroke.  Comparison Study: No prior study on file Performing Technologist: Sharion Dove RVS  Examination Guidelines: A complete evaluation includes B-mode imaging, spectral Doppler, color Doppler, and power Doppler as needed of all accessible portions of  each vessel. Bilateral testing is considered an integral part of a complete examination. Limited examinations for reoccurring indications may be performed as noted. The reflux portion of the exam is performed with the patient in reverse Trendelenburg.  +---------+---------------+---------+-----------+----------+--------------+ RIGHT    CompressibilityPhasicitySpontaneityPropertiesThrombus Aging +---------+---------------+---------+-----------+----------+--------------+ CFV      Full           Yes      Yes                                 +---------+---------------+---------+-----------+----------+--------------+ SFJ      Full                                                        +---------+---------------+---------+-----------+----------+--------------+ FV Prox  Full                                                        +---------+---------------+---------+-----------+----------+--------------+ FV Mid   Full                                                        +---------+---------------+---------+-----------+----------+--------------+ FV DistalFull                                                        +---------+---------------+---------+-----------+----------+--------------+ PFV      Full                                                         +---------+---------------+---------+-----------+----------+--------------+ POP      Full           Yes      Yes                                 +---------+---------------+---------+-----------+----------+--------------+ PTV      Full                                                        +---------+---------------+---------+-----------+----------+--------------+ PERO     Full                                                        +---------+---------------+---------+-----------+----------+--------------+   +---------+---------------+---------+-----------+----------+--------------+ LEFT     CompressibilityPhasicitySpontaneityPropertiesThrombus  Aging +---------+---------------+---------+-----------+----------+--------------+ CFV      Full           Yes      Yes                                 +---------+---------------+---------+-----------+----------+--------------+ SFJ      Full                                                        +---------+---------------+---------+-----------+----------+--------------+ FV Prox  Full                                                        +---------+---------------+---------+-----------+----------+--------------+ FV Mid   Full                                                        +---------+---------------+---------+-----------+----------+--------------+ FV DistalFull                                                        +---------+---------------+---------+-----------+----------+--------------+ PFV      Full                                                        +---------+---------------+---------+-----------+----------+--------------+ POP      Full           Yes      Yes                                 +---------+---------------+---------+-----------+----------+--------------+ PTV      Full                                                         +---------+---------------+---------+-----------+----------+--------------+ PERO     Full                                                        +---------+---------------+---------+-----------+----------+--------------+     Summary: BILATERAL: - No evidence of deep vein thrombosis seen in the lower extremities, bilaterally. -   *See table(s) above for measurements and observations. Electronically signed by Jamelle Haring on 01/07/2021 at 5:00:30 PM.    Final     PHYSICAL EXAM GENERAL: Awake, alert in  NAD. Oriented x4.  HEENT: - Normocephalic and atraumatic LUNGS - Symmetrical chest rise, No labored breathing noted CV - no JVD, No Peripheral Edema ABDOMEN - Soft,  nondistended  Ext: warm, well perfused, intact peripheral pulses, no Peripheral edema  NEURO Exam:   Mental Status: AA&Ox3. Oriented to self, age, place, time and situation. Knows month and year. Good Attention and Concentration Language: speech is fluent.  Pt is able to name simple objects, repetition intact,  fluency, and comprehension intact. Cranial Nerves:   CN II Pupils equal and reactive to light, no VF deficits   CN III,IV,VI EOM intact, Lt gaze preference better than yesterday, no nystagmus. Rt sided Neglect present   CN V normal sensation in V1, V2, and V3 segments bilaterally   CN VII no asymmetry, no nasolabial fold flattening    CN VIII normal hearing to speech    CN IX & X normal palatal elevation, no uvular deviation    CN XI 5/5 head turn and 5/5 shoulder shrug bilaterally    CN XII midline tongue protrusion    Motor: No Drift in Upper Extremities, No Drift in LE's. Strength 5/5 in Rt UE, Mild decreased strength observed on RT Hand demonstrated by fast finger tapping which has improved since yesterday. 5/5 in Lt UE, Strength in Rt LE  5/5, Lt LE 5/5 Tone: is normal and bulk is normal Sensation- Intact to light touch bilaterally. Sensory neglect on Rt side when touched on both sides.  Coordination: FTN intact  bilateral. No ataxia  Gait- deferred  ASSESSMENT/PLAN Ms. ABIGAYL HOR is a 80 y.o. female with history of  HLD, HTN, PVD, and hypothyroidism presenting with slurred speech, difficulty finding her words, and right facial droop. Initial CT Head negative for acute intracranial abnormality. CTA head and neck shows no large vessel occlusion. MRI showed 3.5 cm left frontal opercular and left parietal infarct with distal left MCA M3 occlusion. tPA was not given. Echo shows LVEF 60 to 65%, lower extremity Doppler shows no DVT, Hb A1c 6.0, LDL 68, creatinine 0.83. Repeat CT head  (01/07/21) showed acute infarct of left frontal lobe with developing hypodensity but no hemorrhage. Patient is on DAPT (aspirin 81 mg and Plavix 75 mg). Request for Loop recorder placed yesterday. PT recommends CIR. Neurology will sign off.   Stroke:  left MCA infarcts embolic secondary to unknown source, concerning for embolic.   Code Stroke CT head No acute abnormality. ASPECTS 10.     CTA head & neck no LVO  MRI  L frontal opercular and L parietal infarct; distal L MCA M3 occlusion    Repeat CT head  (01/07/21) showed acute infarct of left frontal lobe with developing hypodensity but no hemorrhage.  LE Doppler  No DVT  2D Echo- LVEF 60 to 65%, mild to moderate AVR and mild MVR  Recommend loop recorder to rule out A. Fib- Request placed yesterday.   LDL 68  HgbA1c 6.0  VTE prophylaxis - Lovenox 40 mg sq daily   aspirin 81 mg daily prior to admission, now on aspirin 81 mg daily and clopidogrel 75 mg daily. Continue DAPT x 21 days then plavix alone  Therapy recommendations:  CIR  Disposition:  Neurology will sign off.   Hypertension  Stable . Permissive hypertension (OK if < 220/120) but gradually normalize in 2-3 days . Long-term BP goal normotensive  Hyperlipidemia  Home meds:  zocor 20, resumed in hospital  LDL 68, goal < 70  High intensity statin  not indicated as pt LDL at goal  Continue statin at  discharge  Other Stroke Risk Factors  Advanced Age >/= 8   PVD  Other Active Problems  Breast cancer 2019 s/p lumpectomy and XRT. On tomoxifen 3/5 yrs. On hold by IM for potential thromboembolic infarct   Hypothyroidism - TSH- 3.265 - On Synthroid 75 MCG daily  Hospital day # 1    ATTENDING NOTE: I reviewed above note and agree with the assessment and plan. Pt was seen and examined.   Daughter at bedside.  Patient lying in bed, awake alert mildly lethargic.  Speech no aphasia, no significant dysarthria, able to name and repeat, spelling world backwards correct.  Visual field full, no simultanagnosia today.  Still has mild right facial droop but no left arm drift and finger grip and dexterity seems much improved.  No ataxia, however still has right sensory neglect.  CT repeated yesterday no acute change, no bleeding.  Patient stroke concerning for occult A. fib, recommend loop recorder before discharge.  PT/OT recommend CIR.  Continue DAPT for 21 days and then Plavix alone.  Continue statin.  Neurology will sign off. Please call with questions. Pt will follow up with stroke clinic NP at [facility] in about 4 weeks. Thanks for the consult.   Rosalin Hawking, MD PhD Stroke Neurology 01/08/2021 4:46 PM      To contact Stroke Continuity provider, please refer to [email or web address] After hours, contact General Neurology

## 2021-01-08 NOTE — Progress Notes (Signed)
Progress Note    Crystal Trevino  POE:[id number] DOB: February 16, 1941  DOA: 01/06/2021 PCP: Cari Caraway, MD    Brief Narrative:   Chief complaint: Dysarthria/right facial droop  Medical records reviewed and are as summarized below:  Crystal Trevino is an 80 y.o. female with a PMH of hypertension, hyperlipidemia, hypothyroidism, right breast cancer s/p lumpectomy and adjuvant radiation currently on tamoxifen who presented to the ED via EMS for evaluation of dysarthria and right facial droop. Symptoms had resolved on arrival to ED. In the ED, VSS. Labs showed WBC 5.0, hemoglobin 10.3, platelets 190,000, sodium 137, potassium 4.0, bicarb 23, BUN 19, creatinine 0.8, serum glucose 141, LFT within normal limits.  SARS-CoV-2 PCR is negative. CT head without contrast was negative for evidence of acute intracranial abnormality.  CTA head/neck are negative for evidence of large vessel occlusion or proximal hemodynamically significant stenosis in the head or neck. Neurology was consulted and recommended further stroke work-up.  Patient was given aspirin 324 mg once and the hospitalist service was consulted to admit for further evaluation and management. Stroke work up completed, PT recommends CIR.  Will need a loop recorder prior to discharge.  Assessment/Plan:   Principal Problem: Acute CVA with aphasia, mild cognitive impairment, right inattention/neglect/dysarthria/right facial droop, POA CT head and CTA head/neck negative. MRI showed acute stroke affecting the left frontal operculum and left parietal lobe. Neurology following. Received aspirin 325 mg in the ED. Full stroke work up in progress.  Echo showed EF 60-65%, grade II diastolic dysfunction. No embolic source. Loop recorder to rule out PAF planned. Continue ASA/Plavix x 21 days, then plavix alone. Lower BP slowly, resume Coreg today.Total cholesterol 154, LDL 68.  Hgb A1c 6.0% (suggestive of pre-diabetes). Neurologist ordered a repeat CT given  nausea/vomiting/headache to rule out bleed: No acute hemorrhage or midline shift. PT/OT following. May be a candidate for CIR, referral made. Patient unsure if she would accept CIR and prefers outpatient PT.  Active Problems: Normocytic anemia, POA Mild, hemoglobin stable.  Hypertension, chronic, POA Coreg and telmisartan held on admission to allow permissive hypertension. Slowly normalize BP.  Resume Coreg.  Hyperlipidemia, chronic Continue Zocor, LDL at goal.  Hypothyroidism, chronic Continue levothyroxine. TSH 3.265, good control.  H/O Ductal carcinoma in situ right breast  S/P lumpectomy and adjuvant radiation.  Has been on tamoxifen for almost 3 years out of planned 5-year course.  Tamoxifen held due to association with thromboembolic stroke. F/U with oncologist as an outpatient for recommendations.  Pre-diabetes, POA Hgb A1c consistent with pre-diabetes.  Dietician consulted to provide diet education. Counseled patient on these findings.   Body mass index is 28.04 kg/m.   Family Communication/Anticipated D/C date and plan/Code Status   DVT prophylaxis: enoxaparin (LOVENOX) injection 40 mg Start: 01/06/21 2100   Current Level of Care:: Telemetry Medical Code Status: Full Code.  Family Communication: Daughter at bedside. Disposition Plan: Status is: Observation  The patient will require care spanning > 2 midnights and should be moved to inpatient because: Acute CVA with deficits.  Dispo: The patient is from: Home              Anticipated d/c is to: To be determined              Anticipated d/c date is: 1 day              Patient currently is not medically stable to d/c.   Difficult to place patient No  Medical Consultants:    Neurology  Rehab MD   Anti-Infectives:    None  Subjective:   Patient denies headache today and has not had any nausea or vomiting today. Poor appetite.  Has not moved her bowels since admission. Worked with PT today.    Objective:    Vitals:   01/07/21 1750 01/07/21 1956 01/07/21 2325 01/08/21 0328  BP: (!) 161/66 (!) 175/65 140/60 (!) 162/60  Pulse: 74 66 65 63  Resp: (!) 22 18 18 18   Temp: 98.3 F (36.8 C) 97.9 F (36.6 C) 97.7 F (36.5 C) 98 F (36.7 C)  TempSrc: Oral Oral Oral Oral  SpO2:  99% 100% 99%  Weight:      Height:       No intake or output data in the 24 hours ending 01/08/21 0742 Filed Weights   01/06/21 1600 01/06/21 1734  Weight: 74.8 kg 78.8 kg    Exam: General: Frail appearing. Cardiovascular: Heart sounds show a regular rate, and rhythm. No gallops or rubs. No murmurs. No JVD. Lungs: Clear to auscultation bilaterally with good air movement. No rales, rhonchi or wheezes. Abdomen: Soft, nontender, nondistended with normal active bowel sounds. No masses. No hepatosplenomegaly. Neurological: Alert and oriented 3. Moves all extremities 4 with equal strength. Right sided facial droop less prominent today.  Still with right sided neglect +/- right field cut. Subtle weakness noted on right. Skin: Warm and dry. No rashes or lesions. Extremities: No clubbing or cyanosis. No edema. Pedal pulses 2+. Psychiatric: Mood and affect are depressed/flat. Insight and judgment are fair.     Data Reviewed:   I have personally reviewed following labs and imaging studies:  Labs: Labs show the following:   Basic Metabolic Panel: Recent Labs  Lab 01/06/21 1652 01/06/21 1654 01/07/21 0356 01/08/21 0044  NA 137 137 139 138  K 4.0 4.0 4.4 4.0  CL 103 103 105 103  CO2 23  --  24 25  GLUCOSE 141* 130* 125* 126*  BUN 19 22 13 12   CREATININE 0.88 0.70 0.83 0.80  CALCIUM 9.0  --  8.8* 8.7*   GFR Estimated Creatinine Clearance: 61.4 mL/min (by C-G formula based on SCr of 0.8 mg/dL). Liver Function Tests: Recent Labs  Lab 01/06/21 1652  AST 18  ALT 11  ALKPHOS 52  BILITOT 0.6  PROT 6.1*  ALBUMIN 3.4*   Coagulation profile Recent Labs  Lab 01/06/21 1652  INR 0.9     CBC: Recent Labs  Lab 01/06/21 1652 01/06/21 1654 01/07/21 0356  WBC 5.0  --  6.1  NEUTROABS 3.0  --   --   HGB 10.3* 11.2* 10.2*  HCT 32.5* 33.0* 32.3*  MCV 97.0  --  95.6  PLT 190  --  195   CBG: Recent Labs  Lab 01/06/21 1647  GLUCAP 123*   Hgb A1c: Recent Labs    01/07/21 0357  HGBA1C 6.0*   Lipid Profile: Recent Labs    01/07/21 0356  CHOL 154  HDL 58  LDLCALC 68  TRIG 139  CHOLHDL 2.7   Thyroid function studies: Recent Labs    01/07/21 0356  TSH 3.265    Microbiology Recent Results (from the past 240 hour(s))  SARS Coronavirus 2 by RT PCR (hospital order, performed in [facility] hospital lab) Nasopharyngeal Nasopharyngeal Swab     Status: None   Collection Time: 01/06/21  4:45 PM   Specimen: Nasopharyngeal Swab  Result Value Ref Range Status   SARS Coronavirus  2 NEGATIVE NEGATIVE Final    Comment: (NOTE) SARS-CoV-2 target nucleic acids are NOT DETECTED.  The SARS-CoV-2 RNA is generally detectable in upper and lower respiratory specimens during the acute phase of infection. The lowest concentration of SARS-CoV-2 viral copies this assay can detect is 250 copies / mL. A negative result does not preclude SARS-CoV-2 infection and should not be used as the sole basis for treatment or other patient management decisions.  A negative result may occur with improper specimen collection / handling, submission of specimen other than nasopharyngeal swab, presence of viral mutation(s) within the areas targeted by this assay, and inadequate number of viral copies (<250 copies / mL). A negative result must be combined with clinical observations, patient history, and epidemiological information.  Fact Sheet for Patients:   StrictlyIdeas.no  Fact Sheet for Healthcare Providers: BankingDealers.co.za  This test is not yet approved or  cleared by the [place] FDA and has been authorized for detection  and/or diagnosis of SARS-CoV-2 by FDA under an Emergency Use Authorization (EUA).  This EUA will remain in effect (meaning this test can be used) for the duration of the COVID-19 declaration under Section 564(b)(1) of the Act, 21 U.S.C. section 360bbb-3(b)(1), unless the authorization is terminated or revoked sooner.  Performed at [facility], [place] [address], [place], [place] [address]     Procedures and diagnostic studies:  CT HEAD WO CONTRAST  Result Date: 01/07/2021 CLINICAL DATA:  Stroke follow-up EXAM: CT HEAD WITHOUT CONTRAST TECHNIQUE: Contiguous axial images were obtained from the base of the skull through the vertex without intravenous contrast. COMPARISON:  CT head and MRI head 01/06/2021 FINDINGS: Brain: 3.5 cm hypodensity left frontal lobe compatible with acute infarct. This shows restricted diffusion on MRI. Mild local mass-effect. No midline shift. No acute hemorrhage. Ventricle size normal.  No other area of acute infarct. Vascular: Negative for hyperdense vessel Skull: Negative Sinuses/Orbits: Mild mucosal edema left maxillary sinus otherwise clear sinuses. Bilateral cataract extraction Other: None IMPRESSION: Acute infarct left frontal lobe with developing hypodensity but no acute hemorrhage. No midline shift. Electronically Signed   By: Franchot Gallo M.D.   On: 01/07/2021 13:23   MR BRAIN WO CONTRAST  Result Date: 01/06/2021 CLINICAL DATA:  Hypertension.  TIA presentation. EXAM: MRI HEAD WITHOUT CONTRAST TECHNIQUE: Multiplanar, multiecho pulse sequences of the brain and surrounding structures were obtained without intravenous contrast. COMPARISON:  CT studies earlier same day FINDINGS: Brain: Diffusion imaging shows a 3.5 cm region of acute infarction in the left frontal operculum. Mild swelling but no evidence of hemorrhage. Second separate area of acute infarction posterior to that in the left parietal lobe, considerably smaller. No other vascular territory insult.  Brainstem and cerebellum are normal. Cerebral hemispheres elsewhere not show evidence of small-vessel disease or other large vessel infarction. No mass, hydrocephalus or extra-axial collection. Vascular: Major vessels at the base of the brain show flow. Skull and upper cervical spine: Negative Sinuses/Orbits: Clear/normal Other: None IMPRESSION: 1. 3.5 cm region of acute infarction in the left frontal operculum. Mild swelling but no hemorrhage. Findings consistent with distal vessel left MCA occlusion, at least M3. 2. Second separate area of acute infarction posterior to that in the left parietal lobe, considerably smaller. This is also consistent with a distal branch vessel left MCA occlusion. Electronically Signed   By: Nelson Chimes M.D.   On: 01/06/2021 22:08   ECHOCARDIOGRAM COMPLETE  Result Date: 01/07/2021    ECHOCARDIOGRAM REPORT   Patient Name:  Crystal Trevino Date of Exam: 01/07/2021 Medical Rec #:  [id number]        Height:       66.0 in Accession #:    [id number]       Weight:       173.7 lb Date of Birth:  Apr 11, 1941         BSA:          1.884 m Patient Age:    55 years         BP:           129/100 mmHg Patient Gender: F                HR:           77 bpm. Exam Location:  Inpatient Procedure: 2D Echo, 3D Echo, Cardiac Doppler and Color Doppler Indications:    TIA  History:        Patient has no prior history of Echocardiogram examinations.                 Risk Factors:Hypertension and Dyslipidemia. Breast cancer.  Sonographer:    Roseanna Rainbow RDCS Referring Phys: [id number] [place]  Sonographer Comments: Suboptimal parasternal window. IMPRESSIONS  1. Left ventricular ejection fraction, by estimation, is 60 to 65%. The left ventricle has normal function. The left ventricle has no regional wall motion abnormalities. There is mild left ventricular hypertrophy. Left ventricular diastolic parameters are consistent with Grade II diastolic dysfunction (pseudonormalization).  2. Right ventricular systolic  function is normal. The right ventricular size is normal. There is normal pulmonary artery systolic pressure. The estimated right ventricular systolic pressure is 02.6 mmHg.  3. The mitral valve is normal in structure. Mild mitral valve regurgitation. No evidence of mitral stenosis.  4. The aortic valve is tricuspid. Aortic valve regurgitation is mild to moderate. Mild aortic valve sclerosis is present, with no evidence of aortic valve stenosis.  5. The inferior vena cava is normal in size with greater than 50% respiratory variability, suggesting right atrial pressure of 3 mmHg. FINDINGS  Left Ventricle: Left ventricular ejection fraction, by estimation, is 60 to 65%. The left ventricle has normal function. The left ventricle has no regional wall motion abnormalities. The left ventricular internal cavity size was normal in size. There is  mild left ventricular hypertrophy. Left ventricular diastolic parameters are consistent with Grade II diastolic dysfunction (pseudonormalization). Right Ventricle: The right ventricular size is normal. No increase in right ventricular wall thickness. Right ventricular systolic function is normal. There is normal pulmonary artery systolic pressure. The tricuspid regurgitant velocity is 2.46 m/s, and  with an assumed right atrial pressure of 3 mmHg, the estimated right ventricular systolic pressure is 37.8 mmHg. Left Atrium: Left atrial size was normal in size. Right Atrium: Right atrial size was normal in size. Pericardium: There is no evidence of pericardial effusion. Mitral Valve: The mitral valve is normal in structure. There is mild calcification of the mitral valve leaflet(s). Mild mitral annular calcification. Mild mitral valve regurgitation. No evidence of mitral valve stenosis. MV peak gradient, 8.6 mmHg. The mean mitral valve gradient is 3.0 mmHg. Tricuspid Valve: The tricuspid valve is normal in structure. Tricuspid valve regurgitation is trivial. Aortic Valve: The aortic  valve is tricuspid. Aortic valve regurgitation is mild to moderate. Aortic regurgitation PHT measures 382 msec. Mild aortic valve sclerosis is present, with no evidence of aortic valve stenosis. Pulmonic Valve: The pulmonic valve was normal in structure. Pulmonic valve regurgitation  is trivial. Aorta: The aortic root is normal in size and structure. Venous: The inferior vena cava is normal in size with greater than 50% respiratory variability, suggesting right atrial pressure of 3 mmHg. IAS/Shunts: No atrial level shunt detected by color flow Doppler.  LEFT VENTRICLE PLAX 2D LVIDd:         3.60 cm      Diastology LVIDs:         2.50 cm      LV e' medial:    6.96 cm/s LV PW:         1.30 cm      LV E/e' medial:  22.3 LV IVS:        1.10 cm      LV e' lateral:   9.79 cm/s LVOT diam:     1.70 cm      LV E/e' lateral: 15.8 LV SV:         45 LV SV Index:   24 LVOT Area:     2.27 cm                              3D Volume EF: LV Volumes (MOD)            3D EF:        61 % LV vol d, MOD A2C: 101.0 ml LV EDV:       143 ml LV vol d, MOD A4C: 66.6 ml  LV ESV:       56 ml LV vol s, MOD A2C: 39.5 ml  LV SV:        87 ml LV vol s, MOD A4C: 26.2 ml LV SV MOD A2C:     61.5 ml LV SV MOD A4C:     66.6 ml LV SV MOD BP:      58.4 ml RIGHT VENTRICLE             IVC RV S prime:     12.00 cm/s  IVC diam: 1.40 cm TAPSE (M-mode): 2.3 cm LEFT ATRIUM             Index       RIGHT ATRIUM           Index LA diam:        3.70 cm 1.96 cm/m  RA Area:     13.70 cm LA Vol (A2C):   29.9 ml 15.87 ml/m RA Volume:   27.60 ml  14.65 ml/m LA Vol (A4C):   41.0 ml 21.77 ml/m LA Biplane Vol: 35.7 ml 18.95 ml/m  AORTIC VALVE LVOT Vmax:   89.40 cm/s LVOT Vmean:  57.200 cm/s LVOT VTI:    0.199 m AI PHT:      382 msec  AORTA Ao Asc diam: 3.60 cm MITRAL VALVE                TRICUSPID VALVE MV Area (PHT): 3.77 cm     TR Peak grad:   24.2 mmHg MV Area VTI:   1.14 cm     TR Vmax:        246.00 cm/s MV Peak grad:  8.6 mmHg MV Mean grad:  3.0 mmHg     SHUNTS  MV Vmax:       1.47 m/s     Systemic VTI:  0.20 m MV Vmean:      76.5 cm/s    Systemic Diam: 1.70 cm MV Decel Time: 201  msec MV E velocity: 155.00 cm/s MV A velocity: 104.00 cm/s MV E/A ratio:  1.49 Loralie Champagne MD Electronically signed by Loralie Champagne MD Signature Date/Time: 01/07/2021/2:37:34 PM    Final    CT HEAD CODE STROKE WO CONTRAST  Result Date: 01/06/2021 CLINICAL DATA:  Code stroke. Neuro deficit, acute stroke suspected. Slurred speech with right-sided facial droop. EXAM: CT HEAD WITHOUT CONTRAST TECHNIQUE: Contiguous axial images were obtained from the base of the skull through the vertex without intravenous contrast. COMPARISON:  None. FINDINGS: Brain: No evidence of acute large vascular territory infarction, hemorrhage, hydrocephalus, extra-axial collection or mass lesion/mass effect. Mild patchy white matter hypoattenuation, most likely related to chronic microvascular ischemic disease. Vascular: No hyperdense vessel identified. Calcific atherosclerosis. Skull: No acute fracture. Sinuses/Orbits: No acute findings. Other: No mastoid effusions. ASPECTS [facility] Stroke Program Early CT Score) total score (0-10 with 10 being normal): 10. IMPRESSION: 1. No evidence of acute intracranial abnormality. 2. ASPECTS is 10. Code stroke imaging results were communicated on 01/06/2021 at 5:00 pm to provider Dr. Theda Sers via telephone, who verbally acknowledged these results. Electronically Signed   By: Margaretha Sheffield MD   On: 01/06/2021 17:03   VAS [place] LOWER EXTREMITY VENOUS (DVT)  Result Date: 01/07/2021  Lower Venous DVT Study Indications: Stroke.  Comparison Study: No prior study on file Performing Technologist: Sharion Dove RVS  Examination Guidelines: A complete evaluation includes B-mode imaging, spectral Doppler, color Doppler, and power Doppler as needed of all accessible portions of each vessel. Bilateral testing is considered an integral part of a complete examination. Limited examinations for  reoccurring indications may be performed as noted. The reflux portion of the exam is performed with the patient in reverse Trendelenburg.  +---------+---------------+---------+-----------+----------+--------------+ RIGHT    CompressibilityPhasicitySpontaneityPropertiesThrombus Aging +---------+---------------+---------+-----------+----------+--------------+ CFV      Full           Yes      Yes                                 +---------+---------------+---------+-----------+----------+--------------+ SFJ      Full                                                        +---------+---------------+---------+-----------+----------+--------------+ FV Prox  Full                                                        +---------+---------------+---------+-----------+----------+--------------+ FV Mid   Full                                                        +---------+---------------+---------+-----------+----------+--------------+ FV DistalFull                                                        +---------+---------------+---------+-----------+----------+--------------+  PFV      Full                                                        +---------+---------------+---------+-----------+----------+--------------+ POP      Full           Yes      Yes                                 +---------+---------------+---------+-----------+----------+--------------+ PTV      Full                                                        +---------+---------------+---------+-----------+----------+--------------+ PERO     Full                                                        +---------+---------------+---------+-----------+----------+--------------+   +---------+---------------+---------+-----------+----------+--------------+ LEFT     CompressibilityPhasicitySpontaneityPropertiesThrombus Aging  +---------+---------------+---------+-----------+----------+--------------+ CFV      Full           Yes      Yes                                 +---------+---------------+---------+-----------+----------+--------------+ SFJ      Full                                                        +---------+---------------+---------+-----------+----------+--------------+ FV Prox  Full                                                        +---------+---------------+---------+-----------+----------+--------------+ FV Mid   Full                                                        +---------+---------------+---------+-----------+----------+--------------+ FV DistalFull                                                        +---------+---------------+---------+-----------+----------+--------------+ PFV      Full                                                        +---------+---------------+---------+-----------+----------+--------------+  POP      Full           Yes      Yes                                 +---------+---------------+---------+-----------+----------+--------------+ PTV      Full                                                        +---------+---------------+---------+-----------+----------+--------------+ PERO     Full                                                        +---------+---------------+---------+-----------+----------+--------------+     Summary: BILATERAL: - No evidence of deep vein thrombosis seen in the lower extremities, bilaterally. -   *See table(s) above for measurements and observations. Electronically signed by Jamelle Haring on 01/07/2021 at 5:00:30 PM.    Final    CT ANGIO HEAD CODE STROKE  Result Date: 01/06/2021 CLINICAL DATA:  Stroke/TIA, assess extracranial arteries. EXAM: CT HEAD WITHOUT CONTRAST CT ANGIOGRAPHY OF THE HEAD AND NECK TECHNIQUE: Contiguous axial images were obtained from the base of the skull through  the vertex without intravenous contrast. Multidetector CT imaging of the head and neck was performed using the standard protocol during bolus administration of intravenous contrast. Multiplanar CT image reconstructions and MIPs were obtained to evaluate the vascular anatomy. Carotid stenosis measurements (when applicable) are obtained utilizing NASCET criteria, using the distal internal carotid diameter as the denominator. CONTRAST:  46mL OMNIPAQUE IOHEXOL 350 MG/ML SOLN COMPARISON:  Same day head CT FINDINGS: CTA NECK Aortic arch: Great vessel origins are patent. Right carotid system: No evidence of hemodynamically significant stenosis or dissection. Left carotid system: No evidence of hemodynamically significant stenosis or dissection. Vertebral arteries:Codominant. No evidence of significant stenosis or dissection. Skeleton: Degenerative changes of the cervical spine, greatest at C5-C6 where there is disc height loss, endplate sclerosis and posterior disc osteophyte complex. There is osseous fusion across the C7-T1 vertebral bodies, possibly congenital. Other neck: No evidence of mass or suspicious adenopathy. CTA HEAD Anterior circulation: No evidence of large vessel occlusion, proximal hemodynamically significant stenosis or aneurysm. Mild calcific atherosclerosis of bilateral cavernous carotid arteries. Posterior circulation: No evidence of large vessel occlusion, proximal hemodynamically significant stenosis or aneurysm. Mild stenosis of the intradural left vertebral artery. wMildly dysplastic basilar tip without discrete aneurysm. Venous sinuses: Within the limitation of arterial timing, no evidence of dural sinus thrombosis. Small left transverse sinus. IMPRESSION: No evidence of large vessel occlusion or proximal hemodynamically significant stenosis in the head or neck. Findings were discussed with Dr. Theda Sers via telephone on 01/06/2021 at 5:00 p.m. Electronically Signed   By: Margaretha Sheffield MD   On:  01/06/2021 18:06   CT ANGIO NECK CODE STROKE  Result Date: 01/06/2021 CLINICAL DATA:  Stroke/TIA, assess extracranial arteries. EXAM: CT HEAD WITHOUT CONTRAST CT ANGIOGRAPHY OF THE HEAD AND NECK TECHNIQUE: Contiguous axial images were obtained from the base of the skull through the vertex without intravenous contrast. Multidetector CT imaging of the head and neck was performed using  the standard protocol during bolus administration of intravenous contrast. Multiplanar CT image reconstructions and MIPs were obtained to evaluate the vascular anatomy. Carotid stenosis measurements (when applicable) are obtained utilizing NASCET criteria, using the distal internal carotid diameter as the denominator. CONTRAST:  63mL OMNIPAQUE IOHEXOL 350 MG/ML SOLN COMPARISON:  Same day head CT FINDINGS: CTA NECK Aortic arch: Great vessel origins are patent. Right carotid system: No evidence of hemodynamically significant stenosis or dissection. Left carotid system: No evidence of hemodynamically significant stenosis or dissection. Vertebral arteries:Codominant. No evidence of significant stenosis or dissection. Skeleton: Degenerative changes of the cervical spine, greatest at C5-C6 where there is disc height loss, endplate sclerosis and posterior disc osteophyte complex. There is osseous fusion across the C7-T1 vertebral bodies, possibly congenital. Other neck: No evidence of mass or suspicious adenopathy. CTA HEAD Anterior circulation: No evidence of large vessel occlusion, proximal hemodynamically significant stenosis or aneurysm. Mild calcific atherosclerosis of bilateral cavernous carotid arteries. Posterior circulation: No evidence of large vessel occlusion, proximal hemodynamically significant stenosis or aneurysm. Mild stenosis of the intradural left vertebral artery. wMildly dysplastic basilar tip without discrete aneurysm. Venous sinuses: Within the limitation of arterial timing, no evidence of dural sinus thrombosis. Small  left transverse sinus. IMPRESSION: No evidence of large vessel occlusion or proximal hemodynamically significant stenosis in the head or neck. Findings were discussed with Dr. Theda Sers via telephone on 01/06/2021 at 5:00 p.m. Electronically Signed   By: Margaretha Sheffield MD   On: 01/06/2021 18:06    Medications:   .  stroke: mapping our early stages of recovery book   Does not apply Once  . aspirin EC  81 mg Oral Daily  . clopidogrel  75 mg Oral Daily  . enoxaparin (LOVENOX) injection  40 mg Subcutaneous Q24H  . levothyroxine  75 mcg Oral QAC breakfast  . simvastatin  20 mg Oral QHS   Continuous Infusions:   LOS: 1 day   Jacquelynn Cree, MD  [facility]   [facility] How to contact the [facility] Attending or Consulting provider [place] or covering provider during after hours [place], for this patient?  1. Check the care team in [facility] and look for a) attending/consulting TRH provider listed and b) the [facility] team listed 2. Log into [email or web address] and use [place] [place] universal password to access. If you do not have the password, please contact the hospital operator. 3. Locate the [facility] provider you are looking for under [facility] and page to a number that you can be directly reached. 4. If you still have difficulty reaching the provider, please page the [facility] (Director on Call) for the Hospitalists listed on amion for assistance.  01/08/2021, 7:42 AM

## 2021-01-09 DIAGNOSIS — I10 Essential (primary) hypertension: Secondary | ICD-10-CM | POA: Diagnosis not present

## 2021-01-09 NOTE — Progress Notes (Signed)
Physical Therapy Treatment Patient Details Name: Crystal Trevino MRN: [id number] DOB: 1941/07/18 Today's Date: 01/09/2021    History of Present Illness Pt is a 80 yo female admitted secondary to slurred speech. 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 PMHx: R breast cancer s/p lumpectomy and HTN.    PT Comments    Pt is progressing well towards goals. She is very flat, with eyes closed but willing to participate. Repeat cues required at times to remain on task. Pt was able to progress OOB to recliner chair for 5x sit<>stand, with multimodal cues to remain on task. She ambulated in hall with room finding challenge to encourage eyes open. Noted pt reported pain in eyes, eyes closed, and trouble tracking. RN notified. Will continue to follow acutely for mobility progression.     Follow Up Recommendations  CIR     Equipment Recommendations  3in1 (PT)    Recommendations for Other Services Rehab consult     Precautions / Restrictions Precautions Precautions: Fall    Mobility  Bed Mobility Overal bed mobility: Needs Assistance Bed Mobility: Supine to Sit     Supine to sit: Supervision;HOB elevated     General bed mobility comments: Supervision for safety, use of rails.  Transfers Overall transfer level: Needs assistance Equipment used: None Transfers: Sit to/from Stand Sit to Stand: Min assist Stand pivot transfers: Min assist       General transfer comment: min A to rise from EOB and recliner chair. cues for hand placement.  Ambulation/Gait Ambulation/Gait assistance: Min assist Gait Distance (Feet): 125 Feet Assistive device: 1 person hand held assist Gait Pattern/deviations: Step-through pattern;Decreased stride length;Drifts right/left Gait velocity: Decreased   General Gait Details: Slow, mildly unsteady gait with HHA. Pt given room finding challange to encourage eyes open. Pt able  to recognize room by seeing daughter.   Stairs             Wheelchair Mobility    Modified Rankin (Stroke Patients Only) Modified Rankin (Stroke Patients Only) Pre-Morbid Rankin Score: No symptoms Modified Rankin: Moderately severe disability     Balance Overall balance assessment: Needs assistance Sitting-balance support: Feet supported;No upper extremity supported Sitting balance-Leahy Scale: Good     Standing balance support: During functional activity Standing balance-Leahy Scale: Fair Standing balance comment: Able to stand at sink and wash hands without difficulty or LOB, close min guard for safety.                            Cognition Arousal/Alertness: Lethargic Behavior During Therapy: Flat affect Overall Cognitive Status: Impaired/Different from baseline Area of Impairment: Following commands;Safety/judgement;Awareness;Problem solving                 Orientation Level:  (not checked this session) Current Attention Level: Selective Memory: Decreased short-term memory Following Commands: Follows one step commands inconsistently;Follows one step commands with increased time Safety/Judgement: Decreased awareness of safety;Decreased awareness of deficits Awareness: Emergent Problem Solving: Decreased initiation;Requires verbal cues General Comments: Pt with eyes closed frequently throughout session. Pt reports she does not realize she is doing it. Frequent cues required to keep eyes open.      Exercises Other Exercises Other Exercises: 5x sit<>stand for neuromuscular re-ed. multimodal cues for sequencing.    General Comments General comments (skin integrity, edema, etc.): Pt reports eye pain. Eyes closed frequently during session. Pt with trouble  tracking. RN Notified.      Pertinent Vitals/Pain Pain Assessment: No/denies pain    Home Living                      Prior Function            PT Goals (current goals can now  be found in the care plan section) Acute Rehab PT Goals PT Goal Formulation: With patient Time For Goal Achievement: 01/21/21 Potential to Achieve Goals: Good Progress towards PT goals: Progressing toward goals    Frequency    Min 4X/week      PT Plan Current plan remains appropriate    Co-evaluation              AM-PAC PT "6 Clicks" Mobility   Outcome Measure  Help needed turning from your back to your side while in a flat bed without using bedrails?: None Help needed moving from lying on your back to sitting on the side of a flat bed without using bedrails?: A Little Help needed moving to and from a bed to a chair (including a wheelchair)?: A Little Help needed standing up from a chair using your arms (e.g., wheelchair or bedside chair)?: A Little Help needed to walk in hospital room?: A Little Help needed climbing 3-5 steps with a railing? : A Little 6 Click Score: 19    End of Session Equipment Utilized During Treatment: Gait belt Activity Tolerance: Patient tolerated treatment well Patient left: in chair;with call bell/phone within reach;with chair alarm set;with family/visitor present Nurse Communication: Mobility status PT Visit Diagnosis: Unsteadiness on feet (R26.81);Other symptoms and signs involving the nervous system (M60.045)     Time: 9977-4142 PT Time Calculation (min) (ACUTE ONLY): 21 min  Charges:  $Gait Training: 8-22 mins                    Benjiman Core, [place] Pager [phone] Acute Rehab  Allena Katz 01/09/2021, 2:41 PM

## 2021-01-09 NOTE — Progress Notes (Addendum)
Inpatient Rehab Admissions Coordinator:   I have no beds available for this patient to admit to CIR today.  Will continue to follow for timing of potential admission pending bed availability, hopefully in the next 1-2 days. Note that pt's primary insurance is Medicare Part A only, so no prior auth required.   Shann Medal, PT, DPT Admissions Coordinator [phone] 01/09/21  9:53 AM

## 2021-01-09 NOTE — Progress Notes (Signed)
PROGRESS NOTE    Crystal Trevino  HGD:[id number] DOB: Sep 20, 1941 DOA: 01/06/2021 PCP: Cari Caraway, MD    Brief Narrative:  80 y.o. female with a PMH of hypertension, hyperlipidemia, hypothyroidism, right breast cancer s/p lumpectomy and adjuvant radiation currently on tamoxifen who presented to the ED via EMS for evaluation of dysarthria and right facial droop. Symptoms had resolved on arrival to ED. In the ED, VSS. Labs showed WBC 5.0, hemoglobin 10.3, platelets 190,000, sodium 137, potassium 4.0, bicarb 23, BUN 19, creatinine 0.8, serum glucose 141, LFT within normal limits. SARS-CoV-2 PCR is negative. CT head without contrast was negative for evidence of acute intracranial abnormality. CTA head/neck are negative for evidence of large vessel occlusion or proximal hemodynamically significant stenosis in the head or neck. Neurology was consulted and recommended further stroke work-up. Patient was given aspirin 324 mg once and the hospitalist service was consulted to admit for further evaluation and management. Stroke work up completed, PT recommends CIR.  Will need a loop recorder prior to discharge.  Assessment & Plan:   Principal Problem:   Acute CVA (cerebrovascular accident) ([place]) Active Problems:   Ductal carcinoma in situ (DCIS) of right breast   Essential hypertension   Hyperlipidemia   Hypothyroidism   Pre-diabetes   Normocytic anemia  Principal Problem: Acute CVA with aphasia, mild cognitive impairment, right inattention/neglect/dysarthria/right facial droop, POA CT head and CTA head/neck negative.MRI showed acute stroke affecting the left frontal operculum and left parietal lobe. Neurology following. Received aspirin 325 mg in the ED. Full stroke work up in progress.  Echo showed EF 60-65%, grade II diastolic dysfunction. No embolic source. Loop recorder to rule out PAF planned. Continue ASA/Plavix x 21 days, then plavix alone. Lower BP slowly, resume Coreg today.Total  cholesterol 154, LDL 68.  Hgb A1c 6.0% (suggestive of pre-diabetes). Neurologist ordered a repeat CT given nausea/vomiting/headache to rule out bleed: CT was noted to be unremarkabel. PT/OT following. Consideration for CIR, referral made, awaiting bed availability  Active Problems: Normocytic anemia, POA Mild, hemoglobin had remained  Hypertension, chronic, POA Coreg and telmisartan held on admission to allow permissive hypertension. Slowly normalize BP.  Resume Coreg.  Hyperlipidemia, chronic Continue Zocor, LDL noted to be at goal.  Hypothyroidism, chronic Continue levothyroxine. TSH 3.265, good control.  H/O Ductal carcinoma in situ right breast  S/P lumpectomy and adjuvant radiation. Has been on tamoxifen for almost 3 years out of planned5-year course. Tamoxifen was held due to association with thromboembolic stroke. F/U with oncologist as an outpatient for recommendations.  Pre-diabetes, POA Hgb A1c consistent with pre-diabetes.  Dietician consulted to provide diet education. Counseled patient on these findings.   Body mass index is 28.04 kg/m.  DVT prophylaxis: Lovenox subq Code Status: Parital Family Communication: Pt in room, family at bedside  Status is: Inpatient  Remains inpatient appropriate because:Unsafe d/c plan   Dispo: The patient is from: Home              Anticipated d/c is to: CIR              Anticipated d/c date is: 2 days              Patient currently is not medically stable to d/c.   Difficult to place patient No       Consultants:   CIR  Neurology  Procedures:     Antimicrobials: Anti-infectives (From admission, onward)   None       Subjective: Without complaints. Family reports pt seems  more drowsy today  Objective: Vitals:   01/08/21 2337 01/09/21 0403 01/09/21 0742 01/09/21 1220  BP: 130/64 (!) 150/70 (!) 152/66 (!) 146/72  Pulse: 65 71 82 75  Resp: 16 18 20 20   Temp: 98.2 F (36.8 C) 97.9 F (36.6 C) 98.1  F (36.7 C) 98.8 F (37.1 C)  TempSrc: Oral Oral Oral Oral  SpO2: 97% 97% 98% 95%  Weight:      Height:       No intake or output data in the 24 hours ending 01/09/21 1543 Filed Weights   01/06/21 1600 01/06/21 1734  Weight: 74.8 kg 78.8 kg    Examination:  General exam: Appears calm and comfortable  Respiratory system: Clear to auscultation. Respiratory effort normal. Cardiovascular system: S1 & S2 heard, Regular Gastrointestinal system: Abdomen is nondistended, soft and nontender. No organomegaly or masses felt. Normal bowel sounds heard. Central nervous system: Alert and oriented. No focal neurological deficits. Extremities: Symmetric 5 x 5 power. Skin: No rashes, lesions  Psychiatry: Judgement and insight appear normal. Mood & affect appropriate.   Data Reviewed: I have personally reviewed following labs and imaging studies  CBC: Recent Labs  Lab 01/06/21 1652 01/06/21 1654 01/07/21 0356  WBC 5.0  --  6.1  NEUTROABS 3.0  --   --   HGB 10.3* 11.2* 10.2*  HCT 32.5* 33.0* 32.3*  MCV 97.0  --  95.6  PLT 190  --  509   Basic Metabolic Panel: Recent Labs  Lab 01/06/21 1652 01/06/21 1654 01/07/21 0356 01/08/21 0044  NA 137 137 139 138  K 4.0 4.0 4.4 4.0  CL 103 103 105 103  CO2 23  --  24 25  GLUCOSE 141* 130* 125* 126*  BUN 19 22 13 12   CREATININE 0.88 0.70 0.83 0.80  CALCIUM 9.0  --  8.8* 8.7*   GFR: Estimated Creatinine Clearance: 61.4 mL/min (by C-G formula based on SCr of 0.8 mg/dL). Liver Function Tests: Recent Labs  Lab 01/06/21 1652  AST 18  ALT 11  ALKPHOS 52  BILITOT 0.6  PROT 6.1*  ALBUMIN 3.4*   No results for input(s): LIPASE, AMYLASE in the last 168 hours. No results for input(s): AMMONIA in the last 168 hours. Coagulation Profile: Recent Labs  Lab 01/06/21 1652  INR 0.9   Cardiac Enzymes: No results for input(s): CKTOTAL, CKMB, CKMBINDEX, TROPONINI in the last 168 hours. BNP (last 3 results) No results for input(s): PROBNP in  the last 8760 hours. HbA1C: Recent Labs    01/07/21 0357  HGBA1C 6.0*   CBG: Recent Labs  Lab 01/06/21 1647  GLUCAP 123*   Lipid Profile: Recent Labs    01/07/21 0356  CHOL 154  HDL 58  LDLCALC 68  TRIG 139  CHOLHDL 2.7   Thyroid Function Tests: Recent Labs    01/07/21 0356  TSH 3.265   Anemia Panel: No results for input(s): VITAMINB12, FOLATE, FERRITIN, TIBC, IRON, RETICCTPCT in the last 72 hours. Sepsis Labs: No results for input(s): PROCALCITON, LATICACIDVEN in the last 168 hours.  Recent Results (from the past 240 hour(s))  SARS Coronavirus 2 by RT PCR (hospital order, performed in [facility] hospital lab) Nasopharyngeal Nasopharyngeal Swab     Status: None   Collection Time: 01/06/21  4:45 PM   Specimen: Nasopharyngeal Swab  Result Value Ref Range Status   SARS Coronavirus 2 NEGATIVE NEGATIVE Final    Comment: (NOTE) SARS-CoV-2 target nucleic acids are NOT DETECTED.  The SARS-CoV-2 RNA is generally  detectable in upper and lower respiratory specimens during the acute phase of infection. The lowest concentration of SARS-CoV-2 viral copies this assay can detect is 250 copies / mL. A negative result does not preclude SARS-CoV-2 infection and should not be used as the sole basis for treatment or other patient management decisions.  A negative result may occur with improper specimen collection / handling, submission of specimen other than nasopharyngeal swab, presence of viral mutation(s) within the areas targeted by this assay, and inadequate number of viral copies (<250 copies / mL). A negative result must be combined with clinical observations, patient history, and epidemiological information.  Fact Sheet for Patients:   StrictlyIdeas.no  Fact Sheet for Healthcare Providers: BankingDealers.co.za  This test is not yet approved or  cleared by the [place] FDA and has been authorized for detection and/or  diagnosis of SARS-CoV-2 by FDA under an Emergency Use Authorization (EUA).  This EUA will remain in effect (meaning this test can be used) for the duration of the COVID-19 declaration under Section 564(b)(1) of the Act, 21 U.S.C. section 360bbb-3(b)(1), unless the authorization is terminated or revoked sooner.  Performed at [facility], [place] [address] [place], [place] [address]      Radiology Studies: No results found.  Scheduled Meds: .  stroke: mapping our early stages of recovery book   Does not apply Once  . aspirin EC  81 mg Oral Daily  . carvedilol  12.5 mg Oral BID WC  . clopidogrel  75 mg Oral Daily  . enoxaparin (LOVENOX) injection  40 mg Subcutaneous Q24H  . levothyroxine  75 mcg Oral QAC breakfast  . simvastatin  20 mg Oral q1800   Continuous Infusions:   LOS: 2 days   Marylu Lund, MD [facility] Pager On Amion  If 7PM-7AM, please contact night-coverage 01/09/2021, 3:43 PM

## 2021-01-10 ENCOUNTER — Encounter (HOSPITAL_COMMUNITY)
Admission: EM | Disposition: A | Discharge status: Rehabilitation Facility | Payer: Self-pay | Source: Home / Self Care | Attending: Internal Medicine

## 2021-01-10 ENCOUNTER — Encounter (HOSPITAL_COMMUNITY): Payer: Self-pay | Admitting: Cardiology

## 2021-01-10 DIAGNOSIS — I10 Essential (primary) hypertension: Secondary | ICD-10-CM | POA: Diagnosis not present

## 2021-01-10 DIAGNOSIS — I639 Cerebral infarction, unspecified: Secondary | ICD-10-CM | POA: Diagnosis not present

## 2021-01-10 HISTORY — PX: LOOP RECORDER INSERTION: EP1214

## 2021-01-10 SURGERY — LOOP RECORDER INSERTION

## 2021-01-10 MED ORDER — LIDOCAINE HCL (PF) 1 % IJ SOLN
INTRAMUSCULAR | Status: DC | PRN
  Administered 2021-01-10: 30 mL via INTRADERMAL

## 2021-01-10 MED ORDER — LIDOCAINE-EPINEPHRINE 1 %-1:100000 IJ SOLN
INTRAMUSCULAR | Status: AC
  Filled 2021-01-10: qty 1

## 2021-01-10 SURGICAL SUPPLY — 2 items
MONITOR REVEAL LINQ II (Prosthesis & Implant Heart) ×2 IMPLANT
PACK LOOP INSERTION (CUSTOM PROCEDURE TRAY) ×2 IMPLANT

## 2021-01-10 NOTE — TOC Initial Note (Deleted)
Transition of Care [facility]) - Initial/Assessment Note    Patient Details  Name: Crystal Trevino MRN: [id number] Date of Birth: Dec 28, 1940  [facility] CM/SW Contact:    Pollie Friar, RN Phone Number: 01/10/2021, 2:27 PM  Clinical Narrative:                 Pt lives at home with her boyfriend. Pt denies issues with home medications or transportation. Pt has PCP.  TOC following for d/c needs.   Expected Discharge Plan: Home/Self Care Barriers to Discharge: Continued Medical Work up   Patient Goals and CMS Choice        Expected Discharge Plan and Services Expected Discharge Plan: Home/Self Care   Discharge Planning Services: CM Consult   Living arrangements for the past 2 months: Single Family Home                                      Prior Living Arrangements/Services Living arrangements for the past 2 months: Single Family Home   Patient language and need for interpreter reviewed:: Yes          Care giver support system in place?: Yes (comment)   Criminal Activity/Legal Involvement Pertinent to Current Situation/Hospitalization: No - Comment as needed  Activities of Daily Living   ADL Screening (condition at time of admission) Patient's cognitive ability adequate to safely complete daily activities?: Yes Is the patient deaf or have difficulty hearing?: No Does the patient have difficulty seeing, even when wearing glasses/contacts?: Yes (glasses) Does the patient have difficulty concentrating, remembering, or making decisions?: No Patient able to express need for assistance with ADLs?: Yes Does the patient have difficulty dressing or bathing?: No Independently performs ADLs?: Yes (appropriate for developmental age) Does the patient have difficulty walking or climbing stairs?: No  Permission Sought/Granted                  Emotional Assessment Appearance:: Appears stated age Attitude/Demeanor/Rapport: Engaged Affect (typically  observed): Accepting Orientation: : Oriented to Self,Oriented to Place,Oriented to  Time,Oriented to Situation Alcohol / Substance Use: Alcohol Use Psych Involvement: No (comment)  Admission diagnosis:  TIA (transient ischemic attack) [G45.9] Slurred speech [R47.81] Acute CVA (cerebrovascular accident) [facility]) [I63.9] Patient Active Problem List   Diagnosis Date Noted  . Normocytic anemia 01/08/2021  . Acute CVA (cerebrovascular accident) ([place]) 01/07/2021  . Pre-diabetes 01/07/2021  . Essential hypertension 01/06/2021  . Hyperlipidemia 01/06/2021  . Hypothyroidism 01/06/2021  . Genetic testing 01/28/2018  . S/P total knee replacement 01/04/2018  . Family history of breast cancer   . 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 Aneurysm of splenic artery ([facility]) 08/23/2012   PCP:  Cari Caraway, MD Pharmacy:   [id number] [place], [place] [address] Phone: [phone] Fax: [phone]     Social Determinants of Health (SDOH) Interventions    Readmission Risk Interventions No flowsheet data found.

## 2021-01-10 NOTE — Progress Notes (Signed)
PROGRESS NOTE    Crystal Trevino  CXK:[id number] DOB: 04/29/1941 DOA: 01/06/2021 PCP: Cari Caraway, MD    Brief Narrative:  80 y.o. female with a PMH of hypertension, hyperlipidemia, hypothyroidism, right breast cancer s/p lumpectomy and adjuvant radiation currently on tamoxifen who presented to the ED via EMS for evaluation of dysarthria and right facial droop. Symptoms had resolved on arrival to ED. In the ED, VSS. Labs showed WBC 5.0, hemoglobin 10.3, platelets 190,000, sodium 137, potassium 4.0, bicarb 23, BUN 19, creatinine 0.8, serum glucose 141, LFT within normal limits. SARS-CoV-2 PCR is negative. CT head without contrast was negative for evidence of acute intracranial abnormality. CTA head/neck are negative for evidence of large vessel occlusion or proximal hemodynamically significant stenosis in the head or neck. Neurology was consulted and recommended further stroke work-up. Patient was given aspirin 324 mg once and the hospitalist service was consulted to admit for further evaluation and management. Stroke work up completed, PT recommends CIR.  Will need a loop recorder prior to discharge.  Assessment & Plan:   Principal Problem:   Acute CVA (cerebrovascular accident) ([place]) Active Problems:   Ductal carcinoma in situ (DCIS) of right breast   Essential hypertension   Hyperlipidemia   Hypothyroidism   Pre-diabetes   Normocytic anemia  Principal Problem: Acute CVA with aphasia, mild cognitive impairment, right inattention/neglect/dysarthria/right facial droop, POA CT head and CTA head/neck negative.MRI showed acute stroke affecting the left frontal operculum and left parietal lobe. Neurology following. Received aspirin 325 mg in the ED. Full stroke work up in progress.  Echo showed EF 60-65%, grade II diastolic dysfunction. No embolic source. Loop recorder to rule out PAF planned. Continue ASA/Plavix x 21 days, then plavix alone. Lower BP slowly, resume Coreg today.Total  cholesterol 154, LDL 68.  Hgb A1c 6.0% (suggestive of pre-diabetes). Neurologist ordered a repeat CT given nausea/vomiting/headache to rule out bleed: CT was noted to be unremarkabel. PT/OT following. Consideration for CIR, referral made, still awaiting bed availability, but also possible search for other IRF's  Active Problems: Normocytic anemia, POA Mild, hemoglobin had remained stable  Hypertension, chronic, POA Coreg and telmisartan held on admission to allow permissive hypertension. Slowly normalize BP.  continued on Coreg.  Hyperlipidemia, chronic Continue Zocor, LDL noted to be at goal.  Hypothyroidism, chronic Continue levothyroxine. TSH 3.265, good control.  H/O Ductal carcinoma in situ right breast  S/P lumpectomy and adjuvant radiation. Has been on tamoxifen for almost 3 years out of planned5-year course. Tamoxifen was held due to association with thromboembolic stroke. F/U with oncologist as an outpatient for recommendations.  Pre-diabetes, POA Hgb A1c consistent with pre-diabetes.  Dietician consulted to provide diet education. Counseled patient on these findings.   Body mass index is 28.04 kg/m.  DVT prophylaxis: Lovenox subq Code Status: Parital Family Communication: Pt in room, family at bedside  Status is: Inpatient  Remains inpatient appropriate because:Unsafe d/c plan   Dispo: The patient is from: Home              Anticipated d/c is to: CIR              Anticipated d/c date is: 2 days              Patient currently is not medically stable to d/c.   Difficult to place patient No       Consultants:   CIR  Neurology  Procedures:     Antimicrobials: Anti-infectives (From admission, onward)   None  Subjective: More awake today, seen while pt eating lunch  Objective: Vitals:   01/09/21 2348 01/10/21 0325 01/10/21 0757 01/10/21 1630  BP: (!) 146/65 (!) 142/61 129/67 (!) 145/66  Pulse: 79 79 75 92  Resp: 18 18 16    Temp:  98.1 F (36.7 C) 98 F (36.7 C) 98.1 F (36.7 C) 98.2 F (36.8 C)  TempSrc: Oral Oral Oral Oral  SpO2: 98% 100% 100% 99%  Weight:      Height:       No intake or output data in the 24 hours ending 01/10/21 1706 Filed Weights   01/06/21 1600 01/06/21 1734  Weight: 74.8 kg 78.8 kg    Examination: General exam: Awake, laying in bed, in nad Respiratory system: Normal respiratory effort, no wheezing Cardiovascular system: regular rate, s1, s2 Gastrointestinal system: Soft, nondistended, positive BS Central nervous system: CN2-12 grossly intact, strength intact Extremities: Perfused, no clubbing Skin: Normal skin turgor, no notable skin lesions seen Psychiatry: Mood normal // no visual hallucinations   Data Reviewed: I have personally reviewed following labs and imaging studies  CBC: Recent Labs  Lab 01/06/21 1652 01/06/21 1654 01/07/21 0356  WBC 5.0  --  6.1  NEUTROABS 3.0  --   --   HGB 10.3* 11.2* 10.2*  HCT 32.5* 33.0* 32.3*  MCV 97.0  --  95.6  PLT 190  --  354   Basic Metabolic Panel: Recent Labs  Lab 01/06/21 1652 01/06/21 1654 01/07/21 0356 01/08/21 0044  NA 137 137 139 138  K 4.0 4.0 4.4 4.0  CL 103 103 105 103  CO2 23  --  24 25  GLUCOSE 141* 130* 125* 126*  BUN 19 22 13 12   CREATININE 0.88 0.70 0.83 0.80  CALCIUM 9.0  --  8.8* 8.7*   GFR: Estimated Creatinine Clearance: 61.4 mL/min (by C-G formula based on SCr of 0.8 mg/dL). Liver Function Tests: Recent Labs  Lab 01/06/21 1652  AST 18  ALT 11  ALKPHOS 52  BILITOT 0.6  PROT 6.1*  ALBUMIN 3.4*   No results for input(s): LIPASE, AMYLASE in the last 168 hours. No results for input(s): AMMONIA in the last 168 hours. Coagulation Profile: Recent Labs  Lab 01/06/21 1652  INR 0.9   Cardiac Enzymes: No results for input(s): CKTOTAL, CKMB, CKMBINDEX, TROPONINI in the last 168 hours. BNP (last 3 results) No results for input(s): PROBNP in the last 8760 hours. HbA1C: No results for input(s):  HGBA1C in the last 72 hours. CBG: Recent Labs  Lab 01/06/21 1647  GLUCAP 123*   Lipid Profile: No results for input(s): CHOL, HDL, LDLCALC, TRIG, CHOLHDL, LDLDIRECT in the last 72 hours. Thyroid Function Tests: No results for input(s): TSH, T4TOTAL, FREET4, T3FREE, THYROIDAB in the last 72 hours. Anemia Panel: No results for input(s): VITAMINB12, FOLATE, FERRITIN, TIBC, IRON, RETICCTPCT in the last 72 hours. Sepsis Labs: No results for input(s): PROCALCITON, LATICACIDVEN in the last 168 hours.  Recent Results (from the past 240 hour(s))  SARS Coronavirus 2 by RT PCR (hospital order, performed in [facility] hospital lab) Nasopharyngeal Nasopharyngeal Swab     Status: None   Collection Time: 01/06/21  4:45 PM   Specimen: Nasopharyngeal Swab  Result Value Ref Range Status   SARS Coronavirus 2 NEGATIVE NEGATIVE Final    Comment: (NOTE) SARS-CoV-2 target nucleic acids are NOT DETECTED.  The SARS-CoV-2 RNA is generally detectable in upper and lower respiratory specimens during the acute phase of infection. The lowest concentration of SARS-CoV-2 viral  copies this assay can detect is 250 copies / mL. A negative result does not preclude SARS-CoV-2 infection and should not be used as the sole basis for treatment or other patient management decisions.  A negative result may occur with improper specimen collection / handling, submission of specimen other than nasopharyngeal swab, presence of viral mutation(s) within the areas targeted by this assay, and inadequate number of viral copies (<250 copies / mL). A negative result must be combined with clinical observations, patient history, and epidemiological information.  Fact Sheet for Patients:   StrictlyIdeas.no  Fact Sheet for Healthcare Providers: BankingDealers.co.za  This test is not yet approved or  cleared by the [place] FDA and has been authorized for detection and/or  diagnosis of SARS-CoV-2 by FDA under an Emergency Use Authorization (EUA).  This EUA will remain in effect (meaning this test can be used) for the duration of the COVID-19 declaration under Section 564(b)(1) of the Act, 21 U.S.C. section 360bbb-3(b)(1), unless the authorization is terminated or revoked sooner.  Performed at [facility], [place] [address], [place], [place] [address]      Radiology Studies: EP PPM/ICD IMPLANT  Result Date: 01/10/2021 SURGEON:  Allegra Lai, MD   PREPROCEDURE DIAGNOSIS:  Cryptogenic Stroke   POSTPROCEDURE DIAGNOSIS:  Cryptogenic Stroke    PROCEDURES:  1. Implantable loop recorder implantation   INTRODUCTION:  DEBBIE YEARICK is a 80 y.o. female with a history of unexplained stroke who presents today for implantable loop implantation.  The patient has had a cryptogenic stroke.  Despite an extensive workup by neurology, no reversible causes have been identified.  she has worn telemetry during which she did not have arrhythmias.  There is significant concern for possible atrial fibrillation as the cause for the patients stroke.  The patient therefore presents today for implantable loop implantation.   DESCRIPTION OF PROCEDURE:  Informed written consent was obtained, and the patient was brought to the electrophysiology lab in a fasting state.  The patient required no sedation for the procedure today.  Mapping over the patient's chest was performed by the EP lab staff to identify the area where electrograms were most prominent for ILR recording.  This area was found to be the left parasternal region over the 3rd-4th intercostal space. The patients left chest was therefore prepped and draped in the usual sterile fashion by the EP lab staff. The skin overlying the left parasternal region was infiltrated with lidocaine for local analgesia.  A 0.5-cm incision was made over the left parasternal region over the 3rd intercostal space.  A subcutaneous ILR pocket was fashioned  using a combination of sharp and blunt dissection.  A Medtronic Reveal Linq model [place] [id number] implantable loop recorder was then placed into the pocket  R waves were very prominent and measured 0.76mV. EBL<1 ml.  Steri- Strips and a sterile dressing were then applied.  There were no early apparent complications.   CONCLUSIONS:  1. Successful implantation of a Medtronic Reveal LINQ implantable loop recorder for cryptogenic stroke  2. No early apparent complications.    Scheduled Meds: .  stroke: mapping our early stages of recovery book   Does not apply Once  . aspirin EC  81 mg Oral Daily  . carvedilol  12.5 mg Oral BID WC  . clopidogrel  75 mg Oral Daily  . enoxaparin (LOVENOX) injection  40 mg Subcutaneous Q24H  . levothyroxine  75 mcg Oral QAC breakfast  . simvastatin  20 mg Oral q1800  Continuous Infusions:   LOS: 3 days   Marylu Lund, MD [facility] Pager On Amion  If 7PM-7AM, please contact night-coverage 01/10/2021, 5:06 PM

## 2021-01-10 NOTE — Consult Note (Addendum)
ELECTROPHYSIOLOGY CONSULT NOTE  Patient ID: Crystal Trevino MRN: [id number], DOB/AGE: 07-31-1941   Admit date: 01/06/2021 Date of Consult: 01/10/2021  Primary Physician: Cari Caraway, MD Primary Cardiologist: No primary care provider on file.  Primary Electrophysiologist: New to Dr. Curt Bears Reason for Consultation: Cryptogenic stroke; recommendations regarding Implantable Loop Recorder Insurance: Medicare  History of Present Illness EP has been asked to evaluate Crystal Trevino for placement of an implantable loop recorder to monitor for atrial fibrillation by Dr Erlinda Hong.  The patient was admitted on 01/06/2021 with slurred speech, word finding difficulty, .    Imaging demonstrated left MCA infarcts embolic secondary to unknown source.    She has undergone workup for stroke:  Code Stroke CT head No acute abnormality. ASPECTS 10.    CTA head & neck no LVO MRI  L frontal opercular and L parietal infarct; distal L MCA M3 occlusion   Repeat CT head  (01/07/21) showed acute infarct of left frontal lobe with developing hypodensity but no hemorrhage. LE Doppler  No DVT 2D Echo- LVEF 60 to 65%, mild to moderate AVR and mild MVR  Her symptoms included aphasia, mild cognitive impairment, right inattention/neglect/dysarthria/right facial droop.  The patient has been monitored on telemetry which has demonstrated sinus rhythm with no arrhythmias.  Inpatient stroke work-up Daking Westervelt not require a TEE per Neurology.   Echocardiogram as above. Lab work is reviewed.  Prior to admission, the patient denies chest pain, shortness of breath, dizziness, palpitations, or syncope.  She is recovering from her stroke with plans to attend CIR  at discharge. She still has mild R sided sensory neglect and slurred speech.  Past Medical History:  Diagnosis Date   Arthritis    Breast CA ([place])    right breast cancer   Cancer ([place])    HAD RIGHT BREAST BX --GOING [facility] 12/30/2017   Constipation  08/13/2011   Dizziness 08/13/2011   Family history of adverse reaction to anesthesia    pt mother had PONV   Family history of breast cancer    Genetic testing 01/28/2018   Multi-Cancer panel (83 genes) @ [facility] - No pathogenic mutations detected   GERD (gastroesophageal reflux disease)    Heart murmur    "SLIGHT HEART MURMUR"  WAS PICKED UP AS AN ADULT   Hyperlipidemia    Hypertension    Hypothyroidism    Osteoarthritis of knee    Right   Peripheral vascular disease ([place])    Reflux    Splenic artery aneurysm ([facility])    stable 1.5cm by 08/2013 CT; no further surveillance recommended   Weight loss      Surgical History:  Past Surgical History:  Procedure Laterality Date   ABDOMINAL HYSTERECTOMY     BACK SURGERY     BREAST LUMPECTOMY WITH RADIOACTIVE SEED LOCALIZATION Right 02/19/2018   Procedure: BREAST LUMPECTOMY WITH RADIOACTIVE SEED LOCALIZATION. RIGHT;  Surgeon: Alphonsa Overall, MD;  Location: [facility];  Service: General;  Laterality: Right;   BREAST SURGERY     CHOLECYSTECTOMY     KNEE SURGERY     LUMBAR LAMINECTOMY     TOTAL KNEE ARTHROPLASTY Right 01/04/2018   TOTAL KNEE ARTHROPLASTY Right 01/04/2018   Procedure: TOTAL KNEE ARTHROPLASTY;  Surgeon: Vickey Huger, MD;  Location: [facility];  Service: Orthopedics;  Laterality: Right;   VAGINAL PROLAPSE REPAIR     2015   DONE IN [facility]   VAGINAL PROLAPSE REPAIR  05/20/2018     Medications Prior  to Admission  Medication Sig Dispense Refill Last Dose   aspirin EC 81 MG tablet Take 81 mg by mouth daily.   01/06/2021 at Unknown time   carvedilol (COREG) 12.5 MG tablet Take 12.5 mg by mouth 2 (two) times daily with a meal.   01/06/2021 at 0600   fluticasone (FLONASE) 50 MCG/ACT nasal spray Place 1 spray into both nostrils daily as needed for allergies or rhinitis.   Past Month at Unknown time   gabapentin (NEURONTIN) 300 MG capsule Take 1 capsule (300 mg total) by mouth at bedtime. 90 capsule 4 01/05/2021 at Unknown time   levothyroxine  (SYNTHROID, LEVOTHROID) 75 MCG tablet Take 75 mcg by mouth daily before breakfast.   01/06/2021 at Unknown time   Multiple Vitamins-Minerals (CENTRUM SILVER) tablet Take 1 tablet by mouth daily.   01/06/2021 at Unknown time   simvastatin (ZOCOR) 20 MG tablet Take 20 mg by mouth at bedtime.   01/05/2021 at Unknown time   tamoxifen (NOLVADEX) 20 MG tablet Take 1 tablet (20 mg total) by mouth daily. 90 tablet 4 01/06/2021 at Unknown time   telmisartan (MICARDIS) 20 MG tablet Take 20 mg by mouth daily.   01/06/2021 at Unknown time   venlafaxine (EFFEXOR) 75 MG tablet Take 1 tablet (75 mg total) by mouth 2 (two) times daily. 120 tablet 6 01/06/2021 at Unknown time    Inpatient Medications:    stroke: mapping our early stages of recovery book   Does not apply Once   aspirin EC  81 mg Oral Daily   carvedilol  12.5 mg Oral BID WC   clopidogrel  75 mg Oral Daily   enoxaparin (LOVENOX) injection  40 mg Subcutaneous Q24H   levothyroxine  75 mcg Oral QAC breakfast   simvastatin  20 mg Oral q1800    Allergies:  Allergies  Allergen Reactions   Penicillins Nausea And Vomiting, Rash and Other (See Comments)    PATIENT HAS HAD A PCN REACTION WITH IMMEDIATE RASH, FACIAL/TONGUE/THROAT SWELLING, SOB, OR LIGHTHEADEDNESS WITH HYPOTENSION:  #  #  #  YES  #  #  #   Has patient had a PCN reaction causing severe rash involving mucus membranes or skin necrosis: No Has patient had a PCN reaction that required hospitalization: No Has patient had a PCN reaction occurring within the last 10 years: No     Social History   Socioeconomic History   Marital status: Legally Separated    Spouse name: Not on file   Number of children: Not on file   Years of education: Not on file   Highest education level: Not on file  Occupational History   Not on file  Tobacco Use   Smoking status: Never Smoker   Smokeless tobacco: Never Used  Vaping Use   Vaping Use: Never used  Substance and Sexual Activity   Alcohol use: No   Drug  use: No   Sexual activity: Not on file  Other Topics Concern   Not on file  Social History Narrative   Not on file   Social Determinants of Health   Financial Resource Strain: Not on file  Food Insecurity: Not on file  Transportation Needs: Not on file  Physical Activity: Not on file  Stress: Not on file  Social Connections: Not on file  Intimate Partner Violence: Not on file     Family History  Problem Relation Age of Onset   Heart disease Mother    Breast cancer Mother 46  deceased 24; TAH/BSO (age?)   Heart disease Father    Breast cancer Daughter        dx 59s; currenlty 85; Neg BRCA1/BRCA2   Breast cancer Maternal Aunt 44       deceased 25      Review of Systems: All other systems reviewed and are otherwise negative except as noted above.  Physical Exam: Vitals:   01/09/21 2019 01/09/21 2348 01/10/21 0325 01/10/21 0757  BP: 134/67 (!) 146/65 (!) 142/61 129/67  Pulse: 84 79 79 75  Resp: $Remo'18 18 18 16  'VfSNb$ Temp: 98.3 F (36.8 C) 98.1 F (36.7 C) 98 F (36.7 C) 98.1 F (36.7 C)  TempSrc: Oral Oral Oral Oral  SpO2: 98% 98% 100% 100%  Weight:      Height:        GEN- The patient is well appearing, alert and oriented x 3 today.   Head- normocephalic, atraumatic Eyes-  Sclera clear, conjunctiva pink Ears- hearing intact Oropharynx- clear Neck- supple Lungs- Clear to ausculation bilaterally, normal work of breathing Heart- Regular rate and rhythm, no murmurs, rubs or gallops  GI- soft, NT, ND, + BS Extremities- no clubbing, cyanosis, or edema MS- no significant deformity or atrophy Skin- no rash or lesion Psych- euthymic mood, full affect   Labs:   Lab Results  Component Value Date   WBC 6.1 01/07/2021   HGB 10.2 (L) 01/07/2021   HCT 32.3 (L) 01/07/2021   MCV 95.6 01/07/2021   PLT 195 01/07/2021    Recent Labs  Lab 01/06/21 1652 01/06/21 1654 01/08/21 0044  NA 137   < > 138  K 4.0   < > 4.0  CL 103   < > 103  CO2 23   < > 25  BUN 19   <  > 12  CREATININE 0.88   < > 0.80  CALCIUM 9.0   < > 8.7*  PROT 6.1*  --   --   BILITOT 0.6  --   --   ALKPHOS 52  --   --   ALT 11  --   --   AST 18  --   --   GLUCOSE 141*   < > 126*   < > = values in this interval not displayed.     Radiology/Studies: CT HEAD WO CONTRAST  Result Date: 01/07/2021 CLINICAL DATA:  Stroke follow-up EXAM: CT HEAD WITHOUT CONTRAST TECHNIQUE: Contiguous axial images were obtained from the base of the skull through the vertex without intravenous contrast. COMPARISON:  CT head and MRI head 01/06/2021 FINDINGS: Brain: 3.5 cm hypodensity left frontal lobe compatible with acute infarct. This shows restricted diffusion on MRI. Mild local mass-effect. No midline shift. No acute hemorrhage. Ventricle size normal.  No other area of acute infarct. Vascular: Negative for hyperdense vessel Skull: Negative Sinuses/Orbits: Mild mucosal edema left maxillary sinus otherwise clear sinuses. Bilateral cataract extraction Other: None IMPRESSION: Acute infarct left frontal lobe with developing hypodensity but no acute hemorrhage. No midline shift. Electronically Signed   By: Franchot Gallo M.D.   On: 01/07/2021 13:23   MR BRAIN WO CONTRAST  Result Date: 01/06/2021 CLINICAL DATA:  Hypertension.  TIA presentation. EXAM: MRI HEAD WITHOUT CONTRAST TECHNIQUE: Multiplanar, multiecho pulse sequences of the brain and surrounding structures were obtained without intravenous contrast. COMPARISON:  CT studies earlier same day FINDINGS: Brain: Diffusion imaging shows a 3.5 cm region of acute infarction in the left frontal operculum. Mild swelling but no evidence of hemorrhage. Second  separate area of acute infarction posterior to that in the left parietal lobe, considerably smaller. No other vascular territory insult. Brainstem and cerebellum are normal. Cerebral hemispheres elsewhere not show evidence of small-vessel disease or other large vessel infarction. No mass, hydrocephalus or extra-axial  collection. Vascular: Major vessels at the base of the brain show flow. Skull and upper cervical spine: Negative Sinuses/Orbits: Clear/normal Other: None IMPRESSION: 1. 3.5 cm region of acute infarction in the left frontal operculum. Mild swelling but no hemorrhage. Findings consistent with distal vessel left MCA occlusion, at least M3. 2. Second separate area of acute infarction posterior to that in the left parietal lobe, considerably smaller. This is also consistent with a distal branch vessel left MCA occlusion. Electronically Signed   By: Nelson Chimes M.D.   On: 01/06/2021 22:08   ECHOCARDIOGRAM COMPLETE  Result Date: 01/07/2021    ECHOCARDIOGRAM REPORT   Patient Name:   Crystal Trevino Date of Exam: 01/07/2021 Medical Rec #:  [id number]        Height:       66.0 in Accession #:    [id number]       Weight:       173.7 lb Date of Birth:  June 09, 1941         BSA:          1.884 m Patient Age:    45 years         BP:           129/100 mmHg Patient Gender: F                HR:           77 bpm. Exam Location:  Inpatient Procedure: 2D Echo, 3D Echo, Cardiac Doppler and Color Doppler Indications:    TIA  History:        Patient has no prior history of Echocardiogram examinations.                 Risk Factors:Hypertension and Dyslipidemia. Breast cancer.  Sonographer:    Roseanna Rainbow RDCS Referring Phys: [id number] [place] [place]  Sonographer Comments: Suboptimal parasternal window. IMPRESSIONS  1. Left ventricular ejection fraction, by estimation, is 60 to 65%. The left ventricle has normal function. The left ventricle has no regional wall motion abnormalities. There is mild left ventricular hypertrophy. Left ventricular diastolic parameters are consistent with Grade II diastolic dysfunction (pseudonormalization).  2. Right ventricular systolic function is normal. The right ventricular size is normal. There is normal pulmonary artery systolic pressure. The estimated right ventricular systolic pressure is 65.7 mmHg.  3. The  mitral valve is normal in structure. Mild mitral valve regurgitation. No evidence of mitral stenosis.  4. The aortic valve is tricuspid. Aortic valve regurgitation is mild to moderate. Mild aortic valve sclerosis is present, with no evidence of aortic valve stenosis.  5. The inferior vena cava is normal in size with greater than 50% respiratory variability, suggesting right atrial pressure of 3 mmHg. FINDINGS  Left Ventricle: Left ventricular ejection fraction, by estimation, is 60 to 65%. The left ventricle has normal function. The left ventricle has no regional wall motion abnormalities. The left ventricular internal cavity size was normal in size. There is  mild left ventricular hypertrophy. Left ventricular diastolic parameters are consistent with Grade II diastolic dysfunction (pseudonormalization). Right Ventricle: The right ventricular size is normal. No increase in right ventricular wall thickness. Right ventricular systolic function is normal. There is normal pulmonary artery systolic pressure.  The tricuspid regurgitant velocity is 2.46 m/s, and  with an assumed right atrial pressure of 3 mmHg, the estimated right ventricular systolic pressure is 30.0 mmHg. Left Atrium: Left atrial size was normal in size. Right Atrium: Right atrial size was normal in size. Pericardium: There is no evidence of pericardial effusion. Mitral Valve: The mitral valve is normal in structure. There is mild calcification of the mitral valve leaflet(s). Mild mitral annular calcification. Mild mitral valve regurgitation. No evidence of mitral valve stenosis. MV peak gradient, 8.6 mmHg. The mean mitral valve gradient is 3.0 mmHg. Tricuspid Valve: The tricuspid valve is normal in structure. Tricuspid valve regurgitation is trivial. Aortic Valve: The aortic valve is tricuspid. Aortic valve regurgitation is mild to moderate. Aortic regurgitation PHT measures 382 msec. Mild aortic valve sclerosis is present, with no evidence of aortic  valve stenosis. Pulmonic Valve: The pulmonic valve was normal in structure. Pulmonic valve regurgitation is trivial. Aorta: The aortic root is normal in size and structure. Venous: The inferior vena cava is normal in size with greater than 50% respiratory variability, suggesting right atrial pressure of 3 mmHg. IAS/Shunts: No atrial level shunt detected by color flow Doppler.  LEFT VENTRICLE PLAX 2D LVIDd:         3.60 cm      Diastology LVIDs:         2.50 cm      LV e' medial:    6.96 cm/s LV PW:         1.30 cm      LV E/e' medial:  22.3 LV IVS:        1.10 cm      LV e' lateral:   9.79 cm/s LVOT diam:     1.70 cm      LV E/e' lateral: 15.8 LV SV:         45 LV SV Index:   24 LVOT Area:     2.27 cm                              3D Volume EF: LV Volumes (MOD)            3D EF:        61 % LV vol d, MOD A2C: 101.0 ml LV EDV:       143 ml LV vol d, MOD A4C: 66.6 ml  LV ESV:       56 ml LV vol s, MOD A2C: 39.5 ml  LV SV:        87 ml LV vol s, MOD A4C: 26.2 ml LV SV MOD A2C:     61.5 ml LV SV MOD A4C:     66.6 ml LV SV MOD BP:      58.4 ml RIGHT VENTRICLE             IVC RV S prime:     12.00 cm/s  IVC diam: 1.40 cm TAPSE (M-mode): 2.3 cm LEFT ATRIUM             Index       RIGHT ATRIUM           Index LA diam:        3.70 cm 1.96 cm/m  RA Area:     13.70 cm LA Vol (A2C):   29.9 ml 15.87 ml/m RA Volume:   27.60 ml  14.65 ml/m LA Vol (A4C):   41.0 ml 21.77 ml/m  LA Biplane Vol: 35.7 ml 18.95 ml/m  AORTIC VALVE LVOT Vmax:   89.40 cm/s LVOT Vmean:  57.200 cm/s LVOT VTI:    0.199 m AI PHT:      382 msec  AORTA Ao Asc diam: 3.60 cm MITRAL VALVE                TRICUSPID VALVE MV Area (PHT): 3.77 cm     TR Peak grad:   24.2 mmHg MV Area VTI:   1.14 cm     TR Vmax:        246.00 cm/s MV Peak grad:  8.6 mmHg MV Mean grad:  3.0 mmHg     SHUNTS MV Vmax:       1.47 m/s     Systemic VTI:  0.20 m MV Vmean:      76.5 cm/s    Systemic Diam: 1.70 cm MV Decel Time: 201 msec MV E velocity: 155.00 cm/s MV A velocity: 104.00 cm/s  MV E/A ratio:  1.49 Crystal Champagne MD Electronically signed by Crystal Champagne MD Signature Date/Time: 01/07/2021/2:37:34 PM    Final    CT HEAD CODE STROKE WO CONTRAST  Result Date: 01/06/2021 CLINICAL DATA:  Code stroke. Neuro deficit, acute stroke suspected. Slurred speech with right-sided facial droop. EXAM: CT HEAD WITHOUT CONTRAST TECHNIQUE: Contiguous axial images were obtained from the base of the skull through the vertex without intravenous contrast. COMPARISON:  None. FINDINGS: Brain: No evidence of acute large vascular territory infarction, hemorrhage, hydrocephalus, extra-axial collection or mass lesion/mass effect. Mild patchy white matter hypoattenuation, most likely related to chronic microvascular ischemic disease. Vascular: No hyperdense vessel identified. Calcific atherosclerosis. Skull: No acute fracture. Sinuses/Orbits: No acute findings. Other: No mastoid effusions. ASPECTS [facility] Stroke Program Early CT Score) total score (0-10 with 10 being normal): 10. IMPRESSION: 1. No evidence of acute intracranial abnormality. 2. ASPECTS is 10. Code stroke imaging results were communicated on 01/06/2021 at 5:00 pm to provider Dr. Theda Sers via telephone, who verbally acknowledged these results. Electronically Signed   By: Margaretha Sheffield MD   On: 01/06/2021 17:03   VAS [place] LOWER EXTREMITY VENOUS (DVT)  Result Date: 01/07/2021  Lower Venous DVT Study Indications: Stroke.  Comparison Study: No prior study on file Performing Technologist: Sharion Dove RVS  Examination Guidelines: A complete evaluation includes B-mode imaging, spectral Doppler, color Doppler, and power Doppler as needed of all accessible portions of each vessel. Bilateral testing is considered an integral part of a complete examination. Limited examinations for reoccurring indications may be performed as noted. The reflux portion of the exam is performed with the patient in reverse Trendelenburg.   +---------+---------------+---------+-----------+----------+--------------+ RIGHT    CompressibilityPhasicitySpontaneityPropertiesThrombus Aging +---------+---------------+---------+-----------+----------+--------------+ CFV      Full           Yes      Yes                                 +---------+---------------+---------+-----------+----------+--------------+ SFJ      Full                                                        +---------+---------------+---------+-----------+----------+--------------+ FV Prox  Full                                                        +---------+---------------+---------+-----------+----------+--------------+  FV Mid   Full                                                        +---------+---------------+---------+-----------+----------+--------------+ FV DistalFull                                                        +---------+---------------+---------+-----------+----------+--------------+ PFV      Full                                                        +---------+---------------+---------+-----------+----------+--------------+ POP      Full           Yes      Yes                                 +---------+---------------+---------+-----------+----------+--------------+ PTV      Full                                                        +---------+---------------+---------+-----------+----------+--------------+ PERO     Full                                                        +---------+---------------+---------+-----------+----------+--------------+   +---------+---------------+---------+-----------+----------+--------------+ LEFT     CompressibilityPhasicitySpontaneityPropertiesThrombus Aging +---------+---------------+---------+-----------+----------+--------------+ CFV      Full           Yes      Yes                                  +---------+---------------+---------+-----------+----------+--------------+ SFJ      Full                                                        +---------+---------------+---------+-----------+----------+--------------+ FV Prox  Full                                                        +---------+---------------+---------+-----------+----------+--------------+ FV Mid   Full                                                        +---------+---------------+---------+-----------+----------+--------------+  FV DistalFull                                                        +---------+---------------+---------+-----------+----------+--------------+ PFV      Full                                                        +---------+---------------+---------+-----------+----------+--------------+ POP      Full           Yes      Yes                                 +---------+---------------+---------+-----------+----------+--------------+ PTV      Full                                                        +---------+---------------+---------+-----------+----------+--------------+ PERO     Full                                                        +---------+---------------+---------+-----------+----------+--------------+     Summary: BILATERAL: - No evidence of deep vein thrombosis seen in the lower extremities, bilaterally. -   *See table(s) above for measurements and observations. Electronically signed by Jamelle Haring on 01/07/2021 at 5:00:30 PM.    Final    CT ANGIO HEAD CODE STROKE  Result Date: 01/06/2021 CLINICAL DATA:  Stroke/TIA, assess extracranial arteries. EXAM: CT HEAD WITHOUT CONTRAST CT ANGIOGRAPHY OF THE HEAD AND NECK TECHNIQUE: Contiguous axial images were obtained from the base of the skull through the vertex without intravenous contrast. Multidetector CT imaging of the head and neck was performed using the standard protocol during bolus  administration of intravenous contrast. Multiplanar CT image reconstructions and MIPs were obtained to evaluate the vascular anatomy. Carotid stenosis measurements (when applicable) are obtained utilizing NASCET criteria, using the distal internal carotid diameter as the denominator. CONTRAST:  57mL OMNIPAQUE IOHEXOL 350 MG/ML SOLN COMPARISON:  Same day head CT FINDINGS: CTA NECK Aortic arch: Great vessel origins are patent. Right carotid system: No evidence of hemodynamically significant stenosis or dissection. Left carotid system: No evidence of hemodynamically significant stenosis or dissection. Vertebral arteries:Codominant. No evidence of significant stenosis or dissection. Skeleton: Degenerative changes of the cervical spine, greatest at C5-C6 where there is disc height loss, endplate sclerosis and posterior disc osteophyte complex. There is osseous fusion across the C7-T1 vertebral bodies, possibly congenital. Other neck: No evidence of mass or suspicious adenopathy. CTA HEAD Anterior circulation: No evidence of large vessel occlusion, proximal hemodynamically significant stenosis or aneurysm. Mild calcific atherosclerosis of bilateral cavernous carotid arteries. Posterior circulation: No evidence of large vessel occlusion, proximal hemodynamically significant stenosis or aneurysm. Mild stenosis of the intradural left vertebral artery. wMildly dysplastic basilar tip without discrete aneurysm. Venous  sinuses: Within the limitation of arterial timing, no evidence of dural sinus thrombosis. Small left transverse sinus. IMPRESSION: No evidence of large vessel occlusion or proximal hemodynamically significant stenosis in the head or neck. Findings were discussed with Dr. Theda Sers via telephone on 01/06/2021 at 5:00 p.m. Electronically Signed   By: Margaretha Sheffield MD   On: 01/06/2021 18:06   CT ANGIO NECK CODE STROKE  Result Date: 01/06/2021 CLINICAL DATA:  Stroke/TIA, assess extracranial arteries. EXAM: CT  HEAD WITHOUT CONTRAST CT ANGIOGRAPHY OF THE HEAD AND NECK TECHNIQUE: Contiguous axial images were obtained from the base of the skull through the vertex without intravenous contrast. Multidetector CT imaging of the head and neck was performed using the standard protocol during bolus administration of intravenous contrast. Multiplanar CT image reconstructions and MIPs were obtained to evaluate the vascular anatomy. Carotid stenosis measurements (when applicable) are obtained utilizing NASCET criteria, using the distal internal carotid diameter as the denominator. CONTRAST:  1mL OMNIPAQUE IOHEXOL 350 MG/ML SOLN COMPARISON:  Same day head CT FINDINGS: CTA NECK Aortic arch: Great vessel origins are patent. Right carotid system: No evidence of hemodynamically significant stenosis or dissection. Left carotid system: No evidence of hemodynamically significant stenosis or dissection. Vertebral arteries:Codominant. No evidence of significant stenosis or dissection. Skeleton: Degenerative changes of the cervical spine, greatest at C5-C6 where there is disc height loss, endplate sclerosis and posterior disc osteophyte complex. There is osseous fusion across the C7-T1 vertebral bodies, possibly congenital. Other neck: No evidence of mass or suspicious adenopathy. CTA HEAD Anterior circulation: No evidence of large vessel occlusion, proximal hemodynamically significant stenosis or aneurysm. Mild calcific atherosclerosis of bilateral cavernous carotid arteries. Posterior circulation: No evidence of large vessel occlusion, proximal hemodynamically significant stenosis or aneurysm. Mild stenosis of the intradural left vertebral artery. wMildly dysplastic basilar tip without discrete aneurysm. Venous sinuses: Within the limitation of arterial timing, no evidence of dural sinus thrombosis. Small left transverse sinus. IMPRESSION: No evidence of large vessel occlusion or proximal hemodynamically significant stenosis in the head or  neck. Findings were discussed with Dr. Theda Sers via telephone on 01/06/2021 at 5:00 p.m. Electronically Signed   By: Margaretha Sheffield MD   On: 01/06/2021 18:06    12-lead ECG 01/06/21 shows NSR at 86 bpm (personally reviewed) All prior EKG's in EPIC reviewed with no documented atrial fibrillation  Telemetry NSR 80s (personally reviewed)  Assessment and Plan:  1. Cryptogenic stroke The patient presents with cryptogenic stroke.  The patient does not have a TEE planned for this AM.  I spoke at length with the patient about monitoring for afib with an implantable loop recorder.  Risks, benefits, and alteratives to implantable loop recorder were discussed with the patient today.   At this time, the patient is very clear in their decision to proceed with implantable loop recorder.   Wound care was reviewed with the patient (keep incision clean and dry for 3 days).  Wound check scheduled and entered in AVS. Please call with questions.   Shirley Friar, PA-C 01/10/2021 8:20 AM  I have seen and examined this patient with Oda Kilts.  Agree with above, note added to reflect my findings.  On exam, RRR.  Patient presented to the hospital with cryptogenic stroke. To date, no cause has been found. Udell Blasingame plan for LINQ monitor to look for atrial fibrillation. Risks and benefits discussed. Risks include but not limited to bleeding and infection. The patient understands the risks and has agreed to the procedure.  Steffi Noviello M. Gustin Zobrist  MD 01/10/2021 11:07 AM

## 2021-01-10 NOTE — Progress Notes (Signed)
  Speech Language Pathology Treatment: Cognitive-Linquistic  Patient Details Name: Crystal Trevino MRN: [id number] DOB: August 27, 1941 Today's Date: 01/10/2021 Time: 1440-1510 SLP Time Calculation (min) (ACUTE ONLY): 30 min  Assessment / Plan / Recommendation Clinical Impression  SLP followed up for cognitive linguistic intervention. Pts daughter at bedside. Crystal Trevino displays reduced awareness of cognitive deficits post CVA. Right inattention persists. Crystal Trevino consistently unable to independently scan right visual field during functional tasks including reading magazine, visual signs in room, during word search activity. SLP educated regarding compensatory strategies including verbal reminders, use of bright visual information on right side of pages (colorful stimuli), and using patients finger to follow along for visual scanning to the edge of the page. Crystal Trevino required moderate cueing for improving accuracy. Educated Crystal Trevino and daughter regarding cognitively stimulating activities to assist neuro plasticity and Crystal Trevino recovery. Continued ST intervention indicated.    HPI HPI: Crystal Trevino is a 80 yo female admitted secondary to slurred speech. 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 PMHx: R breast cancer s/p lumpectomy and HTN.      SLP Plan  Continue with current plan of care       Recommendations  Diet recommendations: Regular;Thin liquid                General recommendations: Rehab consult Follow up Recommendations: Inpatient Rehab SLP Visit Diagnosis: Attention and concentration deficit;Cognitive communication deficit (R41.841) Attention and concentration deficit following: Cerebral infarction Plan: Continue with current plan of care       GO                Hayden Rasmussen MA, CCC-SLP Acute Rehabilitation Services   01/10/2021, 3:12 PM

## 2021-01-10 NOTE — Progress Notes (Signed)
Inpatient Rehab Admissions Coordinator:   Met with patient and her daughter at the bedside to discuss progress towards CIR.  Pt continues to require min assist for mobility 2/2 R inattention and decreased balance without a device.  Discussed barrier to CIR being bed availability.  Pt/family agreeable to being faxed out to other IRFs.  Spoke to [employer], RN CM, and she will f/u.   Shann Medal, PT, DPT Admissions Coordinator [phone] 01/10/21  2:58 PM

## 2021-01-10 NOTE — Progress Notes (Signed)
Occupational Therapy Treatment Patient Details Name: Crystal Trevino MRN: [id number] DOB: 1941-05-14 Today's Date: 01/10/2021    History of present illness Pt is a 80 yo female admitted secondary to slurred speech. 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 PMHx: R breast cancer s/p lumpectomy and HTN.   OT comments  OT treatment session with focus on self-care re-education, ADL transfers, vision, and patient/family education. Patient currently functioning at [facility] guard to [place] A overall for self-care tasks. Conversation with patient/family on transition to home with [facility] services vs CIR with female family member wanting to continue efforts toward d/c to CIR. Family concerned that [facility] only comes out a few times a week vs. 3hrs of therapy daily in CIR. Female family member also states that prior to this date MD and therapy has told her that the patient is not safe to return home. Education provided on patient CLOF and likelihood of patient progressing beyond need for CIR with progress toward goals. Patient/family expressed verbal understanding. OT will continue to follow acutely.    Follow Up Recommendations  CIR    Equipment Recommendations  3 in 1 bedside commode    Recommendations for Other Services      Precautions / Restrictions Precautions Precautions: Fall Restrictions Weight Bearing Restrictions: No       Mobility Bed Mobility Overal bed mobility: Needs Assistance Bed Mobility: Supine to Sit     Supine to sit: Supervision;HOB elevated     General bed mobility comments: Supervision for safety, use of rails.  Transfers Overall transfer level: Needs assistance Equipment used: None Transfers: Sit to/from Stand Sit to Stand: Min guard Stand pivot transfers: Min guard       General transfer comment: Min guard for sit to stand from EOB and from standard height commode. Min guard for stand-pivot  transfers with RW.    Balance Overall balance assessment: Needs assistance Sitting-balance support: Feet supported;No upper extremity supported Sitting balance-Leahy Scale: Good     Standing balance support: During functional activity Standing balance-Leahy Scale: Fair Standing balance comment: Min guard progressing toward supervision A. Able to complete 3/3 grooming tasks standing at sink surface without UE support.                           ADL either performed or assessed with clinical judgement   ADL Overall ADL's : Needs assistance/impaired     Grooming: Min guard;Standing Grooming Details (indicate cue type and reason): Cues for head turns to R to locate ADL items on sink surface.                 Toilet Transfer: Designer, television/film set Details (indicate cue type and reason): Min guard progressing toward supervision A. Cues for hand placement and walker management. Toileting- Water quality scientist and Hygiene: Min guard Toileting - Clothing Manipulation Details (indicate cue type and reason): Min guard for safety with clothing management in standing. Cues for hand placement.     Functional mobility during ADLs: Min guard;Rolling walker       Vision   Additional Comments: Patient with continued difficulty tracking to R requiring cues for head turns.   Perception     Praxis      Cognition Arousal/Alertness: Awake/alert Behavior During Therapy: Flat affect Overall Cognitive Status: Impaired/Different from baseline Area of Impairment: Following commands;Problem solving  Current Attention Level: Selective   Following Commands: Follows one step commands with increased time   Awareness: Emergent Problem Solving: Decreased initiation;Requires verbal cues General Comments: Patient more alert and A&Ox4 this date. Patient follows 1-step verbal commands with good accuracy.        Exercises Exercises: Other exercises    Shoulder Instructions       General Comments Patient able to maintain both eyes open throughout session this date.    Pertinent Vitals/ Pain       Pain Assessment: No/denies pain  Home Living                                          Prior Functioning/Environment              Frequency  Min 2X/week        Progress Toward Goals  OT Goals(current goals can now be found in the care plan section)  Progress towards OT goals: Progressing toward goals  Acute Rehab OT Goals Patient Stated Goal: To go to CIR OT Goal Formulation: With patient/family Time For Goal Achievement: 01/21/21 Potential to Achieve Goals: Good  Plan Discharge plan remains appropriate;Frequency remains appropriate    Co-evaluation                 AM-PAC OT "6 Clicks" Daily Activity     Outcome Measure   Help from another person eating meals?: None Help from another person taking care of personal grooming?: A Little Help from another person toileting, which includes using toliet, bedpan, or urinal?: A Little Help from another person bathing (including washing, rinsing, drying)?: A Lot Help from another person to put on and taking off regular upper body clothing?: A Little Help from another person to put on and taking off regular lower body clothing?: A Little 6 Click Score: 18    End of Session Equipment Utilized During Treatment: Gait belt;Rolling walker  OT Visit Diagnosis: Unsteadiness on feet (R26.81);Muscle weakness (generalized) (M62.81);Other symptoms and signs involving cognitive function   Activity Tolerance Patient tolerated treatment well   Patient Left in chair;with call bell/phone within reach;with chair alarm set;with family/visitor present (Female family member present at bedside.)   Nurse Communication          Time: 6269-4854 OT Time Calculation (min): 29 min  Charges: OT General Charges $OT Visit: 1 Visit OT Treatments $Self Care/Home  Management : 23-37 mins  Marshea Wisher H. OTR/L Supplemental OT, Department of rehab services [phone]   Nikko Quast R H. 01/10/2021, 11:59 AM

## 2021-01-10 NOTE — TOC Initial Note (Signed)
Transition of Care [facility]) - Initial/Assessment Note    Patient Details  Name: Crystal Trevino MRN: [id number] Date of Birth: Apr 08, 1941  Transition of Care [facility]) CM/SW Contact:    Pollie Friar, RN Phone Number: 01/10/2021, 2:34 PM  Clinical Narrative:                 Recommendations are for CIR. CIR will not have a bed available for this patient for several days. CM met with the patient and her daughter and they are agreeable to having her information sent to [facility]. CM called Anne at [facility] and faxed them the needed information.  TOC following.  Expected Discharge Plan: IP Rehab Facility Barriers to Discharge: Continued Medical Work up   Patient Goals and CMS Choice   CMS Medicare.gov Compare Post Acute Care list provided to:: Patient Choice offered to / list presented to : [place]  Expected Discharge Plan and Services Expected Discharge Plan: [place]   Discharge Planning Services: CM Consult   Living arrangements for the past 2 months: Single Family Home                                      Prior Living Arrangements/Services Living arrangements for the past 2 months: Single Family Home Lives with:: Friends Patient language and need for interpreter reviewed:: Yes Do you feel safe going back to the place where you live?: Yes      Need for Family Participation in Patient Care: Yes (Comment) Care giver support system in place?: Yes (comment)   Criminal Activity/Legal Involvement Pertinent to Current Situation/Hospitalization: No - Comment as needed  Activities of Daily Living   ADL Screening (condition at time of admission) Patient's cognitive ability adequate to safely complete daily activities?: Yes Is the patient deaf or have difficulty hearing?: No Does the patient have difficulty seeing, even when wearing glasses/contacts?: Yes (glasses) Does the patient have difficulty concentrating, remembering, or making decisions?: No Patient  able to express need for assistance with ADLs?: Yes Does the patient have difficulty dressing or bathing?: No Independently performs ADLs?: Yes (appropriate for developmental age) Does the patient have difficulty walking or climbing stairs?: No  Permission Sought/Granted                  Emotional Assessment Appearance:: Appears stated age Attitude/Demeanor/Rapport: Engaged Affect (typically observed): Accepting Orientation: : Oriented to Self,Oriented to Place,Oriented to  Time,Oriented to Situation Alcohol / Substance Use: Never Used Psych Involvement: No (comment)  Admission diagnosis:  TIA (transient ischemic attack) [G45.9] Slurred speech [R47.81] Acute CVA (cerebrovascular accident) [facility]) [I63.9] Patient Active Problem List   Diagnosis Date Noted  . Normocytic anemia 01/08/2021  . Acute CVA (cerebrovascular accident) ([place]) 01/07/2021  . Pre-diabetes 01/07/2021  . Essential hypertension 01/06/2021  . Hyperlipidemia 01/06/2021  . Hypothyroidism 01/06/2021  . Genetic testing 01/28/2018  . S/P total knee replacement 01/04/2018  . Family history of breast cancer   . 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 Aneurysm of splenic artery (HCC) 08/23/2012   PCP:  Cari Caraway, MD Pharmacy:   [id number] [address], [place] [address] Phone: [phone] Fax: [phone]     Social Determinants of Health (SDOH) Interventions    Readmission Risk Interventions No flowsheet data found.

## 2021-01-10 NOTE — Progress Notes (Signed)
Physical Therapy Treatment Patient Details Name: Crystal Trevino MRN: [id number] DOB: Oct 11, 1941 Today's Date: 01/10/2021    History of Present Illness Pt is a 80 yo female admitted secondary to slurred speech. 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 PMHx: R breast cancer s/p lumpectomy and HTN.    PT Comments    Pt is making progress as she was more awake and alert this date as she has been previously keeping her eyes closed with mobility and unaware of this deficit sand safety hazard. She continues to display significant cognitive deficits that impact her safety, such as STM deficits, slow processing, decreased attention span, and poor multi-tasking. Pt tried to enter incorrect room and needed VCs to look at room number, recall her room number, and find it. Pt unable to count down by 3s while ambulating. Pt and pt's daughter educated on performing puzzles, memory card games, and listening to music to attempt to improve cognition. In addition, she has some R side inattention as she needs cues to turn her head and scan for objects on the R at times. Her poor deficits awareness prevents her from recognizing this need for change and that she has R scissoring with gait that places her at risk for falls intermittently. Pt is at risk for falls, indicated by her DGI score of 13 this date when not utilizing an AD. In addition, pt needs min guard-A for mobility on flat surfaces and prefers UE support for stability and minA with UE support to manage a few stairs. Pt has 5 stairs to enter/exit without rails and 13 stairs inside with L rail ascending and R descending to get to her bedroom. Pt is unsafe to negotiate stairs and acknowledge her deficits and maintain her safety alone. Pt's daughter is very motivated to have pt improve as quick as possible with intensive therapies in the CIR setting to maximize her independence and safety  with all functional mobility and decrease her burden of care while maximizing quality of life as pt used to be very sharp minded and independent PTA.  Follow Up Recommendations  CIR (if not approved by CIR then outpatient PT)     Equipment Recommendations  3in1 (PT)    Recommendations for Other Services Rehab consult     Precautions / Restrictions Precautions Precautions: Fall Precaution Comments: R inattention Restrictions Weight Bearing Restrictions: No    Mobility  Bed Mobility Overal bed mobility: Needs Assistance Bed Mobility: Supine to Sit     Supine to sit: Supervision     General bed mobility comments: Supervision for safety with bed flat and no use of rails.    Transfers Overall transfer level: Needs assistance Equipment used: Rolling walker (2 wheeled) Transfers: Sit to/from Stand Sit to Stand: Min guard;From elevated surface Stand pivot transfers: Min guard       General transfer comment: Bed raised to height similar to home set-up for simulation, min guard for safety. Pt initially pulling up to stand on RW and thus cued to try again pushing up from bed, success.  Ambulation/Gait Ambulation/Gait assistance: Min assist Gait Distance (Feet): 200 Feet Assistive device: 1 person hand held assist;Rolling walker (2 wheeled);None Gait Pattern/deviations: Step-through pattern;Decreased stride length;Drifts right/left;Scissoring;Narrow base of support Gait velocity: Decreased Gait velocity interpretation: <1.31 ft/sec, indicative of household ambulator General Gait Details: Initially ambulating with RW, progressing to L hand SPC but poor sequencing thus tried no UE support  with her reaching out for wall rails often, needing cues to not grab wall. Increased unsteadiness without UE support and noted R scissoring on occasion with staggering to maintain balance. Poor attention to R side, needing cues to turn head to scan. Min guard-A for balance and safety. Poor  attention span and unable to multi-task with cognitive task when ambulating.   Stairs Stairs: Yes Stairs assistance: Min assist Stair Management: Two rails;Step to pattern;Alternating pattern Number of Stairs: 6 General stair comments: Pt alternates between reciprocal and step-to pattern, but majority of time displays step-to. Pt prefers bil UEs on rails and when cued to not use rails for home simulation she instead placed weight on therapist through her UEs, needing minA for stability. Poor safety awareness needing repeated cues to turn around at top of stairs to return back down towards PT rather than keep going.   Wheelchair Mobility    Modified Rankin (Stroke Patients Only) Modified Rankin (Stroke Patients Only) Pre-Morbid Rankin Score: No symptoms Modified Rankin: Moderately severe disability     Balance Overall balance assessment: Needs assistance Sitting-balance support: Feet supported;No upper extremity supported Sitting balance-Leahy Scale: Good     Standing balance support: During functional activity Standing balance-Leahy Scale: Fair Standing balance comment: No UE support for mobility, but minA for balance.                 Standardized Balance Assessment Standardized Balance Assessment : Dynamic Gait Index   Dynamic Gait Index Level Surface: Mild Impairment Change in Gait Speed: Moderate Impairment Gait with Horizontal Head Turns: Mild Impairment Gait with Vertical Head Turns: Mild Impairment Gait and Pivot Turn: Mild Impairment Step Over Obstacle: Moderate Impairment Step Around Obstacles: Mild Impairment Steps: Moderate Impairment Total Score: 13      Cognition Arousal/Alertness: Awake/alert Behavior During Therapy: Flat affect Overall Cognitive Status: Impaired/Different from baseline Area of Impairment: Following commands;Safety/judgement;Awareness;Problem solving;Attention;Memory                 Orientation Level:  (not checked this  session) Current Attention Level: Selective Memory: Decreased short-term memory;Decreased recall of precautions Following Commands: Follows one step commands inconsistently;Follows one step commands with increased time Safety/Judgement: Decreased awareness of safety;Decreased awareness of deficits Awareness: Emergent Problem Solving: Decreased initiation;Requires verbal cues;Slow processing General Comments: Pt with slow processing to cues and inconsistently responds correctly to simple cues. Poor awareness of deficits and thus safety as she displays some R side inattention and needs repeated cues to remeber to look that direction. Pt easily distracted and unable to multi-task functional with mental challenges as when cued to count down from 50 by 3s she stated 57 and then would not continue despite encouragement. Pt trying to go into incorrect room and needs cues to find signs and process them to find correct room.      Exercises      General Comments General comments (skin integrity, edema, etc.): Pt and daughter educated on d/c options, use of puzzles games and music for cognition exercises, and seated exercises she can perform safely in room      Pertinent Vitals/Pain Pain Assessment: No/denies pain Pain Intervention(s): Monitored during session    Home Living                      Prior Function            PT Goals (current goals can now be found in the care plan section) Acute Rehab PT Goals Patient Stated Goal: To  go to CIR PT Goal Formulation: With patient Time For Goal Achievement: 01/21/21 Potential to Achieve Goals: Good Progress towards PT goals: Progressing toward goals    Frequency    Min 4X/week      PT Plan Current plan remains appropriate    Co-evaluation              AM-PAC PT "6 Clicks" Mobility   Outcome Measure  Help needed turning from your back to your side while in a flat bed without using bedrails?: None Help needed moving from  lying on your back to sitting on the side of a flat bed without using bedrails?: None Help needed moving to and from a bed to a chair (including a wheelchair)?: A Little Help needed standing up from a chair using your arms (e.g., wheelchair or bedside chair)?: A Little Help needed to walk in hospital room?: A Little Help needed climbing 3-5 steps with a railing? : A Little 6 Click Score: 20    End of Session Equipment Utilized During Treatment: Gait belt Activity Tolerance: Patient tolerated treatment well Patient left: in chair;with call bell/phone within reach;with chair alarm set;with family/visitor present Nurse Communication: Mobility status PT Visit Diagnosis: Unsteadiness on feet (R26.81);Other symptoms and signs involving the nervous system (R29.898);Other abnormalities of gait and mobility (R26.89);Muscle weakness (generalized) (M62.81);Difficulty in walking, not elsewhere classified (R26.2)     Time: 8110-3159 PT Time Calculation (min) (ACUTE ONLY): 38 min  Charges:  $Gait Training: 23-37 mins $Therapeutic Activity: 8-22 mins                     Moishe Spice, PT, DPT Acute Rehabilitation Services  Pager: [phone] Office: [place] 01/10/2021, 12:20 PM

## 2021-01-10 NOTE — Care Management Important Message (Signed)
Important Message  Patient Details  Name: Crystal Trevino MRN: [id number] Date of Birth: 06/30/1941   Medicare Important Message Given:  Yes     Orbie Pyo 01/10/2021, 2:34 PM

## 2021-01-11 DIAGNOSIS — I10 Essential (primary) hypertension: Secondary | ICD-10-CM | POA: Diagnosis not present

## 2021-01-11 DIAGNOSIS — R4781 Slurred speech: Secondary | ICD-10-CM | POA: Diagnosis not present

## 2021-01-11 LAB — RESP PANEL BY RT-PCR (FLU A&B, COVID) ARPGX2
Influenza A by PCR: NEGATIVE
Influenza B by PCR: NEGATIVE
SARS Coronavirus 2 by RT PCR: NEGATIVE

## 2021-01-11 MED ORDER — CLOPIDOGREL BISULFATE 75 MG PO TABS: 75.0000 mg | ORAL_TABLET | Freq: Every day | ORAL | 0 refills | Status: AC

## 2021-01-11 MED ORDER — SENNOSIDES-DOCUSATE SODIUM 8.6-50 MG PO TABS: 1.0000 | ORAL_TABLET | Freq: Every evening | ORAL | 0 refills | Status: DC | PRN

## 2021-01-11 NOTE — Progress Notes (Signed)
PT Cancellation Note  Patient Details Name: Crystal Trevino MRN: [id number] DOB: 13-Nov-1941   Cancelled Treatment:    Reason Eval/Treat Not Completed: Other (comment) (discharging). Upon arrival transportation was arriving to discharge pt from hospital to next venue. If pt does not d/c for any reason then will follow-up another day.  Moishe Spice, PT, DPT Acute Rehabilitation Services  Pager: [phone] Office: [place] 01/11/2021, 1:35 PM

## 2021-01-11 NOTE — Progress Notes (Signed)
Inpatient Rehab Admissions Coordinator:   I will not have a bed for this patient over the weekend.  Will f/u with pt/family next week if they do not receive a bed offer from another facility before then.   Shann Medal, PT, DPT Admissions Coordinator [phone] 01/11/21  10:16 AM

## 2021-01-11 NOTE — TOC Transition Note (Addendum)
Transition of Care [facility]) - CM/SW Discharge Note   Patient Details  Name: Crystal Trevino MRN: [id number] Date of Birth: [birth year]  Transition of Care [facility]) CM/SW Contact:  Pollie Friar, RN Phone Number: 01/11/2021, 12:30 PM   Clinical Narrative:    Pt has been offered a bed at [facility] and they have accepted. D/c summary faxed to [facility] with [facility]. Pt's daughter to provide transportation. Bedside RN updated and d/c packet provided.   Number for report: [phone]/ [phone]   Final next level of care: IP Rehab Facility Barriers to Discharge: No Barriers Identified   Patient Goals and CMS Choice   CMS Medicare.gov Compare Post Acute Care list provided to:: Patient Choice offered to / list presented to : [place]  Discharge Placement                       Discharge Plan and Services   Discharge Planning Services: CM Consult                                 Social Determinants of Health (SDOH) Interventions     Readmission Risk Interventions No flowsheet data found.

## 2021-01-11 NOTE — Discharge Instructions (Signed)
Care After Your Loop Recorder   You have a [employer] Loop Recorder    Monitor your cardiac device site for redness, swelling, and drainage. Call the device clinic at [phone] if you experience these symptoms or fever/chills.   You may shower 3 days after your loop recorder implant and wash your incision with soap and water. Avoid lotions, ointments, or perfumes over your incision until it is well-healed.   You may use a hot tub or a pool AFTER your wound check appointment if the incision is completely closed.   Your device is MRI compatible.    Remote monitoring is used to monitor your cardiac device from home. This monitoring is scheduled every month days by our office. It allows [place] to keep an eye on the functioning of your device to ensure it is working properly.   ================================================================ Continue aspirin plus plavix x 21 days, then plavix alone afterwards

## 2021-01-11 NOTE — Discharge Summary (Addendum)
Physician Discharge Summary  Crystal Trevino MBW:[id number] DOB: 03-30-1941 DOA: 01/06/2021  PCP: Cari Caraway, MD  Admit date: 01/06/2021 Discharge date: 01/11/2021  Admitted From: Home Disposition:  [facility]  Recommendations for Outpatient Follow-up:  1. Follow up with PCP in 1-2 weeks 2. Follow up with Cardiology as scheduled 3. Follow up with Neurology as scheduled 4. Continue DAPT with aspirin and plavix for 21 days and then Plavix alone afterwards  Discharge Condition:Stable CODE STATUS:Partial Diet recommendation: Heart healthy   Brief/Interim Summary: 80 y.o.femalewith a PMH of hypertension, hyperlipidemia, hypothyroidism, right breast cancer s/p lumpectomy and adjuvant radiation currently on tamoxifen who presented to the ED via EMS for evaluation of dysarthria and right facial droop. Symptoms had resolved on arrival to ED. In the ED, VSS. Labs showed WBC 5.0, hemoglobin 10.3, platelets 190,000, sodium 137, potassium 4.0, bicarb 23, BUN 19, creatinine 0.8, serum glucose 141, LFT within normal limits. SARS-CoV-2 PCR is negative. CT head without contrast was negative for evidence of acute intracranial abnormality. CTA head/neck are negative for evidence of large vessel occlusion or proximal hemodynamically significant stenosis in the head or neck. Neurology was consulted and recommended further stroke work-up. Patient was given aspirin 324 mg once and the hospitalist service was consulted to admit for further evaluation and management.Stroke work up completed, PT recommends CIR. Pt is now s/p loop recorder placement 2/10 by Cardiology  Discharge Diagnoses:  Principal Problem:   Acute CVA (cerebrovascular accident) [facility]) Active Problems:   Ductal carcinoma in situ (DCIS) of right breast   Essential hypertension   Hyperlipidemia   Hypothyroidism   Pre-diabetes   Normocytic anemia  Principal Problem: Acute CVA withaphasia, mild cognitive impairment, right  inattention/neglect/dysarthria/right facial droop, POA CT head and CTA head/neck negative.MRI showed acute stroke affecting the left frontal operculum and left parietal lobe.Neurology following.Received aspirin 325 mg in the ED. Full stroke work up in progress. Echo showed EF 60-65%, grade II diastolic dysfunction. No embolic source. Loop recorder to rule out PAFplanned. Continue ASA/Plavixx 21 days, then plavix alone.Lower BP slowly, resume Coreg today.Total cholesterol 154, LDL 68. Hgb A1c 6.0% (suggestive of pre-diabetes). Neurologist ordered a repeat CT given nausea/vomiting/headache to rule out bleed:CT was noted to be unremarkabel.PT/OT following. Recommendation for inpatient rehab. Pt stable for d/c to HPIR today  Active Problems: Normocytic anemia, POA Mild, hemoglobin had remained stable  Hypertension, chronic, POA Coreg and telmisartanheld on admissionto allow permissive hypertension. Slowly normalize BP. Continued on Coreg monotherapy with good control.   Hyperlipidemia, chronic Continue Zocor, LDL noted to be at goal.  Hypothyroidism, chronic Continue levothyroxine. TSH 3.265, good control.  H/O Ductal carcinoma in situ right breast  S/P lumpectomy and adjuvant radiation. Has been on tamoxifen for almost 3 years out of planned5-year course.Tamoxifen was helddue to association with thromboembolic stroke. F/U with oncologist as an outpatient for recommendations.  Pre-diabetes, POA Hgb A1c consistent with pre-diabetes.Dietician consulted to provide diet education.Counseled patient on these findings. Body mass index is 28.04 kg/m.   Discharge Instructions  Discharge Instructions    Ambulatory referral to Neurology   Complete by: As directed    Follow up with stroke clinic NP (Jessica Vanschaick or [name], if both not available, consider Zachery Dauer, or Ahern) at [facility] in about 4 weeks. Thanks.     Allergies as of 01/11/2021       Reactions   Penicillins Nausea And Vomiting, Rash, Other (See Comments)   PATIENT HAS HAD A PCN REACTION WITH IMMEDIATE RASH, FACIAL/TONGUE/THROAT SWELLING, SOB, OR  LIGHTHEADEDNESS WITH HYPOTENSION:  #  #  #  YES  #  #  #   Has patient had a PCN reaction causing severe rash involving mucus membranes or skin necrosis: No Has patient had a PCN reaction that required hospitalization: No Has patient had a PCN reaction occurring within the last 10 years: No      Medication List    STOP taking these medications   tamoxifen 20 MG tablet Commonly known as: NOLVADEX   telmisartan 20 MG tablet Commonly known as: MICARDIS   venlafaxine 75 MG tablet Commonly known as: EFFEXOR     TAKE these medications   aspirin EC 81 MG tablet Take 81 mg by mouth daily.   carvedilol 12.5 MG tablet Commonly known as: COREG Take 12.5 mg by mouth 2 (two) times daily with a meal.   Centrum Silver tablet Take 1 tablet by mouth daily.   clopidogrel 75 MG tablet Commonly known as: PLAVIX Take 1 tablet (75 mg total) by mouth daily for 21 days. Start taking on: January 12, 2021   fluticasone 50 MCG/ACT nasal spray Commonly known as: FLONASE Place 1 spray into both nostrils daily as needed for allergies or rhinitis.   gabapentin 300 MG capsule Commonly known as: NEURONTIN Take 1 capsule (300 mg total) by mouth at bedtime.   levothyroxine 75 MCG tablet Commonly known as: SYNTHROID Take 75 mcg by mouth daily before breakfast.   senna-docusate 8.6-50 MG tablet Commonly known as: Senokot-S Take 1 tablet by mouth at bedtime as needed for mild constipation.   simvastatin 20 MG tablet Commonly known as: ZOCOR Take 20 mg by mouth at bedtime.       Follow-up Information    [facility]. Schedule an appointment as soon as possible for a visit in 4 week(s).   Specialty: Neurology Contact information: [facility].   Why: On 2/22 at 1040 for post loop recorder wound check Contact information: [place] [address] [phone]       Cari Caraway, MD. Schedule an appointment as soon as possible for a visit in 2 week(s).   Specialty: Family Medicine Contact information: [place] [address] [phone]              Allergies  Allergen Reactions  . Penicillins Nausea And Vomiting, Rash and Other (See Comments)    PATIENT HAS HAD A PCN REACTION WITH IMMEDIATE RASH, FACIAL/TONGUE/THROAT SWELLING, SOB, OR LIGHTHEADEDNESS WITH HYPOTENSION:  #  #  #  YES  #  #  #   Has patient had a PCN reaction causing severe rash involving mucus membranes or skin necrosis: No Has patient had a PCN reaction that required hospitalization: No Has patient had a PCN reaction occurring within the last 10 years: No     Consultations:  Neurology  Cardiology  Procedures/Studies: CT HEAD WO CONTRAST  Result Date: 01/07/2021 CLINICAL DATA:  Stroke follow-up EXAM: CT HEAD WITHOUT CONTRAST TECHNIQUE: Contiguous axial images were obtained from the base of the skull through the vertex without intravenous contrast. COMPARISON:  CT head and MRI head 01/06/2021 FINDINGS: Brain: 3.5 cm hypodensity left frontal lobe compatible with acute infarct. This shows restricted diffusion on MRI. Mild local mass-effect. No midline shift. No acute hemorrhage. Ventricle size normal.  No other area of acute infarct. Vascular: Negative  for hyperdense vessel Skull: Negative Sinuses/Orbits: Mild mucosal edema left maxillary sinus otherwise clear sinuses. Bilateral cataract extraction Other: None IMPRESSION: Acute infarct left frontal lobe with developing hypodensity but no acute hemorrhage. No midline shift. Electronically Signed   By: Franchot Gallo M.D.   On: 01/07/2021 13:23   MR BRAIN WO CONTRAST  Result  Date: 01/06/2021 CLINICAL DATA:  Hypertension.  TIA presentation. EXAM: MRI HEAD WITHOUT CONTRAST TECHNIQUE: Multiplanar, multiecho pulse sequences of the brain and surrounding structures were obtained without intravenous contrast. COMPARISON:  CT studies earlier same day FINDINGS: Brain: Diffusion imaging shows a 3.5 cm region of acute infarction in the left frontal operculum. Mild swelling but no evidence of hemorrhage. Second separate area of acute infarction posterior to that in the left parietal lobe, considerably smaller. No other vascular territory insult. Brainstem and cerebellum are normal. Cerebral hemispheres elsewhere not show evidence of small-vessel disease or other large vessel infarction. No mass, hydrocephalus or extra-axial collection. Vascular: Major vessels at the base of the brain show flow. Skull and upper cervical spine: Negative Sinuses/Orbits: Clear/normal Other: None IMPRESSION: 1. 3.5 cm region of acute infarction in the left frontal operculum. Mild swelling but no hemorrhage. Findings consistent with distal vessel left MCA occlusion, at least M3. 2. Second separate area of acute infarction posterior to that in the left parietal lobe, considerably smaller. This is also consistent with a distal branch vessel left MCA occlusion. Electronically Signed   By: Nelson Chimes M.D.   On: 01/06/2021 22:08   EP PPM/ICD IMPLANT  Result Date: 01/10/2021 SURGEON:  Allegra Lai, MD   PREPROCEDURE DIAGNOSIS:  Cryptogenic Stroke   POSTPROCEDURE DIAGNOSIS:  Cryptogenic Stroke    PROCEDURES:  1. Implantable loop recorder implantation   INTRODUCTION:  LINDI ABRAM is a 80 y.o. female with a history of unexplained stroke who presents today for implantable loop implantation.  The patient has had a cryptogenic stroke.  Despite an extensive workup by neurology, no reversible causes have been identified.  she has worn telemetry during which she did not have arrhythmias.  There is significant concern for  possible atrial fibrillation as the cause for the patients stroke.  The patient therefore presents today for implantable loop implantation.   DESCRIPTION OF PROCEDURE:  Informed written consent was obtained, and the patient was brought to the electrophysiology lab in a fasting state.  The patient required no sedation for the procedure today.  Mapping over the patient's chest was performed by the EP lab staff to identify the area where electrograms were most prominent for ILR recording.  This area was found to be the left parasternal region over the 3rd-4th intercostal space. The patients left chest was therefore prepped and draped in the usual sterile fashion by the EP lab staff. The skin overlying the left parasternal region was infiltrated with lidocaine for local analgesia.  A 0.5-cm incision was made over the left parasternal region over the 3rd intercostal space.  A subcutaneous ILR pocket was fashioned using a combination of sharp and blunt dissection.  A Medtronic Reveal Linq model [place] [id number] implantable loop recorder was then placed into the pocket  R waves were very prominent and measured 0.66mV. EBL<1 ml.  Steri- Strips and a sterile dressing were then applied.  There were no early apparent complications.   CONCLUSIONS:  1. Successful implantation of a Medtronic Reveal LINQ implantable loop recorder for cryptogenic stroke  2. No early apparent complications.   ECHOCARDIOGRAM COMPLETE  Result Date: 01/07/2021  ECHOCARDIOGRAM REPORT   Patient Name:   KENDRAH LOVERN Date of Exam: 01/07/2021 Medical Rec #:  [id number]        Height:       66.0 in Accession #:    [id number]       Weight:       173.7 lb Date of Birth:  1940/12/30         BSA:          1.884 m Patient Age:    4 years         BP:           129/100 mmHg Patient Gender: F                HR:           77 bpm. Exam Location:  Inpatient Procedure: 2D Echo, 3D Echo, Cardiac Doppler and Color Doppler Indications:    TIA  History:         Patient has no prior history of Echocardiogram examinations.                 Risk Factors:Hypertension and Dyslipidemia. Breast cancer.  Sonographer:    Roseanna Rainbow RDCS Referring Phys: [id number] [place]  Sonographer Comments: Suboptimal parasternal window. IMPRESSIONS  1. Left ventricular ejection fraction, by estimation, is 60 to 65%. The left ventricle has normal function. The left ventricle has no regional wall motion abnormalities. There is mild left ventricular hypertrophy. Left ventricular diastolic parameters are consistent with Grade II diastolic dysfunction (pseudonormalization).  2. Right ventricular systolic function is normal. The right ventricular size is normal. There is normal pulmonary artery systolic pressure. The estimated right ventricular systolic pressure is 12.4 mmHg.  3. The mitral valve is normal in structure. Mild mitral valve regurgitation. No evidence of mitral stenosis.  4. The aortic valve is tricuspid. Aortic valve regurgitation is mild to moderate. Mild aortic valve sclerosis is present, with no evidence of aortic valve stenosis.  5. The inferior vena cava is normal in size with greater than 50% respiratory variability, suggesting right atrial pressure of 3 mmHg. FINDINGS  Left Ventricle: Left ventricular ejection fraction, by estimation, is 60 to 65%. The left ventricle has normal function. The left ventricle has no regional wall motion abnormalities. The left ventricular internal cavity size was normal in size. There is  mild left ventricular hypertrophy. Left ventricular diastolic parameters are consistent with Grade II diastolic dysfunction (pseudonormalization). Right Ventricle: The right ventricular size is normal. No increase in right ventricular wall thickness. Right ventricular systolic function is normal. There is normal pulmonary artery systolic pressure. The tricuspid regurgitant velocity is 2.46 m/s, and  with an assumed right atrial pressure of 3 mmHg, the estimated  right ventricular systolic pressure is 58.0 mmHg. Left Atrium: Left atrial size was normal in size. Right Atrium: Right atrial size was normal in size. Pericardium: There is no evidence of pericardial effusion. Mitral Valve: The mitral valve is normal in structure. There is mild calcification of the mitral valve leaflet(s). Mild mitral annular calcification. Mild mitral valve regurgitation. No evidence of mitral valve stenosis. MV peak gradient, 8.6 mmHg. The mean mitral valve gradient is 3.0 mmHg. Tricuspid Valve: The tricuspid valve is normal in structure. Tricuspid valve regurgitation is trivial. Aortic Valve: The aortic valve is tricuspid. Aortic valve regurgitation is mild to moderate. Aortic regurgitation PHT measures 382 msec. Mild aortic valve sclerosis is present, with no evidence of aortic valve stenosis. Pulmonic Valve: The pulmonic  valve was normal in structure. Pulmonic valve regurgitation is trivial. Aorta: The aortic root is normal in size and structure. Venous: The inferior vena cava is normal in size with greater than 50% respiratory variability, suggesting right atrial pressure of 3 mmHg. IAS/Shunts: No atrial level shunt detected by color flow Doppler.  LEFT VENTRICLE PLAX 2D LVIDd:         3.60 cm      Diastology LVIDs:         2.50 cm      LV e' medial:    6.96 cm/s LV PW:         1.30 cm      LV E/e' medial:  22.3 LV IVS:        1.10 cm      LV e' lateral:   9.79 cm/s LVOT diam:     1.70 cm      LV E/e' lateral: 15.8 LV SV:         45 LV SV Index:   24 LVOT Area:     2.27 cm                              3D Volume EF: LV Volumes (MOD)            3D EF:        61 % LV vol d, MOD A2C: 101.0 ml LV EDV:       143 ml LV vol d, MOD A4C: 66.6 ml  LV ESV:       56 ml LV vol s, MOD A2C: 39.5 ml  LV SV:        87 ml LV vol s, MOD A4C: 26.2 ml LV SV MOD A2C:     61.5 ml LV SV MOD A4C:     66.6 ml LV SV MOD BP:      58.4 ml RIGHT VENTRICLE             IVC RV S prime:     12.00 cm/s  IVC diam: 1.40 cm TAPSE  (M-mode): 2.3 cm LEFT ATRIUM             Index       RIGHT ATRIUM           Index LA diam:        3.70 cm 1.96 cm/m  RA Area:     13.70 cm LA Vol (A2C):   29.9 ml 15.87 ml/m RA Volume:   27.60 ml  14.65 ml/m LA Vol (A4C):   41.0 ml 21.77 ml/m LA Biplane Vol: 35.7 ml 18.95 ml/m  AORTIC VALVE LVOT Vmax:   89.40 cm/s LVOT Vmean:  57.200 cm/s LVOT VTI:    0.199 m AI PHT:      382 msec  AORTA Ao Asc diam: 3.60 cm MITRAL VALVE                TRICUSPID VALVE MV Area (PHT): 3.77 cm     TR Peak grad:   24.2 mmHg MV Area VTI:   1.14 cm     TR Vmax:        246.00 cm/s MV Peak grad:  8.6 mmHg MV Mean grad:  3.0 mmHg     SHUNTS MV Vmax:       1.47 m/s     Systemic VTI:  0.20 m MV Vmean:      76.5 cm/s  Systemic Diam: 1.70 cm MV Decel Time: 201 msec MV E velocity: 155.00 cm/s MV A velocity: 104.00 cm/s MV E/A ratio:  1.49 Loralie Champagne MD Electronically signed by Loralie Champagne MD Signature Date/Time: 01/07/2021/2:37:34 PM    Final    CT HEAD CODE STROKE WO CONTRAST  Result Date: 01/06/2021 CLINICAL DATA:  Code stroke. Neuro deficit, acute stroke suspected. Slurred speech with right-sided facial droop. EXAM: CT HEAD WITHOUT CONTRAST TECHNIQUE: Contiguous axial images were obtained from the base of the skull through the vertex without intravenous contrast. COMPARISON:  None. FINDINGS: Brain: No evidence of acute large vascular territory infarction, hemorrhage, hydrocephalus, extra-axial collection or mass lesion/mass effect. Mild patchy white matter hypoattenuation, most likely related to chronic microvascular ischemic disease. Vascular: No hyperdense vessel identified. Calcific atherosclerosis. Skull: No acute fracture. Sinuses/Orbits: No acute findings. Other: No mastoid effusions. ASPECTS [facility] Stroke Program Early CT Score) total score (0-10 with 10 being normal): 10. IMPRESSION: 1. No evidence of acute intracranial abnormality. 2. ASPECTS is 10. Code stroke imaging results were communicated on 01/06/2021 at 5:00 pm  to provider Dr. Theda Sers via telephone, who verbally acknowledged these results. Electronically Signed   By: Margaretha Sheffield MD   On: 01/06/2021 17:03   VAS [place] LOWER EXTREMITY VENOUS (DVT)  Result Date: 01/07/2021  Lower Venous DVT Study Indications: Stroke.  Comparison Study: No prior study on file Performing Technologist: Sharion Dove RVS  Examination Guidelines: A complete evaluation includes B-mode imaging, spectral Doppler, color Doppler, and power Doppler as needed of all accessible portions of each vessel. Bilateral testing is considered an integral part of a complete examination. Limited examinations for reoccurring indications may be performed as noted. The reflux portion of the exam is performed with the patient in reverse Trendelenburg.  +---------+---------------+---------+-----------+----------+--------------+ RIGHT    CompressibilityPhasicitySpontaneityPropertiesThrombus Aging +---------+---------------+---------+-----------+----------+--------------+ CFV      Full           Yes      Yes                                 +---------+---------------+---------+-----------+----------+--------------+ SFJ      Full                                                        +---------+---------------+---------+-----------+----------+--------------+ FV Prox  Full                                                        +---------+---------------+---------+-----------+----------+--------------+ FV Mid   Full                                                        +---------+---------------+---------+-----------+----------+--------------+ FV DistalFull                                                        +---------+---------------+---------+-----------+----------+--------------+  PFV      Full                                                        +---------+---------------+---------+-----------+----------+--------------+ POP      Full           Yes      Yes                                  +---------+---------------+---------+-----------+----------+--------------+ PTV      Full                                                        +---------+---------------+---------+-----------+----------+--------------+ PERO     Full                                                        +---------+---------------+---------+-----------+----------+--------------+   +---------+---------------+---------+-----------+----------+--------------+ LEFT     CompressibilityPhasicitySpontaneityPropertiesThrombus Aging +---------+---------------+---------+-----------+----------+--------------+ CFV      Full           Yes      Yes                                 +---------+---------------+---------+-----------+----------+--------------+ SFJ      Full                                                        +---------+---------------+---------+-----------+----------+--------------+ FV Prox  Full                                                        +---------+---------------+---------+-----------+----------+--------------+ FV Mid   Full                                                        +---------+---------------+---------+-----------+----------+--------------+ FV DistalFull                                                        +---------+---------------+---------+-----------+----------+--------------+ PFV      Full                                                        +---------+---------------+---------+-----------+----------+--------------+  POP      Full           Yes      Yes                                 +---------+---------------+---------+-----------+----------+--------------+ PTV      Full                                                        +---------+---------------+---------+-----------+----------+--------------+ PERO     Full                                                         +---------+---------------+---------+-----------+----------+--------------+     Summary: BILATERAL: - No evidence of deep vein thrombosis seen in the lower extremities, bilaterally. -   *See table(s) above for measurements and observations. Electronically signed by Jamelle Haring on 01/07/2021 at 5:00:30 PM.    Final    CT ANGIO HEAD CODE STROKE  Result Date: 01/06/2021 CLINICAL DATA:  Stroke/TIA, assess extracranial arteries. EXAM: CT HEAD WITHOUT CONTRAST CT ANGIOGRAPHY OF THE HEAD AND NECK TECHNIQUE: Contiguous axial images were obtained from the base of the skull through the vertex without intravenous contrast. Multidetector CT imaging of the head and neck was performed using the standard protocol during bolus administration of intravenous contrast. Multiplanar CT image reconstructions and MIPs were obtained to evaluate the vascular anatomy. Carotid stenosis measurements (when applicable) are obtained utilizing NASCET criteria, using the distal internal carotid diameter as the denominator. CONTRAST:  3mL OMNIPAQUE IOHEXOL 350 MG/ML SOLN COMPARISON:  Same day head CT FINDINGS: CTA NECK Aortic arch: Great vessel origins are patent. Right carotid system: No evidence of hemodynamically significant stenosis or dissection. Left carotid system: No evidence of hemodynamically significant stenosis or dissection. Vertebral arteries:Codominant. No evidence of significant stenosis or dissection. Skeleton: Degenerative changes of the cervical spine, greatest at C5-C6 where there is disc height loss, endplate sclerosis and posterior disc osteophyte complex. There is osseous fusion across the C7-T1 vertebral bodies, possibly congenital. Other neck: No evidence of mass or suspicious adenopathy. CTA HEAD Anterior circulation: No evidence of large vessel occlusion, proximal hemodynamically significant stenosis or aneurysm. Mild calcific atherosclerosis of bilateral cavernous carotid arteries. Posterior circulation: No evidence  of large vessel occlusion, proximal hemodynamically significant stenosis or aneurysm. Mild stenosis of the intradural left vertebral artery. wMildly dysplastic basilar tip without discrete aneurysm. Venous sinuses: Within the limitation of arterial timing, no evidence of dural sinus thrombosis. Small left transverse sinus. IMPRESSION: No evidence of large vessel occlusion or proximal hemodynamically significant stenosis in the head or neck. Findings were discussed with Dr. Theda Sers via telephone on 01/06/2021 at 5:00 p.m. Electronically Signed   By: Margaretha Sheffield MD   On: 01/06/2021 18:06   CT ANGIO NECK CODE STROKE  Result Date: 01/06/2021 CLINICAL DATA:  Stroke/TIA, assess extracranial arteries. EXAM: CT HEAD WITHOUT CONTRAST CT ANGIOGRAPHY OF THE HEAD AND NECK TECHNIQUE: Contiguous axial images were obtained from the base of the skull through the vertex without intravenous contrast. Multidetector CT imaging of the head and neck was performed using  the standard protocol during bolus administration of intravenous contrast. Multiplanar CT image reconstructions and MIPs were obtained to evaluate the vascular anatomy. Carotid stenosis measurements (when applicable) are obtained utilizing NASCET criteria, using the distal internal carotid diameter as the denominator. CONTRAST:  43mL OMNIPAQUE IOHEXOL 350 MG/ML SOLN COMPARISON:  Same day head CT FINDINGS: CTA NECK Aortic arch: Great vessel origins are patent. Right carotid system: No evidence of hemodynamically significant stenosis or dissection. Left carotid system: No evidence of hemodynamically significant stenosis or dissection. Vertebral arteries:Codominant. No evidence of significant stenosis or dissection. Skeleton: Degenerative changes of the cervical spine, greatest at C5-C6 where there is disc height loss, endplate sclerosis and posterior disc osteophyte complex. There is osseous fusion across the C7-T1 vertebral bodies, possibly congenital. Other neck:  No evidence of mass or suspicious adenopathy. CTA HEAD Anterior circulation: No evidence of large vessel occlusion, proximal hemodynamically significant stenosis or aneurysm. Mild calcific atherosclerosis of bilateral cavernous carotid arteries. Posterior circulation: No evidence of large vessel occlusion, proximal hemodynamically significant stenosis or aneurysm. Mild stenosis of the intradural left vertebral artery. wMildly dysplastic basilar tip without discrete aneurysm. Venous sinuses: Within the limitation of arterial timing, no evidence of dural sinus thrombosis. Small left transverse sinus. IMPRESSION: No evidence of large vessel occlusion or proximal hemodynamically significant stenosis in the head or neck. Findings were discussed with Dr. Theda Sers via telephone on 01/06/2021 at 5:00 p.m. Electronically Signed   By: Margaretha Sheffield MD   On: 01/06/2021 18:06    Subjective: Eager to start therapy  Discharge Exam: Vitals:   01/11/21 0818 01/11/21 0856  BP:  124/67  Pulse: 89 86  Resp:  18  Temp:  (!) 97.4 F (36.3 C)  SpO2:  99%   Vitals:   01/10/21 2355 01/11/21 0410 01/11/21 0818 01/11/21 0856  BP: (!) 145/67 118/69  124/67  Pulse: 73 75 89 86  Resp: 16 18  18   Temp: 97.6 F (36.4 C) (!) 97.5 F (36.4 C)  (!) 97.4 F (36.3 C)  TempSrc: Oral Oral  Oral  SpO2: 98% 98%  99%  Weight:      Height:        General: Pt is alert, awake, not in acute distress Cardiovascular: RRR, S1/S2 +, no rubs, no gallops Respiratory: CTA bilaterally, no wheezing, no rhonchi Abdominal: Soft, NT, ND, bowel sounds + Extremities: no edema, no cyanosis   The results of significant diagnostics from this hospitalization (including imaging, microbiology, ancillary and laboratory) are listed below for reference.     Microbiology: Recent Results (from the past 240 hour(s))  SARS Coronavirus 2 by RT PCR (hospital order, performed in [facility] hospital lab) Nasopharyngeal Nasopharyngeal Swab      Status: None   Collection Time: 01/06/21  4:45 PM   Specimen: Nasopharyngeal Swab  Result Value Ref Range Status   SARS Coronavirus 2 NEGATIVE NEGATIVE Final    Comment: (NOTE) SARS-CoV-2 target nucleic acids are NOT DETECTED.  The SARS-CoV-2 RNA is generally detectable in upper and lower respiratory specimens during the acute phase of infection. The lowest concentration of SARS-CoV-2 viral copies this assay can detect is 250 copies / mL. A negative result does not preclude SARS-CoV-2 infection and should not be used as the sole basis for treatment or other patient management decisions.  A negative result may occur with improper specimen collection / handling, submission of specimen other than nasopharyngeal swab, presence of viral mutation(s) within the areas targeted by this assay, and inadequate number of viral  copies (<250 copies / mL). A negative result must be combined with clinical observations, patient history, and epidemiological information.  Fact Sheet for Patients:   StrictlyIdeas.no  Fact Sheet for Healthcare Providers: BankingDealers.co.za  This test is not yet approved or  cleared by the [place] FDA and has been authorized for detection and/or diagnosis of SARS-CoV-2 by FDA under an Emergency Use Authorization (EUA).  This EUA will remain in effect (meaning this test can be used) for the duration of the COVID-19 declaration under Section 564(b)(1) of the Act, 21 U.S.C. section 360bbb-3(b)(1), unless the authorization is terminated or revoked sooner.  Performed at [facility], [place] [address], [place], [place] [address]      Labs: BNP (last 3 results) No results for input(s): BNP in the last 8760 hours. Basic Metabolic Panel: Recent Labs  Lab 01/06/21 1652 01/06/21 1654 01/07/21 0356 01/08/21 0044  NA 137 137 139 138  K 4.0 4.0 4.4 4.0  CL 103 103 105 103  CO2 23  --  24 25  GLUCOSE 141* 130*  125* 126*  BUN 19 22 13 12   CREATININE 0.88 0.70 0.83 0.80  CALCIUM 9.0  --  8.8* 8.7*   Liver Function Tests: Recent Labs  Lab 01/06/21 1652  AST 18  ALT 11  ALKPHOS 52  BILITOT 0.6  PROT 6.1*  ALBUMIN 3.4*   No results for input(s): LIPASE, AMYLASE in the last 168 hours. No results for input(s): AMMONIA in the last 168 hours. CBC: Recent Labs  Lab 01/06/21 1652 01/06/21 1654 01/07/21 0356  WBC 5.0  --  6.1  NEUTROABS 3.0  --   --   HGB 10.3* 11.2* 10.2*  HCT 32.5* 33.0* 32.3*  MCV 97.0  --  95.6  PLT 190  --  195   Cardiac Enzymes: No results for input(s): CKTOTAL, CKMB, CKMBINDEX, TROPONINI in the last 168 hours. BNP: Invalid input(s): POCBNP CBG: Recent Labs  Lab 01/06/21 1647  GLUCAP 123*   D-Dimer No results for input(s): DDIMER in the last 72 hours. Hgb A1c No results for input(s): HGBA1C in the last 72 hours. Lipid Profile No results for input(s): CHOL, HDL, LDLCALC, TRIG, CHOLHDL, LDLDIRECT in the last 72 hours. Thyroid function studies No results for input(s): TSH, T4TOTAL, T3FREE, THYROIDAB in the last 72 hours.  Invalid input(s): FREET3 Anemia work up No results for input(s): VITAMINB12, FOLATE, FERRITIN, TIBC, IRON, RETICCTPCT in the last 72 hours. Urinalysis No results found for: COLORURINE, APPEARANCEUR, [place], [place], [place], [place], [place], [place], PROTEINUR, UROBILINOGEN, NITRITE, LEUKOCYTESUR Sepsis Labs Invalid input(s): PROCALCITONIN,  WBC,  LACTICIDVEN Microbiology Recent Results (from the past 240 hour(s))  SARS Coronavirus 2 by RT PCR (hospital order, performed in [facility] hospital lab) Nasopharyngeal Nasopharyngeal Swab     Status: None   Collection Time: 01/06/21  4:45 PM   Specimen: Nasopharyngeal Swab  Result Value Ref Range Status   SARS Coronavirus 2 NEGATIVE NEGATIVE Final    Comment: (NOTE) SARS-CoV-2 target nucleic acids are NOT DETECTED.  The SARS-CoV-2 RNA is generally detectable in upper and  lower respiratory specimens during the acute phase of infection. The lowest concentration of SARS-CoV-2 viral copies this assay can detect is 250 copies / mL. A negative result does not preclude SARS-CoV-2 infection and should not be used as the sole basis for treatment or other patient management decisions.  A negative result may occur with improper specimen collection / handling, submission of specimen other than nasopharyngeal swab, presence of viral  mutation(s) within the areas targeted by this assay, and inadequate number of viral copies (<250 copies / mL). A negative result must be combined with clinical observations, patient history, and epidemiological information.  Fact Sheet for Patients:   StrictlyIdeas.no  Fact Sheet for Healthcare Providers: BankingDealers.co.za  This test is not yet approved or  cleared by the [place] FDA and has been authorized for detection and/or diagnosis of SARS-CoV-2 by FDA under an Emergency Use Authorization (EUA).  This EUA will remain in effect (meaning this test can be used) for the duration of the COVID-19 declaration under Section 564(b)(1) of the Act, 21 U.S.C. section 360bbb-3(b)(1), unless the authorization is terminated or revoked sooner.  Performed at [facility], [place] [address], [place], [place] [address]    Time spent: 38min  SIGNED:   Marylu Lund, MD  [facility] 01/11/2021, 12:17 PM  If 7PM-7AM, please contact night-coverage

## 2021-01-11 NOTE — Progress Notes (Signed)
Patient ready for discharge to rehab in [facility]; report called to RN at [phone].  Patient's daughter is driving patient to the Rehab in [facility]; discharge packet and AVS given to daughter to give to Rehab; patient discharged out after instructions were given via wheelchair accompanied by daughter.

## 2021-01-22 ENCOUNTER — Ambulatory Visit: Payer: Federal, State, Local not specified - PPO

## 2021-01-23 ENCOUNTER — Ambulatory Visit: Payer: Federal, State, Local not specified - PPO | Attending: Internal Medicine

## 2021-01-23 ENCOUNTER — Other Ambulatory Visit: Payer: Self-pay

## 2021-01-23 ENCOUNTER — Ambulatory Visit: Payer: Federal, State, Local not specified - PPO

## 2021-01-23 DIAGNOSIS — R41840 Attention and concentration deficit: Secondary | ICD-10-CM | POA: Diagnosis present

## 2021-01-23 DIAGNOSIS — R41844 Frontal lobe and executive function deficit: Secondary | ICD-10-CM | POA: Insufficient documentation

## 2021-01-23 DIAGNOSIS — R278 Other lack of coordination: Secondary | ICD-10-CM | POA: Diagnosis present

## 2021-01-23 DIAGNOSIS — M6281 Muscle weakness (generalized): Secondary | ICD-10-CM | POA: Diagnosis present

## 2021-01-23 DIAGNOSIS — R41841 Cognitive communication deficit: Secondary | ICD-10-CM | POA: Diagnosis present

## 2021-01-23 DIAGNOSIS — I69853 Hemiplegia and hemiparesis following other cerebrovascular disease affecting right non-dominant side: Secondary | ICD-10-CM | POA: Diagnosis present

## 2021-01-23 DIAGNOSIS — I639 Cerebral infarction, unspecified: Secondary | ICD-10-CM | POA: Diagnosis present

## 2021-01-23 DIAGNOSIS — R4701 Aphasia: Secondary | ICD-10-CM | POA: Insufficient documentation

## 2021-01-23 DIAGNOSIS — R29818 Other symptoms and signs involving the nervous system: Secondary | ICD-10-CM | POA: Insufficient documentation

## 2021-01-23 DIAGNOSIS — R2681 Unsteadiness on feet: Secondary | ICD-10-CM | POA: Diagnosis present

## 2021-01-23 DIAGNOSIS — I69353 Hemiplegia and hemiparesis following cerebral infarction affecting right non-dominant side: Secondary | ICD-10-CM

## 2021-01-23 DIAGNOSIS — R2689 Other abnormalities of gait and mobility: Secondary | ICD-10-CM | POA: Insufficient documentation

## 2021-01-23 DIAGNOSIS — R41842 Visuospatial deficit: Secondary | ICD-10-CM | POA: Insufficient documentation

## 2021-01-23 NOTE — Therapy (Signed)
[facility] [address] [place], [place], [address] Phone: [phone]   Fax:  [phone]  Speech Language Pathology Evaluation  Patient Details  Name: Crystal Trevino MRN: [id number] Date of Birth: 05-Sep-1941 Referring Provider (SLP): Ferne Coe PA ((PCP: Addison Lank, Wendy-documentation))   Encounter Date: 01/23/2021   End of Session - 01/23/21 1528    Visit Number 1    Number of Visits 17    Date for SLP Re-Evaluation 04/23/21    SLP Start Time 1400    SLP Stop Time  1445    SLP Time Calculation (min) 45 min    Activity Tolerance Patient tolerated treatment well           Past Medical History:  Diagnosis Date  . Arthritis   . Breast CA ([place])    right breast cancer  . Cancer ([place])    HAD RIGHT BREAST BX --GOING TO CANCER CENTER ON WED 12/30/2017  . Constipation 08/13/2011  . Dizziness 08/13/2011  . Family history of adverse reaction to anesthesia    pt mother had PONV  . Family history of breast cancer   . Genetic testing 01/28/2018   Multi-Cancer panel (83 genes) @ [facility] - No pathogenic mutations detected  . GERD (gastroesophageal reflux disease)   . Heart murmur    "SLIGHT HEART MURMUR"  WAS PICKED UP AS AN ADULT  . Hyperlipidemia   . Hypertension   . Hypothyroidism   . Osteoarthritis of knee    Right  . Peripheral vascular disease ([place])   . Reflux   . Splenic artery aneurysm ([facility])    stable 1.5cm by 08/2013 CT; no further surveillance recommended  . Weight loss     Past Surgical History:  Procedure Laterality Date  . ABDOMINAL HYSTERECTOMY    . BACK SURGERY    . BREAST LUMPECTOMY WITH RADIOACTIVE SEED LOCALIZATION Right 02/19/2018   Procedure: BREAST LUMPECTOMY WITH RADIOACTIVE SEED LOCALIZATION. RIGHT;  Surgeon: Alphonsa Overall, MD;  Location: [facility];  Service: General;  Laterality: Right;  . BREAST SURGERY    . CHOLECYSTECTOMY    . KNEE SURGERY    . LOOP RECORDER INSERTION N/A 01/10/2021    Procedure: LOOP RECORDER INSERTION;  Surgeon: Constance Haw, MD;  Location: [facility];  Service: Cardiovascular;  Laterality: N/A;  . LUMBAR LAMINECTOMY    . TOTAL KNEE ARTHROPLASTY Right 01/04/2018  . TOTAL KNEE ARTHROPLASTY Right 01/04/2018   Procedure: TOTAL KNEE ARTHROPLASTY;  Surgeon: Vickey Huger, MD;  Location: [facility];  Service: Orthopedics;  Laterality: Right;  Marland Kitchen VAGINAL PROLAPSE REPAIR     2015   DONE IN [place]  . VAGINAL PROLAPSE REPAIR  05/20/2018    There were no vitals filed for this visit.   Subjective Assessment - 01/23/21 1402    Subjective "I'm think I'm doing okay"    Patient is accompained by: Family member   Jeff-son             SLP Evaluation [facility] - 01/23/21 1407      SLP Visit Information   SLP Received On 01/16/21    Referring Provider (SLP) Ferne Coe PA   (PCP: Addison Lank, Wendy-documentation)   Onset Date 01/07/2021    Medical Diagnosis CVA      Subjective   Subjective "I don't know. I think I'm doing okay" with decreased awareness and insight into deficits demonstrated    Patient/Family Stated Goal to increase independence      Pain Assessment  Currently in Pain? No/denies    Pain Score 0-No pain      General Information   HPI 80 year old female PMH with hypertension, HLD, hypothyroidism, right breast cancer status post lumpectomy and adjuvant radiation on tamoxifen presented 01/06/2021 to [facility] health ED via EMS for evaluation of dysarthria and right facial weakness. Acute CVA-brain MRI with findings consistent with left MCA occlusion      Balance Screen   Has the patient fallen in the past 6 months No    Has the patient had a decrease in activity level because of a fear of falling?  No    Is the patient reluctant to leave their home because of a fear of falling?  No      Prior Functional Status   Cognitive/Linguistic Baseline Within functional limits    Type of Home House     Lives With Friend(s)    Available Support Family     Education masters in adult education    Vocation Retired      Associate Professor   Overall Cognitive Status Impaired/Different from baseline    Area of Impairment Memory;Following commands;Safety/judgement;Awareness;Attention    Attention Comments infrequent error awareness. mild selective attention deficits.    Memory Comments decreased recall of instructions    Following Command Comments difficulty with multi-step instructions    Awareness Intellectual;Emergent    Awareness Comments decreased awareness of errors      Auditory Comprehension   Overall Auditory Comprehension Appears within functional limits for tasks assessed      Verbal Expression   Overall Verbal Expression Impaired    Initiation No impairment    Level of Generative/Spontaneous Verbalization Conversation    Naming Impairment    Responsive 76-100% accurate    Verbal Errors Not aware of errors;Inconsistent      Written Expression   Dominant Hand Left      Oral Motor/Sensory Function   Overall Oral Motor/Sensory Function Appears within functional limits for tasks assessed      Motor Speech   Overall Motor Speech Appears within functional limits for tasks assessed      Standardized Assessments   Standardized Assessments  Cognitive Linguistic Quick Test      Cognitive Linguistic Quick Test (Ages 18-69)   Attention Mild    Memory Mild    Executive Function Moderate    Language Mild    Visuospatial Skills Mild    Severity Rating Total 14    Composite Severity Rating 11.6                           SLP Education - 01/23/21 1527    Education Details eval results, possible goals, supervision for med management    Person(s) Educated Patient;Child(ren)    Methods Explanation;Verbal cues    Comprehension Verbalized understanding;Returned demonstration            SLP Short Term Goals - 01/23/21 1657      SLP SHORT TERM GOAL #1   Title Pt will verbalize daily schedule, recent events and salient  information using external memory aids with min A over 3 sessions    Time 4    Period Weeks    Status New      SLP SHORT TERM GOAL #2   Title Pt will verbalize reasonable/safe solutions to functional safety scenarios at home (transfers, cooking, meds etc) and carry these over with min A from caregivers    Time 4    Period  Weeks    Status New      SLP SHORT TERM GOAL #3   Title Pt will comprehend multi step directions to complete iADL tasks of personal relevance with min A cues over 3 sessions    Time 4    Period Weeks    Status New      SLP SHORT TERM GOAL #4   Title Pt will verbalize and demonstrate compensations for error awareness with min A over 3 sessions    Time 4    Period Weeks    Status New      SLP SHORT TERM GOAL #5   Title Pt will use word finding compensations in 10 minute mod complex conversation with min A over 3 sessions            SLP Long Term Goals - 01/23/21 1649      SLP LONG TERM GOAL #1   Title Pt will verbalize accurate usage and implemention of external memory aids at home with rare min A from caregiver over 2 sessions    Time 8    Period Weeks    Status New      SLP LONG TERM GOAL #2   Title Pt will comprehend multi step directions to complete iADL tasks of personal relevance with rare min A cues fronm caregiver between 3 sessions    Time 8    Period Weeks    Status New      SLP LONG TERM GOAL #3   Title Pt will verbalize and demonstrate compensations for error awareness within home environment with rare min A from caregivers between 3 sessions    Time 8    Period Weeks    Status New      SLP LONG TERM GOAL #4   Title Pt will use word finding compensations in 15 minute mod complex conversation with rare min A over 3 sessions    Time 8    Period Weeks    Status New            Plan - 01/23/21 1701    Clinical Impression Statement Yaneth "Crystal Trevino" presents with mild cognitive linguistic deficits and 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 Crystal Trevino was independent with all ADLs and iADLs prior to CVA. Pt lives with friend who can provide intermittent assistance as well as local family who are actively involved. CLQT completed this session, with mild deficits demonstrated for attention, memory, lanaguge, and visuospatial skills. Moderate deficits noted for executive functioning. Pt currently presents with decreased awareness of deficits and errors, as pt unable to identify or correct errors when prompted. Crystal Trevino and her family would like to increase her cognitive linguistic skills to optimize functional independence, safety, and communication for possible return to PLOF.           Patient will benefit from skilled therapeutic intervention in order to improve the following deficits and impairments:   Aphasia  Acute CVA (cerebrovascular accident) [facility])  Cognitive communication deficit    Problem List Patient Active Problem List   Diagnosis Date Noted  . Normocytic anemia 01/08/2021  . Acute CVA (cerebrovascular accident) ([place]) 01/07/2021  . Pre-diabetes 01/07/2021  . Essential hypertension 01/06/2021  . Hyperlipidemia 01/06/2021  . Hypothyroidism 01/06/2021  . Genetic testing 01/28/2018  . S/P total knee replacement 01/04/2018  . Family history of breast cancer   . 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 Aneurysm of splenic artery (  [place]) 08/23/2012    Alinda Deem, MA CCC-SLP 01/23/2021, 5:11 PM  [facility] [address] [place], [place], [address] Phone: [phone]   Fax:  [phone]  Name: DAQUISHA CLERMONT MRN: [id number] Date of Birth: 06/07/1941

## 2021-01-24 ENCOUNTER — Encounter: Payer: Self-pay | Admitting: Occupational Therapy

## 2021-01-24 ENCOUNTER — Ambulatory Visit: Payer: Federal, State, Local not specified - PPO

## 2021-01-24 ENCOUNTER — Encounter: Payer: Federal, State, Local not specified - PPO | Admitting: Occupational Therapy

## 2021-01-24 ENCOUNTER — Ambulatory Visit: Payer: Federal, State, Local not specified - PPO | Admitting: Occupational Therapy

## 2021-01-24 DIAGNOSIS — R41842 Visuospatial deficit: Secondary | ICD-10-CM

## 2021-01-24 DIAGNOSIS — R29818 Other symptoms and signs involving the nervous system: Secondary | ICD-10-CM

## 2021-01-24 DIAGNOSIS — R41844 Frontal lobe and executive function deficit: Secondary | ICD-10-CM

## 2021-01-24 DIAGNOSIS — M6281 Muscle weakness (generalized): Secondary | ICD-10-CM

## 2021-01-24 DIAGNOSIS — R41840 Attention and concentration deficit: Secondary | ICD-10-CM

## 2021-01-24 DIAGNOSIS — R278 Other lack of coordination: Secondary | ICD-10-CM

## 2021-01-24 DIAGNOSIS — R2681 Unsteadiness on feet: Secondary | ICD-10-CM

## 2021-01-24 DIAGNOSIS — I69853 Hemiplegia and hemiparesis following other cerebrovascular disease affecting right non-dominant side: Secondary | ICD-10-CM

## 2021-01-24 DIAGNOSIS — R2689 Other abnormalities of gait and mobility: Secondary | ICD-10-CM

## 2021-01-24 NOTE — Therapy (Signed)
[facility] [address] [place], [place], [address] Phone: [phone]   Fax:  [phone]  Physical Therapy Evaluation  Patient Details  Name: Crystal Trevino MRN: [id number] Date of Birth: Sep 03, 1941 Referring Provider (PT): Ferne Coe PA   Encounter Date: 01/23/2021   PT End of Session - 01/24/21 1157    Visit Number 1    Number of Visits 12    Date for PT Re-Evaluation 03/28/21    Authorization Type BCBS federal    PT Start Time 3614    PT Stop Time 1530    PT Time Calculation (min) 45 min    Equipment Utilized During Treatment Gait belt    Activity Tolerance Patient tolerated treatment well    Behavior During Therapy Flat affect           Past Medical History:  Diagnosis Date  . Arthritis   . Breast CA ([place])    right breast cancer  . Cancer ([place])    HAD RIGHT BREAST BX --GOING TO [facility]ER C[facility] ON WED 12/30/2017  . Constipation 08/13/2011  . Dizziness 08/13/2011  . Family history of adverse reaction to anesthesia    pt mother had PONV  . Family history of breast cancer   . Genetic testing 01/28/2018   Multi-Cancer panel (83 genes) @ [facility] - No pathogenic mutations detected  . GERD (gastroesophageal reflux disease)   . Heart murmur    "SLIGHT HEART MURMUR"  WAS PICKED UP AS AN ADULT  . Hyperlipidemia   . Hypertension   . Hypothyroidism   . Osteoarthritis of knee    Right  . Peripheral vascular disease ([place])   . Reflux   . Splenic artery aneurysm (HCC)    stable 1.5cm by 08/2013 CT; no further surveillance recommended  . Weight loss     Past Surgical History:  Procedure Laterality Date  . ABDOMINAL HYSTERECTOMY    . BACK SURGERY    . BREAST LUMPECTOMY WITH RADIOACTIVE SEED LOCALIZATION Right 02/19/2018   Procedure: BREAST LUMPECTOMY WITH RADIOACTIVE SEED LOCALIZATION. RIGHT;  Surgeon: Alphonsa Overall, MD;  Location: [facility];  Service: General;  Laterality: Right;  . BREAST SURGERY    .  CHOLECYSTECTOMY    . KNEE SURGERY    . LOOP RECORDER INSERTION N/A 01/10/2021   Procedure: LOOP RECORDER INSERTION;  Surgeon: Constance Haw, MD;  Location: [facility];  Service: Cardiovascular;  Laterality: N/A;  . LUMBAR LAMINECTOMY    . TOTAL KNEE ARTHROPLASTY Right 01/04/2018  . TOTAL KNEE ARTHROPLASTY Right 01/04/2018   Procedure: TOTAL KNEE ARTHROPLASTY;  Surgeon: Vickey Huger, MD;  Location: [facility];  Service: Orthopedics;  Laterality: Right;  Marland Kitchen VAGINAL PROLAPSE REPAIR     2015   DONE IN [place]  . VAGINAL PROLAPSE REPAIR  05/20/2018    There were no vitals filed for this visit.    Subjective Assessment - 01/23/21 1451    Subjective reports she was DCed from inpatient rehab yesterday at [facility], able to ambulate with [facility] as well as negotiate steps, denies pain    Patient is accompained by: Family member   son [name]   How long can you sit comfortably? n/a    How long can you stand comfortably? n/a    How long can you walk comfortably? unlimited per patient    Currently in Pain? No/denies             01/23/21 0001  Assessment  Medical Diagnosis CVA  Referring Provider (PT) Ferne Coe PA  Onset Date/Surgical Date 01/06/21  Next MD Visit 02/14/21  Prior Therapy inpatient  Precautions  Precautions Fall  Balance Screen  Has the patient fallen in the past 6 months No  Has the patient had a decrease in activity level because of a fear of falling?  No  Home Environment  Living Environment Private residence  Living Arrangements Non-relatives/Friends  Type of [place] to enter  Entrance Stairs-Number of Steps 5  Entrance Stairs-Rails None  Alternate Level Stairs-Rails Left  Home Equipment Tub bench;Hand held shower head  Strength  Overall Strength Deficits  Overall Strength Comments RLE 4/5 strength grossly, LLE strength 4+/5 grossly  Transfers  Transfers Sit to Stand;Stand to Sit  Sit to Stand 5: Supervision;4: Min guard  Sit to Stand  Details Tactile cues for weight beaing  Five time sit to stand comments  20.56  Ambulation/Gait  Ambulation/Gait Yes  Ambulation/Gait Assistance 5: Supervision  Ambulation Distance (Feet) 100 Feet  Assistive device Straight cane  Gait Pattern Step-through pattern  Ambulation Surface Level;Indoor  Timed Up and Go Test  TUG Normal TUG  Normal TUG (seconds) 16.2  TUG Comments cane  High Level Balance  High Level Balance Comments Able to hol all 4 posiitons on MCTSIB for 30s                 Objective measurements completed on examination: See above findings.               PT Education - 01/24/21 1156    Education Details fall prevention strategies    Person(s) Educated Patient;Child(ren)    Methods Explanation;Demonstration    Comprehension Verbalized understanding;Returned demonstration            PT Short Term Goals - 01/24/21 1206      PT SHORT TERM GOAL #1   Title Demo HEP back to PT w/o need of VCs    Baseline S required for HPE    Time 4    Period Weeks    Status New    Target Date 02/21/21      PT SHORT TERM GOAL #2   Title ambulation of 361ft with LRAD and S across level ground    Baseline in clinic ambulation with close S and SPC    Time 4    Period Weeks    Status New    Target Date 02/21/21      PT SHORT TERM GOAL #3   Title assess FGA and establish    Baseline n/a    Time 4    Period Weeks    Status New    Target Date 02/21/21             PT Long Term Goals - 01/24/21 1218      PT LONG TERM GOAL #1   Title perform 5x STS in 15s or less w/o need of UE support    Baseline 20.5s with UE support    Time 8    Period Weeks    Status New    Target Date 03/21/21      PT LONG TERM GOAL #2   Title Ambulate 1036ft across unlevel and level ground under distant S with LRAD    Baseline 176ft across level ground with close S and SPC    Time 8    Period Weeks    Status New    Target Date 03/21/21  PT LONG TERM GOAL #3    Title Perform standard TUG in 13 seconds or less with LRAD    Baseline 16.2 s with SPC    Time 8    Period Weeks    Status New    Target Date 03/21/21                  Plan - 01/24/21 1158    Clinical Impression Statement Patient presents with mild R sided strength and balance deficits following L CVA resulting in short hosital stay and subsequentIP rehab, [facility] and will be under S of family members, she demos a flat affect and is difficult to obtain subjective info, son present to assist with PMHx and fuctional abilities, she is ambulating with a SPC at this time and tends to lean R but does not lose balance    Personal Factors and Comorbidities Age;Comorbidity 2    Comorbidities CVA    Stability/Clinical Decision Making Stable/Uncomplicated    Clinical Decision Making Low    Rehab Potential Good    PT Frequency 2x / week    PT Duration 8 weeks    PT Treatment/Interventions Aquatic Therapy;DME Instruction;Gait training;Stair training;Functional mobility training;Therapeutic activities;Therapeutic exercise;Balance training;Neuromuscular re-education;Patient/family education;Orthotic Fit/Training    PT Next Visit Plan establish strength and balance program, assess FGA, gait training with cane to assess and improve safety awareness    PT Home Exercise Plan fall prevention and 5x STS transfers under S    Consulted and Agree with Plan of Care Patient;Family member/caregiver    Family Member Consulted son [name]           Patient will benefit from skilled therapeutic intervention in order to improve the following deficits and impairments:  Abnormal gait,Decreased balance,Decreased endurance,Difficulty walking,Decreased activity tolerance,Decreased coordination,Decreased safety awareness,Decreased strength  Visit Diagnosis: Unsteadiness on feet  Hemiplegia and hemiparesis following cerebral infarction affecting right non-dominant side  [facility])     Problem List Patient Active Problem List   Diagnosis Date Noted  . Normocytic anemia 01/08/2021  . Acute CVA (cerebrovascular accident) ([place]) 01/07/2021  . Pre-diabetes 01/07/2021  . Essential hypertension 01/06/2021  . Hyperlipidemia 01/06/2021  . Hypothyroidism 01/06/2021  . Genetic testing 01/28/2018  . S/P total knee replacement 01/04/2018  . Family history of breast cancer   . 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 Aneurysm of splenic artery ([place]) 08/23/2012    Lanice Shirts PT 01/24/2021, 3:18 PM  [facility] [address] [place], [place], [address] Phone: [phone]   Fax:  [phone]  Name: IMANNI BURDINE MRN: [id number] Date of Birth: May 10, 1941

## 2021-01-24 NOTE — Therapy (Signed)
[facility] [address] [place], [place], [address] Phone: [phone]   Fax:  [phone]  Occupational Therapy Evaluation  Patient Details  Name: Crystal Trevino MRN: [id number] Date of Birth: [birth year] Referring Provider (OT): Leonides Schanz [place] ([place])   Encounter Date: 01/24/2021   OT End of Session - 01/24/21 1225    Visit Number 1    Number of Visits 13    Date for OT Re-Evaluation 03/21/21    Authorization Type BCBS Federal    Authorization Time Period 75 PT/OT/ST $25 copay    Authorization - Number of Visits 25    OT Start Time 1107    OT Stop Time 1145    OT Time Calculation (min) 38 min    Activity Tolerance Patient tolerated treatment well    Behavior During Therapy WFL for tasks assessed/performed;Flat affect           Past Medical History:  Diagnosis Date  . Arthritis   . Breast CA ([place])    right breast cancer  . Cancer ([place])    HAD RIGHT BREAST BX --GOING TO [facility] 12/30/2017  . Constipation 08/13/2011  . Dizziness 08/13/2011  . Family history of adverse reaction to anesthesia    pt mother had PONV  . Family history of breast cancer   . Genetic testing 01/28/2018   Multi-Cancer panel (83 genes) @ [facility] - No pathogenic mutations detected  . GERD (gastroesophageal reflux disease)   . Heart murmur    "SLIGHT HEART MURMUR"  WAS PICKED UP AS AN ADULT  . Hyperlipidemia   . Hypertension   . Hypothyroidism   . Osteoarthritis of knee    Right  . Peripheral vascular disease ([place])   . Reflux   . Splenic artery aneurysm (HCC)    stable 1.5cm by 08/2013 CT; no further surveillance recommended  . Weight loss     Past Surgical History:  Procedure Laterality Date  . ABDOMINAL HYSTERECTOMY    . BACK SURGERY    . BREAST LUMPECTOMY WITH RADIOACTIVE SEED LOCALIZATION Right 02/19/2018   Procedure: BREAST LUMPECTOMY WITH RADIOACTIVE SEED LOCALIZATION. RIGHT;  Surgeon: Alphonsa Overall, MD;   Location: [facility];  Service: General;  Laterality: Right;  . BREAST SURGERY    . CHOLECYSTECTOMY    . KNEE SURGERY    . LOOP RECORDER INSERTION N/A 01/10/2021   Procedure: LOOP RECORDER INSERTION;  Surgeon: Constance Haw, MD;  Location: [facility];  Service: Cardiovascular;  Laterality: N/A;  . LUMBAR LAMINECTOMY    . TOTAL KNEE ARTHROPLASTY Right 01/04/2018  . TOTAL KNEE ARTHROPLASTY Right 01/04/2018   Procedure: TOTAL KNEE ARTHROPLASTY;  Surgeon: Vickey Huger, MD;  Location: [facility];  Service: Orthopedics;  Laterality: Right;  Marland Kitchen VAGINAL PROLAPSE REPAIR     2015   DONE IN [place]  . VAGINAL PROLAPSE REPAIR  05/20/2018    There were no vitals filed for this visit.   Subjective Assessment - 01/24/21 1215    Subjective  Pt reports that nothing has changed since stroke. Pt denies any pain.    Patient is accompanied by: Family member   son, Crystal Trevino   Pertinent History PMHx: R breast cancer s/p lumpectomy and HTN.    Limitations Fall Risk. Loop recorder    Patient Stated Goals Pt reported no deficits.    Currently in Pain? No/denies             [facility] OT Assessment - 01/24/21 1108  Assessment   Medical Diagnosis CVA    Referring Provider (OT) Donivan Scull (PA)    Hand Dominance Left      Precautions   Precautions Fall;Other (comment)    Precaution Comments loop recorder      Balance Screen   Has the patient fallen in the past 6 months No      Home  Environment   Family/patient expects to be discharged to: Private residence    Living Arrangements Non-relatives/Friends    Available Help at Discharge Friend(s)    Type of [place]   5 - but does not use that entrance. uses entrance with 2 steps   Home Layout Two level   bed and bath upstairs (13)   Bathroom Shower/Tub Tub/Shower unit    Shower/tub characteristics [place] -quad;Tub bench      Prior Function   Level of Independence Independent    Vocation Retired     Leisure [employer]      ADL   Eating/Feeding Independent    Grooming Independent    Presenter, broadcasting - Social research officer, government -  Product/process development scientist Modified independent      IADL   Prior Level of Research scientist (life sciences) for transportation    Prior Level of Function Light Housekeeping independent    Light Housekeeping --   has help at home but pt reports she would be able to complete   Prior Level of Function Meal Prep Independent    Meal Prep --   not currently doing any cooking   Prior Level of Function Community Mobility drove own vehicle prior    [employer] Relies on family or friends for transportation    Prior Level of Function Medication Managment Independent    Medication Management Is responsible for taking medication in correct dosages at correct time    Prior Level of Function Therapist, sports financial matters independently (budgets, writes checks, pays rent, bills goes to bank), collects and keeps track of income      Mobility   Mobility Status Comments walks with quad cane      Written Expression   Dominant Hand Left      Vision - History   Baseline Vision Wears glasses all the time    Additional Comments pt reports no changes      Activity Tolerance   Activity Tolerance Comments pt reports she does not think there would be any difficulty with activity tolerance      Cognition   Awareness Comments decreased self awareness into deficits      Observation/Other Assessments   Focus on Therapeutic Outcomes (FOTO)  68%      Sensation   Light Touch Appears Intact    Hot/Cold Appears Intact      Coordination   9 Hole Peg Test Right;Left    Right 9  Hole Peg Test 32.03s    Left 9 Hole Peg Test 27.12s      Perception   Perception Impaired    Inattention/Neglect Does not attend to right side of body   and right visual field. Tabletop scanning 93% and  Environmental Scanning 80% - all errors on right     ROM / Strength   AROM / PROM / Strength AROM;Strength      AROM   Overall AROM  Within functional limits for tasks performed      Strength   Overall Strength Within functional limits for tasks performed      Hand Function   Right Hand Gross Grasp Functional    Right Hand Grip (lbs) 27.9 lb    Left Hand Gross Grasp Functional    Left Hand Grip (lbs) 45.4 lb                           OT Education - 01/24/21 1227    Education Details Education provided on role and purpose of OT    Person(s) Educated Patient;Child(ren)    Methods Explanation    Comprehension Verbalized understanding            OT Short Term Goals - 01/24/21 1231      OT SHORT TERM GOAL #1   Title Pt will be independent with HEP for grip strengthening RUE 02/21/21    Time 4    Period Weeks    Status New    Target Date 02/21/21      OT SHORT TERM GOAL #2   Title Pt will perform environmental scanning with 90% accuracy or greater.    Baseline 80% with all 3 errors on right    Time 4    Period Weeks    Status New      OT SHORT TERM GOAL #3   Title Pt will perform warm meal prep and/or home management tasks with mod I    Time 4    Period Weeks    Status New      OT SHORT TERM GOAL #4   Title Pt will improve grip strength in RUE by 5 lbs in order to increase functional use of RUE    Baseline RUE 27.9 lbs, LUE 45.4 lbs    Time 4    Period Weeks    Status New             OT Long Term Goals - 01/24/21 1244      OT LONG TERM GOAL #1   Title Pt will be independent with any updated HEP 03/21/21    Time 8    Period Weeks    Status New    Target Date 03/21/21      OT LONG TERM GOAL #2   Title Pt will perform physical and  cognitive task simultaneously with 95% accuracy    Time 8    Period Weeks    Status New      OT LONG TERM GOAL #3   Title Pt will return to prior level of functioning for cooking and home management.    Time 8    Period Weeks    Status New      OT LONG TERM GOAL #4   Title Pt will improve grip strength to 35 lbs or greater in RUE    Baseline RUE 27.9 lbs    Time 8    Period Weeks    Status New                 Plan - 01/24/21 1222    Clinical Impression Statement Pt is a 80 yo female that presents to neuro OPOT s/p CVA. Pt was admitted secondary to  slurred speech. 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 PMHx: R breast cancer s/p lumpectomy and HTN. Pt presents with deficits with right inattention, decreased awareness, unsteadiness on feet, and right decreased grip strength impacting overall independence with ADLs and IADLs. Skilled occupational therapy is recommended to target listed areas of deficit and increase independence and decrease caregiver burden.    OT Occupational Profile and History Problem Focused Assessment - Including review of records relating to presenting problem    Occupational performance deficits (Please refer to evaluation for details): IADL's;Leisure;ADL's    Body Structure / Function / Physical Skills ADL;Coordination;IADL;GMC;Dexterity;Decreased knowledge of use of DME;FMC;UE functional use;Vision    Cognitive Skills Attention;Safety Awareness;Perception;Understand    Rehab Potential Good    Clinical Decision Making Limited treatment options, no task modification necessary    Comorbidities Affecting Occupational Performance: None    Modification or Assistance to Complete Evaluation  No modification of tasks or assist necessary to complete eval    OT Frequency 2x / week    OT Duration 8 weeks   2x/week for 4 weeks and 1x/week for 4 weeks or 12 visits over 8 weeks.   OT  Treatment/Interventions Self-care/ADL training;DME and/or AE instruction;Passive range of motion;Therapeutic activities;Functional Mobility Training;Neuromuscular education;Patient/family education;Visual/perceptual remediation/compensation;Therapeutic exercise;Cognitive remediation/compensation    Plan grip strengthening HEP, environmental scanning    Consulted and Agree with Plan of Care Patient;Family member/caregiver    Family Member Consulted son, Crystal Trevino           Patient will benefit from skilled therapeutic intervention in order to improve the following deficits and impairments:   Body Structure / Function / Physical Skills: ADL,Coordination,IADL,GMC,Dexterity,Decreased knowledge of use of DME,FMC,UE functional use,Vision Cognitive Skills: Attention,Safety Awareness,Perception,Understand     Visit Diagnosis: Hemiplegia and hemiparesis following other cerebrovascular disease affecting right non-dominant side (HCC)  Other lack of coordination  Muscle weakness (generalized)  Visuospatial deficit  Other symptoms and signs involving the nervous system  Unsteadiness on feet  Other abnormalities of gait and mobility  Attention and concentration deficit  Frontal lobe and executive function deficit    Problem List Patient Active Problem List   Diagnosis Date Noted  . Normocytic anemia 01/08/2021  . Acute CVA (cerebrovascular accident) ([place]) 01/07/2021  . Pre-diabetes 01/07/2021  . Essential hypertension 01/06/2021  . Hyperlipidemia 01/06/2021  . Hypothyroidism 01/06/2021  . Genetic testing 01/28/2018  . S/P total knee replacement 01/04/2018  . Family history of breast cancer   . 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 Aneurysm of splenic artery ([place]) 08/23/2012    Zachery Conch MOT, OTR/L  01/24/2021, 12:46 PM  [facility] [address] [place], [place], [address] Phone:  [phone]   Fax:  [phone]  Name: Crystal Trevino MRN: [id number] Date of Birth: 06-24-1941

## 2021-01-24 NOTE — Patient Instructions (Addendum)
fall prevention and 5x STS transfers under S

## 2021-01-29 ENCOUNTER — Ambulatory Visit: Payer: Federal, State, Local not specified - PPO | Attending: Internal Medicine

## 2021-01-29 ENCOUNTER — Other Ambulatory Visit: Payer: Self-pay

## 2021-01-29 ENCOUNTER — Ambulatory Visit: Payer: Federal, State, Local not specified - PPO | Admitting: Occupational Therapy

## 2021-01-29 ENCOUNTER — Encounter: Payer: Self-pay | Admitting: Occupational Therapy

## 2021-01-29 ENCOUNTER — Ambulatory Visit: Payer: Federal, State, Local not specified - PPO

## 2021-01-29 ENCOUNTER — Ambulatory Visit (INDEPENDENT_AMBULATORY_CARE_PROVIDER_SITE_OTHER): Payer: Federal, State, Local not specified - PPO | Admitting: Emergency Medicine

## 2021-01-29 DIAGNOSIS — I639 Cerebral infarction, unspecified: Secondary | ICD-10-CM | POA: Diagnosis present

## 2021-01-29 DIAGNOSIS — I69853 Hemiplegia and hemiparesis following other cerebrovascular disease affecting right non-dominant side: Secondary | ICD-10-CM | POA: Diagnosis present

## 2021-01-29 DIAGNOSIS — R41844 Frontal lobe and executive function deficit: Secondary | ICD-10-CM | POA: Diagnosis present

## 2021-01-29 DIAGNOSIS — R278 Other lack of coordination: Secondary | ICD-10-CM | POA: Diagnosis present

## 2021-01-29 DIAGNOSIS — R41841 Cognitive communication deficit: Secondary | ICD-10-CM | POA: Insufficient documentation

## 2021-01-29 DIAGNOSIS — R29818 Other symptoms and signs involving the nervous system: Secondary | ICD-10-CM

## 2021-01-29 DIAGNOSIS — R4701 Aphasia: Secondary | ICD-10-CM | POA: Insufficient documentation

## 2021-01-29 DIAGNOSIS — R41840 Attention and concentration deficit: Secondary | ICD-10-CM | POA: Diagnosis present

## 2021-01-29 DIAGNOSIS — R41842 Visuospatial deficit: Secondary | ICD-10-CM

## 2021-01-29 DIAGNOSIS — R2681 Unsteadiness on feet: Secondary | ICD-10-CM | POA: Insufficient documentation

## 2021-01-29 DIAGNOSIS — M6281 Muscle weakness (generalized): Secondary | ICD-10-CM | POA: Insufficient documentation

## 2021-01-29 LAB — CUP PACEART INCLINIC DEVICE CHECK
Date Time Interrogation Session: 20220301094951
Implantable Pulse Generator Implant Date: 20220210

## 2021-01-29 NOTE — Patient Instructions (Signed)
Access Code: [email or web address] URL: [email or web address] Date: 01/29/2021 Prepared by: Sharlynn Oliphant  Exercises Standing Balance in Corner with Eyes Closed - 1 x daily - 7 x weekly - 3 sets - 10 reps

## 2021-01-29 NOTE — Therapy (Signed)
[facility] [address] [place], [place], [address] Phone: [phone]   Fax:  [phone]  Occupational Therapy Treatment  Patient Details  Name: Crystal Trevino MRN: [id number] Date of Birth: Aug 23, 1941 Referring Provider (OT): Theodosia Quay [place] ([place])   Encounter Date: 01/29/2021   OT End of Session - 01/29/21 1234    Visit Number 2    Number of Visits 13    Date for OT Re-Evaluation 03/21/21    Authorization Type BCBS Federal    Authorization Time Period 75 PT/OT/ST $25 copay    Authorization - Visit Number 1    Authorization - Number of Visits 25    OT Start Time 1233    OT Stop Time 1312    OT Time Calculation (min) 39 min    Activity Tolerance Patient tolerated treatment well    Behavior During Therapy [facility] for tasks assessed/performed;Flat affect           Past Medical History:  Diagnosis Date  . Arthritis   . Breast CA (HCC)    right breast cancer  . Cancer (HCC)    HAD RIGHT BREAST BX --GOING [facility] 12/30/2017  . Constipation 08/13/2011  . Dizziness 08/13/2011  . Family history of adverse reaction to anesthesia    pt mother had PONV  . Family history of breast cancer   . Genetic testing 01/28/2018   Multi-Cancer panel (83 genes) @ [facility] - No pathogenic mutations detected  . GERD (gastroesophageal reflux disease)   . Heart murmur    "SLIGHT HEART MURMUR"  WAS PICKED UP AS AN ADULT  . Hyperlipidemia   . Hypertension   . Hypothyroidism   . Osteoarthritis of knee    Right  . Peripheral vascular disease (HCC)   . Reflux   . Splenic artery aneurysm (HCC)    stable 1.5cm by 08/2013 CT; no further surveillance recommended  . Weight loss     Past Surgical History:  Procedure Laterality Date  . ABDOMINAL HYSTERECTOMY    . BACK SURGERY    . BREAST LUMPECTOMY WITH RADIOACTIVE SEED LOCALIZATION Right 02/19/2018   Procedure: BREAST LUMPECTOMY WITH RADIOACTIVE SEED LOCALIZATION.  RIGHT;  Surgeon: Ovidio Kin, MD;  Location: [facility] OR;  Service: General;  Laterality: Right;  . BREAST SURGERY    . CHOLECYSTECTOMY    . KNEE SURGERY    . LOOP RECORDER INSERTION N/A 01/10/2021   Procedure: LOOP RECORDER INSERTION;  Surgeon: Regan Lemming, MD;  Location: [facility] INVASIVE CV LAB;  Service: Cardiovascular;  Laterality: N/A;  . LUMBAR LAMINECTOMY    . TOTAL KNEE ARTHROPLASTY Right 01/04/2018  . TOTAL KNEE ARTHROPLASTY Right 01/04/2018   Procedure: TOTAL KNEE ARTHROPLASTY;  Surgeon: Dannielle Huh, MD;  Location: [facility] OR;  Service: Orthopedics;  Laterality: Right;  Marland Kitchen VAGINAL PROLAPSE REPAIR     2015   DONE IN [place]  . VAGINAL PROLAPSE REPAIR  05/20/2018    There were no vitals filed for this visit.   Subjective Assessment - 01/29/21 1236    Subjective  Pt denies any pain.    Patient is accompanied by: Family member   daughter   Pertinent History PMHx: R breast cancer s/p lumpectomy and HTN.    Limitations Fall Risk. Loop recorder    Patient Stated Goals Pt reported no deficits.    Currently in Pain? No/denies  OT Treatments/Exercises (OP) - 01/29/21 1250      Exercises   Exercises Hand      Hand Exercises   Other Hand Exercises Hand gripper with silver spring on level 1 with RUE    Other Hand Exercises Motor planning with hackey sack and small bounce ball with tossing up in the air and catching, tosing between hands and with RUE      Visual/Perceptual Exercises   Scanning Tabletop    Scanning - Tabletop Perfection board with increased time for scanning to the right and attending to the pieces. Table top scanning with sorting playing cards with attention to scanning to the right. Pt with minimal errors with double stacks and difficulty with following instructions of single row and processof activity.                  OT Education - 01/29/21 1242    Education Details Red theraputty - see pt instructions    Person(s)  Educated Patient;Child(ren)    Methods Explanation;Demonstration;Handout    Comprehension Verbalized understanding;Returned demonstration            OT Short Term Goals - 01/29/21 1254      OT SHORT TERM GOAL #1   Title Pt will be independent with HEP for grip strengthening RUE 02/21/21    Time 4    Period Weeks    Status On-going    Target Date 02/21/21      OT SHORT TERM GOAL #2   Title Pt will perform environmental scanning with 90% accuracy or greater.    Baseline 80% with all 3 errors on right    Time 4    Period Weeks    Status New      OT SHORT TERM GOAL #3   Title Pt will perform warm meal prep and/or home management tasks with mod I    Time 4    Period Weeks    Status New      OT SHORT TERM GOAL #4   Title Pt will improve grip strength in RUE by 5 lbs in order to increase functional use of RUE    Baseline RUE 27.9 lbs, LUE 45.4 lbs    Time 4    Period Weeks    Status On-going             OT Long Term Goals - 01/24/21 1244      OT LONG TERM GOAL #1   Title Pt will be independent with any updated HEP 03/21/21    Time 8    Period Weeks    Status New    Target Date 03/21/21      OT LONG TERM GOAL #2   Title Pt will perform physical and cognitive task simultaneously with 95% accuracy    Time 8    Period Weeks    Status New      OT LONG TERM GOAL #3   Title Pt will return to prior level of functioning for cooking and home management.    Time 8    Period Weeks    Status New      OT LONG TERM GOAL #4   Title Pt will improve grip strength to 35 lbs or greater in RUE    Baseline RUE 27.9 lbs    Time 8    Period Weeks    Status New                 Plan - 01/29/21  1244    Clinical Impression Statement Pt demonstrates difficulty with motor planning and imitating today.    OT Occupational Profile and History Problem Focused Assessment - Including review of records relating to presenting problem    Occupational performance deficits (Please  refer to evaluation for details): IADL's;Leisure;ADL's    Body Structure / Function / Physical Skills ADL;Coordination;IADL;GMC;Dexterity;Decreased knowledge of use of DME;FMC;UE functional use;Vision    Cognitive Skills Attention;Safety Awareness;Perception;Understand    Rehab Potential Good    Clinical Decision Making Limited treatment options, no task modification necessary    Comorbidities Affecting Occupational Performance: None    Modification or Assistance to Complete Evaluation  No modification of tasks or assist necessary to complete eval    OT Frequency 2x / week    OT Duration 8 weeks   2x/week for 4 weeks and 1x/week for 4 weeks or 12 visits over 8 weeks.   OT Treatment/Interventions Self-care/ADL training;DME and/or AE instruction;Passive range of motion;Therapeutic activities;Functional Mobility Training;Neuromuscular education;Patient/family education;Visual/perceptual remediation/compensation;Therapeutic exercise;Cognitive remediation/compensation    Plan environmental scanning, right attention tasks    Consulted and Agree with Plan of Care Patient;Family member/caregiver    Family Member Consulted son, Trey Paula           Patient will benefit from skilled therapeutic intervention in order to improve the following deficits and impairments:   Body Structure / Function / Physical Skills: ADL,Coordination,IADL,GMC,Dexterity,Decreased knowledge of use of DME,FMC,UE functional use,Vision Cognitive Skills: Attention,Safety Awareness,Perception,Understand     Visit Diagnosis: Hemiplegia and hemiparesis following other cerebrovascular disease affecting right non-dominant side (HCC)  Other lack of coordination  Muscle weakness (generalized)  Visuospatial deficit  Other symptoms and signs involving the nervous system    Problem List Patient Active Problem List   Diagnosis Date Noted  . Normocytic anemia 01/08/2021  . Acute CVA (cerebrovascular accident) (HCC) 01/07/2021  .  Pre-diabetes 01/07/2021  . Essential hypertension 01/06/2021  . Hyperlipidemia 01/06/2021  . Hypothyroidism 01/06/2021  . Genetic testing 01/28/2018  . S/P total knee replacement 01/04/2018  . Family history of breast cancer   . 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 Aneurysm of splenic artery (HCC) 08/23/2012    Junious Dresser MOT, OTR/L  01/29/2021, 2:16 PM  [facility] [address] [place], [place], [address] Phone: [phone]   Fax:  [phone]  Name: Crystal Trevino MRN: [id number] Date of Birth: Mar 25, 1941

## 2021-01-29 NOTE — Progress Notes (Signed)
ILR wound check in clinic. Steri-strips removed prior to appointment. Wound well healed. Home monitor transmitting nightly. No episodes. Questions answered.

## 2021-01-29 NOTE — Patient Instructions (Addendum)
**  Bring your tablet with you tomorrow so we can download the Constant Therapy App     Memory Compensation Strategies  1. Use "WARM" strategy. W= write it down A=  associate it R=  repeat it M=  make a mental picture  2. You can keep a Social worker. Use a 3-ring notebook with sections for the following:  calendar, important names and phone numbers, medications, doctors' names/phone numbers, "to do list"/reminders, and a section to journal what you did each day  3. Use a calendar to write appointments down.  4. Write yourself a schedule for the day.  This can be placed on the calendar or in a separate section of the Memory Notebook.  Keeping a regular schedule can help memory.  5. Use medication organizer with sections for each day or morning/evening pills  You may need help loading it  6. Keep a basket, or pegboard by the door.   Place items that you need to take out with you in the basket or on the pegboard.  You may also want to include a message board for reminders.  7. Use sticky notes. Place sticky notes with reminders in a place where the task is performed.  For example:  "turn off the stove" placed by the stove, "lock the door" placed on the door at eye level, "take your medications" on the bathroom mirror or by the place where you normally take your medications  8. Use alarms/timers.  Use while cooking to remind yourself to check on food or as a reminder to take your medicine, or as a reminder to make a call, or as a reminder to perform another task, etc.  9. Use a voice recorder app or small tape recorder to record important information and notes for yourself. Go back at the end of the day and listen to these.     **Text 5 people every day to practice muscle memory for finding the keys

## 2021-01-29 NOTE — Therapy (Signed)
[facility] [address] [place], [place], [address] Phone: [phone]   Fax:  [phone]  Physical Therapy Treatment  Patient Details  Name: Crystal Trevino MRN: [id number] Date of Birth: 02/14/1941 Referring Provider (PT): Ferne Coe PA   Encounter Date: 01/29/2021   PT End of Session - 01/29/21 1605    Visit Number 2    Number of Visits 12    Date for PT Re-Evaluation 03/28/21    Authorization Type BCBS federal    PT Start Time 1315    PT Stop Time 1400    PT Time Calculation (min) 45 min    Equipment Utilized During Treatment Gait belt    Activity Tolerance Patient tolerated treatment well    Behavior During Therapy [facility] for tasks assessed/performed;Flat affect           Past Medical History:  Diagnosis Date  . Arthritis   . Breast CA ([place])    right breast cancer  . Cancer ([place])    HAD RIGHT BREAST BX --GOING TO [facility] 12/30/2017  . Constipation 08/13/2011  . Dizziness 08/13/2011  . Family history of adverse reaction to anesthesia    pt mother had PONV  . Family history of breast cancer   . Genetic testing 01/28/2018   Multi-Cancer panel (83 genes) @ [facility] - No pathogenic mutations detected  . GERD (gastroesophageal reflux disease)   . Heart murmur    "SLIGHT HEART MURMUR"  WAS PICKED UP AS AN ADULT  . Hyperlipidemia   . Hypertension   . Hypothyroidism   . Osteoarthritis of knee    Right  . Peripheral vascular disease ([place])   . Reflux   . Splenic artery aneurysm (HCC)    stable 1.5cm by 08/2013 CT; no further surveillance recommended  . Weight loss     Past Surgical History:  Procedure Laterality Date  . ABDOMINAL HYSTERECTOMY    . BACK SURGERY    . BREAST LUMPECTOMY WITH RADIOACTIVE SEED LOCALIZATION Right 02/19/2018   Procedure: BREAST LUMPECTOMY WITH RADIOACTIVE SEED LOCALIZATION. RIGHT;  Surgeon: Alphonsa Overall, MD;  Location: [place];  Service: General;  Laterality: Right;   . BREAST SURGERY    . CHOLECYSTECTOMY    . KNEE SURGERY    . LOOP RECORDER INSERTION N/A 01/10/2021   Procedure: LOOP RECORDER INSERTION;  Surgeon: Constance Haw, MD;  Location: [place] CV LAB;  Service: Cardiovascular;  Laterality: N/A;  . LUMBAR LAMINECTOMY    . TOTAL KNEE ARTHROPLASTY Right 01/04/2018  . TOTAL KNEE ARTHROPLASTY Right 01/04/2018   Procedure: TOTAL KNEE ARTHROPLASTY;  Surgeon: Vickey Huger, MD;  Location: [place];  Service: Orthopedics;  Laterality: Right;  Marland Kitchen VAGINAL PROLAPSE REPAIR     2015   DONE IN [place]  . VAGINAL PROLAPSE REPAIR  05/20/2018    There were no vitals filed for this visit.   Subjective Assessment - 01/29/21 1604    Subjective Patient denies any gait or balnce issues but DTR reports some problems with R side neglect    Patient is accompained by: Family member   son [name]   How long can you sit comfortably? n/a    How long can you stand comfortably? n/a    How long can you walk comfortably? unlimited per patient    Currently in Pain? No/denies              [facility] PT Assessment - 01/29/21 1612      Functional  Gait  Assessment   Gait assessed  Yes    Gait Level Surface Walks 20 ft in less than 7 sec but greater than 5.5 sec, uses assistive device, slower speed, mild gait deviations, or deviates 6-10 in outside of the 12 in walkway width.    Change in Gait Speed Able to change speed, demonstrates mild gait deviations, deviates 6-10 in outside of the 12 in walkway width, or no gait deviations, unable to achieve a major change in velocity, or uses a change in velocity, or uses an assistive device.    Gait with Horizontal Head Turns Performs head turns smoothly with slight change in gait velocity (eg, minor disruption to smooth gait path), deviates 6-10 in outside 12 in walkway width, or uses an assistive device.    Gait with Vertical Head Turns Performs head turns with no change in gait. Deviates no more than 6 in outside 12 in walkway width.     Gait and Pivot Turn Pivot turns safely in greater than 3 sec and stops with no loss of balance, or pivot turns safely within 3 sec and stops with mild imbalance, requires small steps to catch balance.    Step Over Obstacle Is able to step over 2 stacked shoe boxes taped together (9 in total height) without changing gait speed. No evidence of imbalance.    Gait with Narrow Base of Support Is able to ambulate for 10 steps heel to toe with no staggering.    Gait with Eyes Closed Walks 20 ft, no assistive devices, good speed, no evidence of imbalance, normal gait pattern, deviates no more than 6 in outside 12 in walkway width. Ambulates 20 ft in less than 7 sec.    Ambulating Backwards Walks 20 ft, no assistive devices, good speed, no evidence for imbalance, normal gait    Steps Alternating feet, must use rail.    Total Score 25                         [facility] Adult PT Treatment/Exercise - 01/29/21 1612      Ambulation/Gait   Ambulation/Gait Yes    Ambulation/Gait Assistance 5: Supervision;4: Min guard    Ambulation/Gait Assistance Details carried 5# kettle bell in R for 151ft    Ambulation Distance (Feet) 460 Feet    Assistive device Straight cane    Gait Pattern Step-through pattern    Ambulation Surface Level;Indoor    Gait Comments required VCs to not look down constantly, incorporated head turns and nods, added stepping over orange stripes               Balance Exercises - 01/29/21 0001      Balance Exercises: Standing   Standing Eyes Opened Narrow base of support (BOS);Head turns;Solid surface;2 reps;30 secs;Limitations    Standing Eyes Opened Limitations 30s x2, then head turns/nods 10x ea.    Standing Eyes Closed Narrow base of support (BOS);Head turns;Solid surface;2 reps;30 secs;Limitations    Standing Eyes Closed Limitations 30s x2, then head turns/nods 10x ea.             PT Education - 01/29/21 1605    Education Details corner balance exercises of  standing for 30s x3    Person(s) Educated Patient;Child(ren)    Methods Explanation;Demonstration;Handout    Comprehension Verbalized understanding;Returned demonstration;Need further instruction            PT Short Term Goals - 01/24/21 1206      PT  SHORT TERM GOAL #1   Title Demo HEP back to PT w/o need of VCs    Baseline S required for HPE    Time 4    Period Weeks    Status New    Target Date 02/21/21      PT SHORT TERM GOAL #2   Title ambulation of 372ft with LRAD and S across level ground    Baseline in clinic ambulation with close S and SPC    Time 4    Period Weeks    Status New    Target Date 02/21/21      PT SHORT TERM GOAL #3   Title assess FGA and establish    Baseline n/a    Time 4    Period Weeks    Status New    Target Date 02/21/21             PT Long Term Goals - 01/24/21 1218      PT LONG TERM GOAL #1   Title perform 5x STS in 15s or less w/o need of UE support    Baseline 20.5s with UE support    Time 8    Period Weeks    Status New    Target Date 03/21/21      PT LONG TERM GOAL #2   Title Ambulate 1013ft across unlevel and level ground under distant S with LRAD    Baseline 148ft across level ground with close S and SPC    Time 8    Period Weeks    Status New    Target Date 03/21/21      PT LONG TERM GOAL #3   Title Perform standard TUG in 13 seconds or less with LRAD    Baseline 16.2 s with SPC    Time 8    Period Weeks    Status New    Target Date 03/21/21                 Plan - 01/29/21 1606    Clinical Impression Statement skilled session to address balance deficits and mild R side neglect(which patient denies), able to ambulate w/o need of AD under close S/CGA, assessed FGA, demos relutance to rotate fully R in c-spine despite full ROM, some delay in processing tasks/commands, requires cuing to remain on tasks and not drift    Personal Factors and Comorbidities Age;Comorbidity 2    Comorbidities CVA     Stability/Clinical Decision Making Stable/Uncomplicated    Rehab Potential Good    PT Frequency 2x / week    PT Duration 8 weeks    PT Treatment/Interventions Aquatic Therapy;DME Instruction;Gait training;Stair training;Functional mobility training;Therapeutic activities;Therapeutic exercise;Balance training;Neuromuscular re-education;Patient/family education;Orthotic Fit/Training    PT Next Visit Plan continue gait and balance training, address R side neglect and patient goal of returning to carrying things    PT Home Exercise Plan fall prevention and 5x STS transfers under S    Consulted and Agree with Plan of Care Patient;Family member/caregiver    Family Member Consulted son [name]           Patient will benefit from skilled therapeutic intervention in order to improve the following deficits and impairments:  Abnormal gait,Decreased balance,Decreased endurance,Difficulty walking,Decreased activity tolerance,Decreased coordination,Decreased safety awareness,Decreased strength  Visit Diagnosis: Unsteadiness on feet  Muscle weakness (generalized)     Problem List Patient Active Problem List   Diagnosis Date Noted  . Normocytic anemia 01/08/2021  . Acute CVA (cerebrovascular accident) ([place]) 01/07/2021  .  Pre-diabetes 01/07/2021  . Essential hypertension 01/06/2021  . Hyperlipidemia 01/06/2021  . Hypothyroidism 01/06/2021  . Genetic testing 01/28/2018  . S/P total knee replacement 01/04/2018  . Family history of breast cancer   . 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 Aneurysm of splenic artery ([place]) 08/23/2012    Lanice Shirts PT 01/29/2021, 5:58 PM  [facility] [address] [place], [place], [address] Phone: [phone]   Fax:  [phone]  Name: Crystal Trevino MRN: [id number] Date of Birth: June 13, 1941

## 2021-01-29 NOTE — Patient Instructions (Signed)
1. Grip Strengthening (Resistive Putty)   Squeeze putty using thumb and all fingers. Repeat _20___ times. Do __2__ sessions per day.   2. Roll putty into tube on table and pinch between each finger and thumb x 10 reps each. (can do ring and small finger together)  Finger / Thumb Extensors    Place right fingers and thumb in center of putty donut, stretch out. Repeat ____ times. Do ____ sessions per day.  MP Flexion (Resistive Putty)    Bending only at large knuckles, press putty down against thumb. Keep fingertips straight. Repeat ____ times. Do ____ sessions per day.  Copyright  VHI. All rights reserved.

## 2021-01-29 NOTE — Therapy (Signed)
[facility] [address] [place], [place], [address] Phone: [phone]   Fax:  [phone]  Speech Language Pathology Treatment  Patient Details  Name: Crystal Trevino MRN: [id number] Date of Birth: November 06, 1941 Referring Provider (SLP): Ferne Coe PA ((PCP: [place] Lank, Wendy-documentation))   Encounter Date: 01/29/2021   End of Session - 01/29/21 1556    Visit Number 2    Number of Visits 17    Date for SLP Re-Evaluation 04/23/21    SLP Start Time 6962    SLP Stop Time  9528    SLP Time Calculation (min) 40 min    Activity Tolerance Patient tolerated treatment well           Past Medical History:  Diagnosis Date  . Arthritis   . Breast CA ([place])    right breast cancer  . Cancer ([place])    HAD RIGHT BREAST BX --GOING [facility] 12/30/2017  . Constipation 08/13/2011  . Dizziness 08/13/2011  . Family history of adverse reaction to anesthesia    pt mother had PONV  . Family history of breast cancer   . Genetic testing 01/28/2018   Multi-Cancer panel (83 genes) @ [facility] - No pathogenic mutations detected  . GERD (gastroesophageal reflux disease)   . Heart murmur    "SLIGHT HEART MURMUR"  WAS PICKED UP AS AN ADULT  . Hyperlipidemia   . Hypertension   . Hypothyroidism   . Osteoarthritis of knee    Right  . Peripheral vascular disease ([place])   . Reflux   . Splenic artery aneurysm ([facility])    stable 1.5cm by 08/2013 CT; no further surveillance recommended  . Weight loss     Past Surgical History:  Procedure Laterality Date  . ABDOMINAL HYSTERECTOMY    . BACK SURGERY    . BREAST LUMPECTOMY WITH RADIOACTIVE SEED LOCALIZATION Right 02/19/2018   Procedure: BREAST LUMPECTOMY WITH RADIOACTIVE SEED LOCALIZATION. RIGHT;  Surgeon: Alphonsa Overall, MD;  Location: [facility];  Service: General;  Laterality: Right;  . BREAST SURGERY    . CHOLECYSTECTOMY    . KNEE SURGERY    . LOOP RECORDER INSERTION N/A 01/10/2021    Procedure: LOOP RECORDER INSERTION;  Surgeon: Constance Haw, MD;  Location: [facility];  Service: Cardiovascular;  Laterality: N/A;  . LUMBAR LAMINECTOMY    . TOTAL KNEE ARTHROPLASTY Right 01/04/2018  . TOTAL KNEE ARTHROPLASTY Right 01/04/2018   Procedure: TOTAL KNEE ARTHROPLASTY;  Surgeon: Vickey Huger, MD;  Location: [facility];  Service: Orthopedics;  Laterality: Right;  Marland Kitchen VAGINAL PROLAPSE REPAIR     2015   DONE IN [place]  . VAGINAL PROLAPSE REPAIR  05/20/2018    There were no vitals filed for this visit.   Subjective Assessment - 01/29/21 1544    Subjective "OT & PT"    Patient is accompained by: Family member   [name]   Currently in Pain? No/denies    Pain Score 0-No pain                 ADULT SLP TREATMENT - 01/29/21 1407      General Information   Behavior/Cognition Alert   flat affect     Treatment Provided   Treatment provided Cognitive-Linquistic      Cognitive-Linquistic Treatment   Treatment focused on Cognition;Aphasia;Patient/family/caregiver education    Skilled Treatment Pt verbally recalled working on balance and hand grips in PT/OT before ST session. SLP reviewed results of CLQT with  patient and daughter [name] to highlight decreased error awareness and possible limited insight into deficits. Mod prompting required to verbally ID functional changes within home environment. Pt reported she had trouble recalling DOW, with family providing digital calendar. [name] reported that Dot loaded washer and forgot to turn it on x1 since being home. Increased difficulty with remembering keys for texting. SLP had pt communicate via text, with pt able to ID keys with no A. Min A required for error awareness for spelling. No concerns for finanical management reported by patient or daughter. Family providing medicines in pill box. Overall word finding and speech has improved since CVA per daughter and patient. Flat affect with minimal eye contact noted throughout  session. Decreased motivation suspected; therefore, SLP endorsed pt may decreased freq to 1x/week if progress continues. SLP recommended Constant Therapy for further cognitive practice opportunities at home.      Assessment / Recommendations / Plan   Plan Continue with current plan of care      Progression Toward Goals   Progression toward goals Progressing toward goals            SLP Education - 01/29/21 1556    Education Details memory compensations, texting practice, Constant Therapy    Person(s) Educated Patient;Child(ren)    Methods Explanation;Demonstration;Handout    Comprehension Verbalized understanding;Returned demonstration            SLP Short Term Goals - 01/29/21 1350      SLP SHORT TERM GOAL #1   Title Pt will verbalize daily schedule, recent events and salient information using external memory aids with min A over 3 sessions    Time 4    Period Weeks    Status On-going      SLP SHORT TERM GOAL #2   Title Pt will verbalize reasonable/safe solutions to functional safety scenarios at home (transfers, cooking, meds etc) and carry these over with min A from caregivers    Time 4    Period Weeks    Status On-going      SLP SHORT TERM GOAL #3   Title Pt will comprehend multi step directions to complete iADL tasks of personal relevance with min A cues over 3 sessions    Time 4    Period Weeks    Status On-going      SLP SHORT TERM GOAL #4   Title Pt will verbalize and demonstrate compensations for error awareness with min A over 3 sessions    Time 4    Period Weeks    Status On-going      SLP SHORT TERM GOAL #5   Title Pt will use word finding compensations in 10 minute mod complex conversation with min A over 3 sessions    Time 4    Status On-going            SLP Long Term Goals - 01/29/21 1351      SLP LONG TERM GOAL #1   Title Pt will verbalize accurate usage and implemention of external memory aids at home with rare min A from caregiver over 2  sessions    Time 8    Period Weeks    Status On-going      SLP LONG TERM GOAL #2   Title Pt will comprehend multi step directions to complete iADL tasks of personal relevance with rare min A cues fronm caregiver between 3 sessions    Time 8    Period Weeks    Status On-going  SLP LONG TERM GOAL #3   Title Pt will verbalize and demonstrate compensations for error awareness within home environment with rare min A from caregivers between 3 sessions    Time 8    Period Weeks    Status On-going      SLP LONG TERM GOAL #4   Title Pt will use word finding compensations in 15 minute mod complex conversation with rare min A over 3 sessions    Time 8    Period Weeks    Status On-going            Plan - 01/29/21 1556    Clinical Impression Statement Crystal "Dot" presents with mild cognitive linguistic deficits and mild aphasia, which appears to be resolving. SLP reviewed CLQT results to highlight decreased error awareness, which pt able to correct those errors with occasional min A. Per patient and daughter, improvements in speech and cognition noted with minimal concerns related to home environment. Mod prompting required to facilitate conversation with patient, with limited verbal output and eye contact noted. SLP recommends continuation of ST servcies to address cognitive communication skills to optimize functional independence, safety, and communication for possible return to PLOF.    Speech Therapy Frequency 2x / week    Duration 8 weeks    Treatment/Interventions SLP instruction and feedback;Internal/external aids;Compensatory techniques;Functional tasks;Patient/family education    Potential to Achieve Goals Good    Potential Considerations Cooperation/participation level    SLP Home Exercise Plan provided    Consulted and Agree with Plan of Care Patient;Family member/caregiver           Patient will benefit from skilled therapeutic intervention in order to improve the  following deficits and impairments:   Aphasia  Cognitive communication deficit    Problem List Patient Active Problem List   Diagnosis Date Noted  . Normocytic anemia 01/08/2021  . Acute CVA (cerebrovascular accident) ([facility]) 01/05/2021  . Pre-diabetes 01/03/2021  . Essential hypertension 01/06/2021  . Hyperlipidemia 01/06/2021  . Hypothyroidism 01/06/2021  . Genetic testing 01/28/2018  . S/P total knee replacement 01/04/2018  . Family history of breast cancer   . 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 Aneurysm of splenic artery ([facility]) 08/23/2012    Alinda Deem, MA CCC-SLP 01/29/2021, 4:01 PM  [facility] [address] [place], [place], [address] Phone: [phone]   Fax:  [phone]   Name: Crystal Trevino MRN: [id number] Date of Birth: Dec 23, 1940

## 2021-01-30 ENCOUNTER — Ambulatory Visit: Payer: Federal, State, Local not specified - PPO | Admitting: Physical Therapy

## 2021-01-30 ENCOUNTER — Ambulatory Visit: Payer: Federal, State, Local not specified - PPO | Admitting: Occupational Therapy

## 2021-01-30 ENCOUNTER — Encounter: Payer: Self-pay | Admitting: Physical Therapy

## 2021-01-30 ENCOUNTER — Ambulatory Visit: Payer: Federal, State, Local not specified - PPO

## 2021-01-30 DIAGNOSIS — I69853 Hemiplegia and hemiparesis following other cerebrovascular disease affecting right non-dominant side: Secondary | ICD-10-CM

## 2021-01-30 DIAGNOSIS — R41842 Visuospatial deficit: Secondary | ICD-10-CM

## 2021-01-30 DIAGNOSIS — R4701 Aphasia: Secondary | ICD-10-CM

## 2021-01-30 DIAGNOSIS — R2681 Unsteadiness on feet: Secondary | ICD-10-CM

## 2021-01-30 DIAGNOSIS — R41840 Attention and concentration deficit: Secondary | ICD-10-CM

## 2021-01-30 DIAGNOSIS — R41841 Cognitive communication deficit: Secondary | ICD-10-CM

## 2021-01-30 NOTE — Therapy (Signed)
[facility] [address] [place], [place], [address] Phone: [phone]   Fax:  [phone]  Speech Language Pathology Treatment  Patient Details  Name: Crystal Trevino MRN: [id number] Date of Birth: 10/23/1941 Referring Provider (SLP): Ferne Coe PA ((PCP: [place] Lank, Wendy-documentation))   Encounter Date: 01/30/2021   End of Session - 01/30/21 0946    Visit Number 3    Number of Visits 17    Date for SLP Re-Evaluation 04/23/21    SLP Start Time 0800    SLP Stop Time  0843    SLP Time Calculation (min) 43 min    Activity Tolerance Patient tolerated treatment well           Past Medical History:  Diagnosis Date  . Arthritis   . Breast CA ([facility])    right breast cancer  . Cancer ([facility])    HAD RIGHT BREAST BX --[facility]ING [facility] 12/30/2017  . Constipation 08/13/2011  . Dizziness 08/13/2011  . Family history of adverse reaction to anesthesia    pt mother had PONV  . Family history of breast cancer   . Genetic testing 01/28/2018   Multi-Cancer panel (83 genes) @ [facility] - No pathogenic mutations detected  . GERD (gastroesophageal reflux disease)   . Heart murmur    "SLIGHT HEART MURMUR"  WAS PICKED UP AS AN ADULT  . Hyperlipidemia   . Hypertension   . Hypothyroidism   . Osteoarthritis of knee    Right  . Peripheral vascular disease ([facility])   . Reflux   . Splenic artery aneurysm ([facility])    stable 1.5cm by 08/2013 CT; no further surveillance recommended  . Weight loss     Past Surgical History:  Procedure Laterality Date  . ABDOMINAL HYSTERECTOMY    . BACK SURGERY    . BREAST LUMPECTOMY WITH RADIOACTIVE SEED LOCALIZATION Right 02/19/2018   Procedure: BREAST LUMPECTOMY WITH RADIOACTIVE SEED LOCALIZATION. RIGHT;  Surgeon: Alphonsa Overall, MD;  Location: [facility];  Service: General;  Laterality: Right;  . BREAST SURGERY    . CHOLECYSTECTOMY    . KNEE SURGERY    . LOOP RECORDER INSERTION N/A 01/10/2021    Procedure: LOOP RECORDER INSERTION;  Surgeon: Constance Haw, MD;  Location: [facility];  Service: Cardiovascular;  Laterality: N/A;  . LUMBAR LAMINECTOMY    . TOTAL KNEE ARTHROPLASTY Right 01/04/2018  . TOTAL KNEE ARTHROPLASTY Right 01/04/2018   Procedure: TOTAL KNEE ARTHROPLASTY;  Surgeon: Vickey Huger, MD;  Location: [facility];  Service: Orthopedics;  Laterality: Right;  Marland Kitchen VAGINAL PROLAPSE REPAIR     2015   DONE IN [place]  . VAGINAL PROLAPSE REPAIR  05/20/2018    There were no vitals filed for this visit.   Subjective Assessment - 01/30/21 0801    Subjective "I was tired yesterday"    Patient is accompained by: Family member   [name]   Currently in Pain? No/denies                 ADULT SLP TREATMENT - 01/30/21 0805      General Information   Behavior/Cognition Alert;Cooperative;Pleasant mood      Treatment Provided   Treatment provided Cognitive-Linquistic      Cognitive-Linquistic Treatment   Treatment focused on Cognition;Aphasia;Patient/family/caregiver education    Skilled Treatment Pt noted with improved mood, participation, and engagement this session (reportedly related to sleep). Increased verbal output demonstrated without frequency of prompting last session. SLP introduced Constant Therapy.  SLP attempted to set up app on pt's iPad; however, family unable to recall apple password. Task to be completed at home. Family reported confusion last night with pt waking multiple times when dogs barking, in which pt made coffee and feed animals at 3 AM. Family cued patient to return to bed and repositioned clock/calendar within eye sight in bedroom. First occurance of confusion reported, with no other cognitive linguistic deficit examples provided for home environment. SLP reviewed need for double checking for day to day tasks given decline in awareness, in which pt and family verbalized understanding. SLP introduced opportunites to increase return to independence,  including med management. Pt verbalized she would like to return to feeding animals, driving self to grocery store/quilt store, and returning to [employer] classes.      Assessment / Recommendations / Plan   Plan Continue with current plan of care      Progression Toward Goals   Progression toward goals Progressing toward goals            SLP Education - 01/30/21 0946    Education Details constant therapy, double check    Person(s) Educated Patient;Child(ren)   [name] son   Methods Explanation;Demonstration;Handout    Comprehension Verbalized understanding;Returned demonstration            SLP Short Term Goals - 01/30/21 0948      SLP SHORT TERM GOAL #1   Title Pt will verbalize daily schedule, recent events and salient information using external memory aids with min A over 3 sessions    Baseline 01-30-21    Time 4    Period Weeks    Status On-going      SLP SHORT TERM GOAL #2   Title Pt will verbalize reasonable/safe solutions to functional safety scenarios at home (transfers, cooking, meds etc) and carry these over with min A from caregivers    Time 4    Period Weeks    Status On-going      SLP SHORT TERM GOAL #3   Title Pt will comprehend multi step directions to complete iADL tasks of personal relevance with min A cues over 3 sessions    Time 4    Period Weeks    Status On-going      SLP SHORT TERM GOAL #4   Title Pt will verbalize and demonstrate compensations for error awareness with min A over 3 sessions    Time 4    Period Weeks    Status On-going      SLP SHORT TERM GOAL #5   Title Pt will use word finding compensations in 10 minute mod complex conversation with min A over 3 sessions    Time 4    Status On-going            SLP Long Term Goals - 01/30/21 0949      SLP LONG TERM GOAL #1   Title Pt will verbalize accurate usage and implemention of external memory aids at home with rare min A from caregiver over 2 sessions    Time 8    Period Weeks     Status On-going      SLP LONG TERM GOAL #2   Title Pt will comprehend multi step directions to complete iADL tasks of personal relevance with rare min A cues fronm caregiver between 3 sessions    Time 8    Period Weeks    Status On-going      SLP LONG TERM GOAL #3  Title Pt will verbalize and demonstrate compensations for error awareness within home environment with rare min A from caregivers between 3 sessions    Time 8    Period Weeks    Status On-going      SLP LONG TERM GOAL #4   Title Pt will use word finding compensations in 15 minute mod complex conversation with rare min A over 3 sessions    Time [place] - 01/30/21 0949    Clinical Impression Statement Crystal "Dot" presents with mild cognitive linguistic deficits and mild aphasia, which appears to be resolving. Per patient and children, improvements in speech and cognition noted with minimal concerns related to home environment. Mild confusion reported last night with pt waking multiple times and starting morning routine at 3 AM revealing persistent decreased error awareness. Less frequent prompting required to facilitate conversation with patient, with improvements in verbal output and eye contact noted. SLP recommends continuation of ST servcies to address cognitive communication skills to optimize functional independence, safety, and communication for possible return to PLOF.           Patient will benefit from skilled therapeutic intervention in order to improve the following deficits and impairments:   Aphasia  Cognitive communication deficit    Problem List Patient Active Problem List   Diagnosis Date Noted  . Normocytic anemia 01/08/2021  . Acute CVA (cerebrovascular accident) ([facility]) 01/07/2021  . Pre-diabetes 01/07/2021  . Essential hypertension 01/06/2021  . Hyperlipidemia 01/06/2021  . Hypothyroidism 01/06/2021  . Genetic testing 01/28/2018  . S/P total  knee replacement 01/04/2018  . Family history of breast cancer   . 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 Aneurysm of splenic artery ([facility]) 08/23/2012    Alinda Deem, MA CCC-SLP 01/30/2021, 9:51 AM  [facility] [address] [place], [place], [address] Phone: [phone]   Fax:  [phone]   Name: Crystal Trevino MRN: [id number] Date of Birth: 1941-05-31

## 2021-01-30 NOTE — Therapy (Signed)
[facility] [address] [place], [place], [address] Phone: [phone]   Fax:  [phone]  Physical Therapy Treatment  Patient Details  Name: Crystal Trevino MRN: [id number] Date of Birth: 1941/11/19 Referring Provider (PT): Ferne Coe PA   Encounter Date: 01/30/2021   PT End of Session - 01/30/21 0719    Visit Number 3    Number of Visits 12    Date for PT Re-Evaluation 03/28/21    Authorization Type BCBS federal    PT Start Time [phone]    PT Stop Time 0800    PT Time Calculation (min) 42 min    Equipment Utilized During Treatment Gait belt    Activity Tolerance Patient tolerated treatment well    Behavior During Therapy [facility] for tasks assessed/performed;Flat affect           Past Medical History:  Diagnosis Date  . Arthritis   . Breast CA ([place])    right breast cancer  . Cancer ([place])    HAD RIGHT BREAST BX --[facility]ING [facility] 12/30/2017  . Constipation 08/13/2011  . Dizziness 08/13/2011  . Family history of adverse reaction to anesthesia    pt mother had PONV  . Family history of breast cancer   . Genetic testing 01/28/2018   Multi-Cancer panel (83 genes) @ [facility] - No pathogenic mutations detected  . GERD (gastroesophageal reflux disease)   . Heart murmur    "SLIGHT HEART MURMUR"  WAS PICKED UP AS AN ADULT  . Hyperlipidemia   . Hypertension   . Hypothyroidism   . Osteoarthritis of knee    Right  . Peripheral vascular disease ([place])   . Reflux   . Splenic artery aneurysm ([facility])    stable 1.5cm by 08/2013 CT; no further surveillance recommended  . Weight loss     Past Surgical History:  Procedure Laterality Date  . ABDOMINAL HYSTERECTOMY    . BACK SURGERY    . BREAST LUMPECTOMY WITH RADIOACTIVE SEED LOCALIZATION Right 02/19/2018   Procedure: BREAST LUMPECTOMY WITH RADIOACTIVE SEED LOCALIZATION. RIGHT;  Surgeon: Alphonsa Overall, MD;  Location: [facility];  Service: General;  Laterality: Right;   . BREAST SURGERY    . CHOLECYSTECTOMY    . KNEE SURGERY    . LOOP RECORDER INSERTION N/A 01/10/2021   Procedure: LOOP RECORDER INSERTION;  Surgeon: Constance Haw, MD;  Location: [facility];  Service: Cardiovascular;  Laterality: N/A;  . LUMBAR LAMINECTOMY    . TOTAL KNEE ARTHROPLASTY Right 01/04/2018  . TOTAL KNEE ARTHROPLASTY Right 01/04/2018   Procedure: TOTAL KNEE ARTHROPLASTY;  Surgeon: Vickey Huger, MD;  Location: [facility];  Service: Orthopedics;  Laterality: Right;  Marland Kitchen VAGINAL PROLAPSE REPAIR     2015   DONE IN [place]  . VAGINAL PROLAPSE REPAIR  05/20/2018    There were no vitals filed for this visit.   Subjective Assessment - 01/30/21 0718    Subjective No new complaints. No falls or pain to report.    Patient is accompained by: Family member   Son   How long can you sit comfortably? n/a    How long can you stand comfortably? n/a    How long can you walk comfortably? unlimited per patient    Currently in Pain? No/denies    Pain Score 0-No pain             01/30/21 0720  Transfers  Transfers Sit to Stand;Stand to Sit  Sit to  Stand 5: Supervision;4: Min guard  Stand to Sit 5: Supervision  Ambulation/Gait  Ambulation/Gait Yes  Ambulation/Gait Assistance 5: Supervision;4: Min guard  Ambulation/Gait Assistance Details around gym with session  Assistive device None  Gait Pattern Step-through pattern  Ambulation Surface Level;Indoor  Neuro Re-ed   Neuro Re-ed Details  for balance/muscle re-ed: gait around track with no device working on speed changes fast<>normal<>slow, scanning all directions randomly and sudden stop/starts with min guard assist, cue to fully turn head toward right side with scanning and for increaesed base of support at times. Then engaged pt in dual tasking wtih gait while self tossing ball, then moving the ball side to side for 1 lap each with min guard assist.       01/30/21 0743  Balance Exercises: Standing  Standing Eyes Closed  Narrow base of support (BOS);Wide (BOA);Head turns;Foam/compliant surface;Solid surface;Other reps (comment);30 secs;Limitations  Standing Eyes Closed Limitations floor: feet together for EC 30 sec's x 3 with supervision, progressing to feet apart for EC head movements left<>right, up<>down for ~10 reps each. min guard assist. progressed to on 2 pillows: feet hip width apart for EC 30 sec's x 3 reps with min guard assist, progressing to feet wider apart for EC head movements left<>right, up<>down for ~8 reps, min guard assist. cues on posture and weight shifting for balance assistance.  Rockerboard Anterior/posterior;EO;EC;30 seconds;Other reps (comment);Limitations  Rockerboard Limitations on balance board with light to no UE support: rocking the board with EO, progressing to EC with min guard to min assist for balance; then holding the board steady for EC 30 sec's x 3 reps. Min guard to min assist for balance with a posterior bias noted. cues on posture and weight shifitng to correct this.  Tandem Gait Forward;Retro;Foam/compliant surface;Upper extremity support;3 reps;Limitations  Tandem Gait Limitations on blue foam beam with cues for posture and step placement for 4 laps each way. min guard assist for safety.  Sidestepping Foam/compliant support;3 reps;Limitations  Sidestepping Limitations on blue foam beam- 1 lap with light UE support, 2 laps no UE support with cues on posture and to lift feet, not slide them. Min guard assist for safety.    Issued the following to HEP this session: Access Code: [email or web address] URL: [email or web address] Date: 01/30/2021 Prepared by: Willow Ora  Exercises Sit to/from Stand in Stride position - 1 x daily - 5 x weekly - 1 sets - 10 reps Standing Balance with Eyes Closed on Foam - 1 x daily - 5 x weekly - 1 sets - 3 reps - 30 hold Wide Stance with Eyes Closed and Head Rotation on Foam Pad - 1 x daily - 5 x weekly - 1 sets - 10 reps     PT Education -  01/30/21 1843    Education Details advanced HEP    Person(s) Educated Patient;Child(ren)    Methods Explanation;Demonstration;Verbal cues;Handout    Comprehension Verbalized understanding;Returned demonstration;Need further instruction               PT Short Term Goals - 01/24/21 1206      PT SHORT TERM GOAL #1   Title Demo HEP back to PT w/o need of VCs    Baseline S required for HPE    Time 4    Period Weeks    Status New    Target Date 02/21/21      PT SHORT TERM GOAL #2   Title ambulation of 388ft with LRAD and S across level ground    Baseline in  clinic ambulation with close S and SPC    Time 4    Period Weeks    Status New    Target Date 02/21/21      PT SHORT TERM GOAL #3   Title assess FGA and establish    Baseline n/a    Time 4    Period Weeks    Status New    Target Date 02/21/21             PT Long Term Goals - 01/24/21 1218      PT LONG TERM GOAL #1   Title perform 5x STS in 15s or less w/o need of UE support    Baseline 20.5s with UE support    Time 8    Period Weeks    Status New    Target Date 03/21/21      PT LONG TERM GOAL #2   Title Ambulate 1069ft across unlevel and level ground under distant S with LRAD    Baseline 161ft across level ground with close S and SPC    Time 8    Period Weeks    Status New    Target Date 03/21/21      PT LONG TERM GOAL #3   Title Perform standard TUG in 13 seconds or less with LRAD    Baseline 16.2 s with [facility]    Time 8    Period Weeks    Status New    Target Date 03/21/21              01/30/21 0719  Plan  Clinical Impression Statement Today's skilled session continued to focus on dynamic gait and balance training with up to min assist needed at times. Also advanced HEP due to progress with corner balance this session with no issues noted or reported by pt. with performance in session. The pt is progressing toward goals and should benefit from continued PT to progress toward unmet goals.   Personal Factors and Comorbidities Age;Comorbidity 2  Comorbidities CVA  Pt will benefit from skilled therapeutic intervention in order to improve on the following deficits Abnormal gait;Decreased balance;Decreased endurance;Difficulty walking;Decreased activity tolerance;Decreased coordination;Decreased safety awareness;Decreased strength  Stability/Clinical Decision Making Stable/Uncomplicated  Rehab Potential Good  PT Frequency 2x / week  PT Duration 8 weeks  PT Treatment/Interventions Aquatic Therapy;DME Instruction;Gait training;Stair training;Functional mobility training;Therapeutic activities;Therapeutic exercise;Balance training;Neuromuscular re-education;Patient/family education;Orthotic Fit/Training  PT Next Visit Plan continue gait and balance training, address R side neglect and patient goal of returning to carrying things  PT Home Exercise Plan Access Code: [email or web address]  Consulted and Agree with Plan of Care Patient;Family member/caregiver  Family Member Consulted pt's son [name]         Patient will benefit from skilled therapeutic intervention in order to improve the following deficits and impairments:  Abnormal gait,Decreased balance,Decreased endurance,Difficulty walking,Decreased activity tolerance,Decreased coordination,Decreased safety awareness,Decreased strength  Visit Diagnosis: Hemiplegia and hemiparesis 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 Normocytic anemia 01/08/2021  . Acute CVA (cerebrovascular accident) ([place]) 01/07/2021  . Pre-diabetes 01/07/2021  . Essential hypertension 01/06/2021  . Hyperlipidemia 01/06/2021  . Hypothyroidism 01/06/2021  . Genetic testing 01/28/2018  . S/P total knee replacement 01/04/2018  . Family history of breast cancer   . 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  Aneurysm of splenic artery ([place]) 08/23/2012  Willow Ora, PTA, [facility] [address] [place], [place] [address] [phone] 01/31/21, 6:47 PM   Name: Crystal Trevino MRN: [id number] Date of Birth: 1941-05-25

## 2021-01-30 NOTE — Therapy (Signed)
[facility] [address] [place], [place], [address] Phone: [phone]   Fax:  [phone]  Occupational Therapy Treatment  Patient Details  Name: Crystal Trevino MRN: [id number] Date of Birth: March 04, 1941 Referring Provider (OT): Leonides Schanz [place] ([place])   Encounter Date: 01/30/2021   OT End of Session - 01/30/21 0930    Visit Number 3    Number of Visits 13    Date for OT Re-Evaluation 03/21/21    Authorization Type BCBS Federal    Authorization Time Period 78 PT/OT/ST $25 copay    Authorization - Visit Number 2    Authorization - Number of Visits 25    OT Start Time 0845    OT Stop Time 0925    OT Time Calculation (min) 40 min    Activity Tolerance Patient tolerated treatment well    Behavior During Therapy [facility] for tasks assessed/performed;Flat affect           Past Medical History:  Diagnosis Date  . Arthritis   . Breast CA ([place])    right breast cancer  . Cancer ([place])    HAD RIGHT BREAST BX --[facility]ING [facility] 12/30/2017  . Constipation 08/13/2011  . Dizziness 08/13/2011  . Family history of adverse reaction to anesthesia    pt mother had PONV  . Family history of breast cancer   . Genetic testing 01/28/2018   Multi-Cancer panel (83 genes) @ [facility] - No pathogenic mutations detected  . GERD (gastroesophageal reflux disease)   . Heart murmur    "SLIGHT HEART MURMUR"  WAS PICKED UP AS AN ADULT  . Hyperlipidemia   . Hypertension   . Hypothyroidism   . Osteoarthritis of knee    Right  . Peripheral vascular disease ([place])   . Reflux   . Splenic artery aneurysm ([facility])    stable 1.5cm by 08/2013 CT; no further surveillance recommended  . Weight loss     Past Surgical History:  Procedure Laterality Date  . ABDOMINAL HYSTERECTOMY    . BACK SURGERY    . BREAST LUMPECTOMY WITH RADIOACTIVE SEED LOCALIZATION Right 02/19/2018   Procedure: BREAST LUMPECTOMY WITH RADIOACTIVE SEED LOCALIZATION.  RIGHT;  Surgeon: Alphonsa Overall, MD;  Location: [facility];  Service: General;  Laterality: Right;  . BREAST SURGERY    . CHOLECYSTECTOMY    . KNEE SURGERY    . LOOP RECORDER INSERTION N/A 01/10/2021   Procedure: LOOP RECORDER INSERTION;  Surgeon: Constance Haw, MD;  Location: [facility];  Service: Cardiovascular;  Laterality: N/A;  . LUMBAR LAMINECTOMY    . TOTAL KNEE ARTHROPLASTY Right 01/04/2018  . TOTAL KNEE ARTHROPLASTY Right 01/04/2018   Procedure: TOTAL KNEE ARTHROPLASTY;  Surgeon: Vickey Huger, MD;  Location: [facility];  Service: Orthopedics;  Laterality: Right;  Marland Kitchen VAGINAL PROLAPSE REPAIR     2015   DONE IN [place]  . VAGINAL PROLAPSE REPAIR  05/20/2018    There were no vitals filed for this visit.   Subjective Assessment - 01/30/21 0849    Pertinent History PMHx: R breast cancer s/p lumpectomy and HTN.    Limitations Fall Risk. Loop recorder    Patient Stated Goals Pt reported no deficits.    Currently in Pain? No/denies            Assembling 24 pc puzzle w/ cues to strategize and look to far Rt bottom corner. Pt required extra time and min cues.  Environmental scanning finding 9/12 items  on first pass w/ min cues to look Rt and Lt and slow down. Pt missed 2 on Rt and 1 directly in front. Pt required cues to find remaining items on 2nd pass.  Pt issued visual scanning strategies and tasks to do at home - see pt instructions. Reviewed w/ patient/family.                     OT Education - 01/30/21 0913    Education Details Visual scanning strategies and activities to encourage scanning    Person(s) Educated Patient   family (son?)   Methods Explanation;Handout    Comprehension Verbalized understanding            OT Short Term Goals - 01/30/21 0930      OT SHORT TERM GOAL #1   Title Pt will be independent with HEP for grip strengthening RUE 02/21/21    Time 4    Period Weeks    Status On-going    Target Date 02/21/21      OT SHORT TERM GOAL  #2   Title Pt will perform environmental scanning with 90% accuracy or greater.    Baseline 80% with all 3 errors on right    Time 4    Period Weeks    Status On-going      OT SHORT TERM GOAL #3   Title Pt will perform warm meal prep and/or home management tasks with mod I    Time 4    Period Weeks    Status New      OT SHORT TERM GOAL #4   Title Pt will improve grip strength in RUE by 5 lbs in order to increase functional use of RUE    Baseline RUE 27.9 lbs, LUE 45.4 lbs    Time 4    Period Weeks    Status On-going             OT Long Term Goals - 01/24/21 1244      OT LONG TERM GOAL #1   Title Pt will be independent with any updated HEP 03/21/21    Time 8    Period Weeks    Status New    Target Date 03/21/21      OT LONG TERM GOAL #2   Title Pt will perform physical and cognitive task simultaneously with 95% accuracy    Time 8    Period Weeks    Status New      OT LONG TERM GOAL #3   Title Pt will return to prior level of functioning for cooking and home management.    Time 8    Period Weeks    Status New      OT LONG TERM GOAL #4   Title Pt will improve grip strength to 35 lbs or greater in RUE    Baseline RUE 27.9 lbs    Time 8    Period Weeks    Status New                 Plan - 01/30/21 0931    Clinical Impression Statement Pt w/ decreased awareness into deficits and decreased attention to Rt side    OT Occupational Profile and History Problem Focused Assessment - Including review of records relating to presenting problem    Occupational performance deficits (Please refer to evaluation for details): IADL's;Leisure;ADL's    Body Structure / Function / Physical Skills ADL;Coordination;IADL;GMC;Dexterity;Decreased knowledge of use of DME;FMC;UE functional use;Vision  Cognitive Skills Attention;Safety Awareness;Perception;Understand    Rehab Potential Good    Clinical Decision Making Limited treatment options, no task modification necessary     Comorbidities Affecting Occupational Performance: None    Modification or Assistance to Complete Evaluation  No modification of tasks or assist necessary to complete eval    OT Frequency 2x / week    OT Duration 8 weeks   2x/week for 4 weeks and 1x/week for 4 weeks or 12 visits over 8 weeks.   OT Treatment/Interventions Self-care/ADL training;DME and/or AE instruction;Passive range of motion;Therapeutic activities;Functional Mobility Training;Neuromuscular education;Patient/family education;Visual/perceptual remediation/compensation;Therapeutic exercise;Cognitive remediation/compensation    Plan continue progress towards goals    Consulted and Agree with Plan of Care Patient;Family member/caregiver    Family Member Consulted son, [name]           Patient will benefit from skilled therapeutic intervention in order to improve the following deficits and impairments:   Body Structure / Function / Physical Skills: ADL,Coordination,IADL,GMC,Dexterity,Decreased knowledge of use of DME,FMC,UE functional use,Vision Cognitive Skills: Attention,Safety Awareness,Perception,Understand     Visit Diagnosis: Hemiplegia and hemiparesis following other cerebrovascular disease affecting right non-dominant side ([place])  Visuospatial deficit  Attention and concentration deficit    Problem List Patient Active Problem List   Diagnosis Date Noted  . Normocytic anemia 01/08/2021  . Acute CVA (cerebrovascular accident) ([place]) 01/07/2021  . Pre-diabetes 01/07/2021  . Essential hypertension 01/06/2021  . Hyperlipidemia 01/06/2021  . Hypothyroidism 01/06/2021  . Genetic testing 01/28/2018  . S/P total knee replacement 01/04/2018  . Family history of breast cancer   . 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 Aneurysm of splenic artery ([facility]) 08/23/2012    Carey Bullocks, OTR/L 01/30/2021, 9:32 AM  [facility] [address]  [place], [place], [address] Phone: [phone]   Fax:  [phone]  Name: Crystal Trevino MRN: [id number] Date of Birth: 11/17/1941

## 2021-01-30 NOTE — Patient Instructions (Signed)
Access Code: [email or web address] URL: [email or web address] Date: 01/30/2021 Prepared by: Willow Ora  Exercises Sit to/from Stand in Stride position - 1 x daily - 5 x weekly - 1 sets - 10 reps Standing Balance with Eyes Closed on Foam - 1 x daily - 5 x weekly - 1 sets - 3 reps - 30 hold Wide Stance with Eyes Closed and Head Rotation on Foam Pad - 1 x daily - 5 x weekly - 1 sets - 10 reps

## 2021-01-30 NOTE — Patient Instructions (Signed)
   Practice texting 5 people to work muscle memory  Think about things you did before the stroke that you were independent with (I.e., medicine pill box, feeding animals). With supervision, try to start completing those tasks again over the next few weeks to see how you do

## 2021-02-05 ENCOUNTER — Ambulatory Visit: Payer: Federal, State, Local not specified - PPO | Admitting: Physical Therapy

## 2021-02-05 ENCOUNTER — Encounter: Payer: Self-pay | Admitting: Occupational Therapy

## 2021-02-05 ENCOUNTER — Ambulatory Visit: Payer: Federal, State, Local not specified - PPO | Admitting: Occupational Therapy

## 2021-02-05 ENCOUNTER — Ambulatory Visit: Payer: Federal, State, Local not specified - PPO

## 2021-02-05 ENCOUNTER — Other Ambulatory Visit: Payer: Self-pay

## 2021-02-05 ENCOUNTER — Encounter: Payer: Self-pay | Admitting: Physical Therapy

## 2021-02-05 DIAGNOSIS — R278 Other lack of coordination: Secondary | ICD-10-CM

## 2021-02-05 DIAGNOSIS — R2681 Unsteadiness on feet: Secondary | ICD-10-CM

## 2021-02-05 DIAGNOSIS — I69853 Hemiplegia and hemiparesis following other cerebrovascular disease affecting right non-dominant side: Secondary | ICD-10-CM

## 2021-02-05 DIAGNOSIS — R4701 Aphasia: Secondary | ICD-10-CM

## 2021-02-05 DIAGNOSIS — R29818 Other symptoms and signs involving the nervous system: Secondary | ICD-10-CM

## 2021-02-05 DIAGNOSIS — R41841 Cognitive communication deficit: Secondary | ICD-10-CM

## 2021-02-05 DIAGNOSIS — R41844 Frontal lobe and executive function deficit: Secondary | ICD-10-CM

## 2021-02-05 DIAGNOSIS — R41842 Visuospatial deficit: Secondary | ICD-10-CM

## 2021-02-05 DIAGNOSIS — R41840 Attention and concentration deficit: Secondary | ICD-10-CM

## 2021-02-05 NOTE — Therapy (Signed)
[facility] [address] [place], [place], [address] Phone: [phone]   Fax:  [phone]  Occupational Therapy Treatment  Patient Details  Name: Crystal Trevino MRN: [id number] Date of Birth: 01-11-1941 Referring Provider (OT): Leonides Schanz [place] ([place])   Encounter Date: 02/05/2021   OT End of Session - 02/05/21 0725    Visit Number 4    Number of Visits 13    Date for OT Re-Evaluation 03/21/21    Authorization Type BCBS Federal    Authorization Time Period 75 PT/OT/ST $25 copay    Authorization - Visit Number 4    Authorization - Number of Visits 25    OT Start Time 0722    OT Stop Time 0800    OT Time Calculation (min) 38 min    Activity Tolerance Patient tolerated treatment well    Behavior During Therapy [facility] for tasks assessed/performed;Flat affect           Past Medical History:  Diagnosis Date  . Arthritis   . Breast CA ([place])    right breast cancer  . Cancer ([place])    HAD RIGHT BREAST BX --GOING TO [facility]ER C[facility] ON WED 12/30/2017  . Constipation 08/13/2011  . Dizziness 08/13/2011  . Family history of adverse reaction to anesthesia    pt mother had PONV  . Family history of breast cancer   . Genetic testing 01/28/2018   Multi-Cancer panel (83 genes) @ [facility] - No pathogenic mutations detected  . GERD (gastroesophageal reflux disease)   . Heart murmur    "SLIGHT HEART MURMUR"  WAS PICKED UP AS AN ADULT  . Hyperlipidemia   . Hypertension   . Hypothyroidism   . Osteoarthritis of knee    Right  . Peripheral vascular disease ([place])   . Reflux   . Splenic artery aneurysm (HCC)    stable 1.5cm by 08/2013 CT; no further surveillance recommended  . Weight loss     Past Surgical History:  Procedure Laterality Date  . ABDOMINAL HYSTERECTOMY    . BACK SURGERY    . BREAST LUMPECTOMY WITH RADIOACTIVE SEED LOCALIZATION Right 02/19/2018   Procedure: BREAST LUMPECTOMY WITH RADIOACTIVE SEED LOCALIZATION.  RIGHT;  Surgeon: Alphonsa Overall, MD;  Location: [facility];  Service: General;  Laterality: Right;  . BREAST SURGERY    . CHOLECYSTECTOMY    . KNEE SURGERY    . LOOP RECORDER INSERTION N/A 01/10/2021   Procedure: LOOP RECORDER INSERTION;  Surgeon: Constance Haw, MD;  Location: [facility];  Service: Cardiovascular;  Laterality: N/A;  . LUMBAR LAMINECTOMY    . TOTAL KNEE ARTHROPLASTY Right 01/04/2018  . TOTAL KNEE ARTHROPLASTY Right 01/04/2018   Procedure: TOTAL KNEE ARTHROPLASTY;  Surgeon: Vickey Huger, MD;  Location: [facility];  Service: Orthopedics;  Laterality: Right;  Marland Kitchen VAGINAL PROLAPSE REPAIR     2015   DONE IN [place]  . VAGINAL PROLAPSE REPAIR  05/20/2018    There were no vitals filed for this visit.   Subjective Assessment - 02/05/21 0723    Subjective  Pt denies any pain.    Pertinent History PMHx: R breast cancer s/p lumpectomy and HTN.    Limitations Fall Risk. Loop recorder    Patient Stated Goals Pt reported no deficits.    Currently in Pain? No/denies           Copying small peg design with good accuracy for visual scanning with min cueing/reminders to use R hand to place in  pegs for incr R side awareness/use.    Picking up blocks using gripper (black spring, level 2) for sustained grip strength with mod difficulty/drops with fatigue.    Environmental scanning in mod distracting environment.  Pt found 8/15 items on first pass, additional 4 on 2nd pass, and 2 on 3rd pass.  Needed cueing for remaining item.  Pt with 4/7 missed initially on R side (with cueing for last object on R side).    Tabletop scanning in wider area to match clock faces with digital times.  Pt needed incr time with scanning and difficulty determining time on clock noted and frequently had to check to see if puzzle pieces fit together.  Pt reports this activity was challenging.            OT Short Term Goals - 01/30/21 0930      OT SHORT TERM GOAL #1   Title Pt will be independent with  HEP for grip strengthening RUE 02/21/21    Time 4    Period Weeks    Status On-going    Target Date 02/21/21      OT SHORT TERM GOAL #2   Title Pt will perform environmental scanning with 90% accuracy or greater.    Baseline 80% with all 3 errors on right    Time 4    Period Weeks    Status On-going      OT SHORT TERM GOAL #3   Title Pt will perform warm meal prep and/or home management tasks with mod I    Time 4    Period Weeks    Status New      OT SHORT TERM GOAL #4   Title Pt will improve grip strength in RUE by 5 lbs in order to increase functional use of RUE    Baseline RUE 27.9 lbs, LUE 45.4 lbs    Time 4    Period Weeks    Status On-going             OT Long Term Goals - 01/24/21 1244      OT LONG TERM GOAL #1   Title Pt will be independent with any updated HEP 03/21/21    Time 8    Period Weeks    Status New    Target Date 03/21/21      OT LONG TERM GOAL #2   Title Pt will perform physical and cognitive task simultaneously with 95% accuracy    Time 8    Period Weeks    Status New      OT LONG TERM GOAL #3   Title Pt will return to prior level of functioning for cooking and home management.    Time 8    Period Weeks    Status New      OT LONG TERM GOAL #4   Title Pt will improve grip strength to 35 lbs or greater in RUE    Baseline RUE 27.9 lbs    Time 8    Period Weeks    Status New                 Plan - 02/05/21 6237    Clinical Impression Statement Pt with decreased awareness into deficits and decreased environmental scanning and attention to R side.  Pt is spontaneously scanning to R side, but consistent.    OT Occupational Profile and History Problem Focused Assessment - Including review of records relating to presenting problem    Occupational performance  deficits (Please refer to evaluation for details): IADL's;Leisure;ADL's    Body Structure / Function / Physical Skills ADL;Coordination;IADL;GMC;Dexterity;Decreased knowledge of  use of DME;FMC;UE functional use;Vision    Cognitive Skills Attention;Safety Awareness;Perception;Understand    Rehab Potential Good    Clinical Decision Making Limited treatment options, no task modification necessary    Comorbidities Affecting Occupational Performance: None    Modification or Assistance to Complete Evaluation  No modification of tasks or assist necessary to complete eval    OT Frequency 2x / week    OT Duration 8 weeks   2x/week for 4 weeks and 1x/week for 4 weeks or 12 visits over 8 weeks.   OT Treatment/Interventions Self-care/ADL training;DME and/or AE instruction;Passive range of motion;Therapeutic activities;Functional Mobility Training;Neuromuscular education;Patient/family education;Visual/perceptual remediation/compensation;Therapeutic exercise;Cognitive remediation/compensation    Plan continue progress towards goals    Consulted and Agree with Plan of Care Patient;Family member/caregiver    Family Member Consulted son, [name]           Patient will benefit from skilled therapeutic intervention in order to improve the following deficits and impairments:   Body Structure / Function / Physical Skills: ADL,Coordination,IADL,GMC,Dexterity,Decreased knowledge of use of DME,FMC,UE functional use,Vision Cognitive Skills: Attention,Safety Awareness,Perception,Understand     Visit Diagnosis: Hemiplegia and hemiparesis following other cerebrovascular disease affecting right non-dominant side (HCC)  Visuospatial deficit  Attention and concentration deficit  Other lack of coordination  Frontal lobe and executive function deficit  Unsteadiness on feet  Other symptoms and signs involving the nervous system    Problem List Patient Active Problem List   Diagnosis Date Noted  . Normocytic anemia 01/08/2021  . Acute CVA (cerebrovascular accident) ([place]) 01/07/2021  . Pre-diabetes 01/07/2021  . Essential hypertension 01/06/2021  . Hyperlipidemia 01/06/2021  .  Hypothyroidism 01/06/2021  . Genetic testing 01/28/2018  . S/P total knee replacement 01/04/2018  . Family history of breast cancer   . 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 Aneurysm of splenic artery ([place]) 08/23/2012    [facility] 02/05/2021, 7:58 AM  [facility] [address] [place], [place], [address] Phone: [phone]   Fax:  [phone]  Name: KISMET FACEMIRE MRN: [id number] Date of Birth: 10/07/1941   Vianne Bulls, OTR/L [facility] [address] [place], [place]  [address] [phone] phone [phone] 02/05/21 7:58 AM

## 2021-02-05 NOTE — Therapy (Signed)
[facility] [address] [place], [place], [address] Phone: [phone]   Fax:  [phone]  Speech Language Pathology Treatment-Discharge Summary  Patient Details  Name: Crystal Trevino MRN: [id number] Date of Birth: 03-23-1941 Referring Provider (SLP): Ferne Coe PA ((PCP: Addison Lank, Wendy-documentation))   Encounter Date: 02/05/2021   End of Session - 02/05/21 0842    Visit Number 4    Number of Visits 17    Date for SLP Re-Evaluation 04/23/21    SLP Start Time 0800    SLP Stop Time  0845    SLP Time Calculation (min) 45 min    Activity Tolerance Patient tolerated treatment well           Past Medical History:  Diagnosis Date  . Arthritis   . Breast CA ([place])    right breast cancer  . Cancer ([place])    HAD RIGHT BREAST BX --GOING TO [facility]ER [facility] 12/30/2017  . Constipation 08/13/2011  . Dizziness 08/13/2011  . Family history of adverse reaction to anesthesia    pt mother had PONV  . Family history of breast cancer   . Genetic testing 01/28/2018   Multi-Cancer panel (83 genes) @ [facility] - No pathogenic mutations detected  . GERD (gastroesophageal reflux disease)   . Heart murmur    "SLIGHT HEART MURMUR"  WAS PICKED UP AS AN ADULT  . Hyperlipidemia   . Hypertension   . Hypothyroidism   . Osteoarthritis of knee    Right  . Peripheral vascular disease ([place])   . Reflux   . Splenic artery aneurysm (HCC)    stable 1.5cm by 08/2013 CT; no further surveillance recommended  . Weight loss     Past Surgical History:  Procedure Laterality Date  . ABDOMINAL HYSTERECTOMY    . BACK SURGERY    . BREAST LUMPECTOMY WITH RADIOACTIVE SEED LOCALIZATION Right 02/19/2018   Procedure: BREAST LUMPECTOMY WITH RADIOACTIVE SEED LOCALIZATION. RIGHT;  Surgeon: Alphonsa Overall, MD;  Location: [facility];  Service: General;  Laterality: Right;  . BREAST SURGERY    . CHOLECYSTECTOMY    . KNEE SURGERY    . LOOP RECORDER INSERTION  N/A 01/10/2021   Procedure: LOOP RECORDER INSERTION;  Surgeon: Constance Haw, MD;  Location: [facility];  Service: Cardiovascular;  Laterality: N/A;  . LUMBAR LAMINECTOMY    . TOTAL KNEE ARTHROPLASTY Right 01/04/2018  . TOTAL KNEE ARTHROPLASTY Right 01/04/2018   Procedure: TOTAL KNEE ARTHROPLASTY;  Surgeon: Vickey Huger, MD;  Location: [facility];  Service: Orthopedics;  Laterality: Right;  Marland Kitchen VAGINAL PROLAPSE REPAIR     2015   DONE IN [place]  . VAGINAL PROLAPSE REPAIR  05/20/2018    There were no vitals filed for this visit.   Subjective Assessment - 02/05/21 0802    Subjective "It was great" re: weekend    Currently in Pain? No/denies    Pain Score 0-No pain             SPEECH THERAPY DISCHARGE SUMMARY  Visits from Start of Care: 4  Current functional level related to goals / functional outcomes: Mild cognitive linguistic deficits persist related to executive functioning, attention, and recall. Pt continues to demonstrate decreased error awareness, which may impact overall safety within home environment. SLP educated patient and family on double checking for errors related to medication management and finances. Pt requested discharge from skilled ST services at this time as she endorses return to baseline. Family provided minimal  cognitive concerns in previous sessions.    Remaining deficits: Decreased awareness of deficits, reduced error awareness on structured tasks   Education / Equipment: Constant Therapy, double checking for errors  Plan: Patient agrees to discharge.  Patient goals were partially met. Patient is being discharged due to the patient's request.  ?????          ADULT SLP TREATMENT - 02/05/21 0815      General Information   Behavior/Cognition Alert;Cooperative      Treatment Provided   Treatment provided Cognitive-Linquistic      Cognitive-Linquistic Treatment   Treatment focused on Cognition;Aphasia;Patient/family/caregiver education     Skilled Treatment Pt reports her cognitive linguistic skills have returned to baseline and she would like to discharge from skilled ST intervention at this time. Pt reports she has returned to managing medications without errors, managing finances, and returning to favorite hobbies without difficulty. CLQT readministered this session, with error x1 noted for clock drawing, error x1 on maze, and errors x2 on design generation. SLP cued pt to check for errors, in which pt able to ID ~50% of errors. Poor frustration tolerance noted when SLP assisted pt with identification of errors. Pt continued to request ST discharge, in which SLP completed. SLP educated patient on ability to request referral if cognitive linguistic deficits change or decline.      Assessment / Recommendations / Plan   Plan Discharge SLP treatment due to (comment)   patient request     Progression Toward Goals   Progression toward goals Goals met, education completed, patient discharged from SLP            SLP Education - 02/05/21 0820    Education Details repeat CLQT results, error awareness, request referral as needed    Person(s) Educated Patient    Methods Explanation;Demonstration    Comprehension Verbalized understanding;Returned demonstration            SLP Short Term Goals - 02/05/21 0756      SLP SHORT TERM GOAL #1   Title Pt will verbalize daily schedule, recent events and salient information using external memory aids with min A over 3 sessions    Baseline 01-30-21    Time 3    Period Weeks    Status Partially Met      SLP SHORT TERM GOAL #2   Title Pt will verbalize reasonable/safe solutions to functional safety scenarios at home (transfers, cooking, meds etc) and carry these over with min A from caregivers    Time 3    Period Weeks    Status Partially Met      SLP SHORT TERM GOAL #3   Title Pt will comprehend multi step directions to complete iADL tasks of personal relevance with min A cues over 3  sessions    Time 3    Period Weeks    Status On-going      SLP SHORT TERM GOAL #4   Title Pt will verbalize and demonstrate compensations for error awareness with min A over 3 sessions    Time 3    Period Weeks    Status Partially Met      SLP SHORT TERM GOAL #5   Title Pt will use word finding compensations in 10 minute mod complex conversation with min A over 3 sessions    Time 3    Status Partially Met            SLP Long Term Goals - 02/05/21 7494  SLP LONG TERM GOAL #1   Title Pt will verbalize accurate usage and implemention of external memory aids at home with rare min A from caregiver over 2 sessions    Time 7    Period Weeks    Status Partially Met      SLP LONG TERM GOAL #2   Title Pt will comprehend multi step directions to complete iADL tasks of personal relevance with rare min A cues fronm caregiver between 3 sessions    Time 7    Period Weeks    Status Partially Met      SLP LONG TERM GOAL #3   Title Pt will verbalize and demonstrate compensations for error awareness within home environment with rare min A from caregivers between 3 sessions    Time 7    Period Weeks    Status Partially Met      SLP LONG TERM GOAL #4   Title Pt will use word finding compensations in 15 minute mod complex conversation with rare min A over 3 sessions    Time 7    Period Weeks    Status Partially Met            Plan - 02/05/21 0844    Clinical Impression Statement Linsie "Dot" presents with mild cognitive linguistic deficits and mild aphasia, which appears to be resolving. Pt inquired why she was still coming to speech therapy, in which SLP reviewed rationale for ST services given mild-mod cognitive linguistic deficits on inital evaluation. Pt reports impairments have since resolved and requested discharge from skilled ST. Pt reports she has returned to managing finances independently, feeding pets, and completing quilting projects. Repeat CLQT completed this session,  with min improvements noted since intial evaluation. Pt aware of ~50% of errors when prompted to double check. Despite SLP education re: error awareness and possible situations in every day life, pt requested discharge from skilled ST at this time. Discharge summary completed. No further ST intervention to be completed at this time per patient request. SLP educated that patient may request ST referral if decline or change in cognitive linguistic skills become more noticable.           Patient will benefit from skilled therapeutic intervention in order to improve the following deficits and impairments:   Cognitive communication deficit  Aphasia    Problem List Patient Active Problem List   Diagnosis Date Noted  . Normocytic anemia 01/08/2021  . Acute CVA (cerebrovascular accident) ([place]) 01/07/2021  . Pre-diabetes 01/07/2021  . Essential hypertension 01/06/2021  . Hyperlipidemia 01/06/2021  . Hypothyroidism 01/06/2021  . Genetic testing 01/28/2018  . S/P total knee replacement 01/04/2018  . Family history of breast cancer   . 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 Aneurysm of splenic artery (HCC) 08/23/2012    Alinda Deem, MA CCC-SLP 02/05/2021, 8:47 AM  [facility] [address] [place], [place], [address] Phone: [phone]   Fax:  [phone]   Name: DIAMONE WHISTLER MRN: [id number] Date of Birth: [birth year]

## 2021-02-05 NOTE — Therapy (Signed)
[facility] [address] [place], [place], [address] Phone: [phone]   Fax:  [phone]  Physical Therapy Treatment  Patient Details  Name: Crystal Trevino MRN: [id number] Date of Birth: 10-Apr-1941 Referring Provider (PT): Ferne Coe PA   Encounter Date: 02/05/2021   PT End of Session - 02/05/21 0848    Visit Number 4    Number of Visits 17   frequency is 2x week for 8 weeks, plus eval visit= 17   Date for PT Re-Evaluation 03/28/21    Authorization Type BCBS federal    PT Start Time [phone]    PT Stop Time 0927    PT Time Calculation (min) 41 min    Equipment Utilized During Treatment Gait belt    Activity Tolerance Patient tolerated treatment well    Behavior During Therapy [facility] for tasks assessed/performed;Flat affect           Past Medical History:  Diagnosis Date  . Arthritis   . Breast CA ([place])    right breast cancer  . Cancer ([place])    HAD RIGHT BREAST BX --GOING TO CANCER CENTER ON WED 12/30/2017  . Constipation 08/13/2011  . Dizziness 08/13/2011  . Family history of adverse reaction to anesthesia    pt mother had PONV  . Family history of breast cancer   . Genetic testing 01/28/2018   Multi-Cancer panel (83 genes) @ [facility] - No pathogenic mutations detected  . GERD (gastroesophageal reflux disease)   . Heart murmur    "SLIGHT HEART MURMUR"  WAS PICKED UP AS AN ADULT  . Hyperlipidemia   . Hypertension   . Hypothyroidism   . Osteoarthritis of knee    Right  . Peripheral vascular disease ([place])   . Reflux   . Splenic artery aneurysm (HCC)    stable 1.5cm by 08/2013 CT; no further surveillance recommended  . Weight loss     Past Surgical History:  Procedure Laterality Date  . ABDOMINAL HYSTERECTOMY    . BACK SURGERY    . BREAST LUMPECTOMY WITH RADIOACTIVE SEED LOCALIZATION Right 02/19/2018   Procedure: BREAST LUMPECTOMY WITH RADIOACTIVE SEED LOCALIZATION. RIGHT;  Surgeon: Alphonsa Overall, MD;   Location: [place];  Service: General;  Laterality: Right;  . BREAST SURGERY    . CHOLECYSTECTOMY    . KNEE SURGERY    . LOOP RECORDER INSERTION N/A 01/10/2021   Procedure: LOOP RECORDER INSERTION;  Surgeon: Constance Haw, MD;  Location: [facility];  Service: Cardiovascular;  Laterality: N/A;  . LUMBAR LAMINECTOMY    . TOTAL KNEE ARTHROPLASTY Right 01/04/2018  . TOTAL KNEE ARTHROPLASTY Right 01/04/2018   Procedure: TOTAL KNEE ARTHROPLASTY;  Surgeon: Vickey Huger, MD;  Location: [place];  Service: Orthopedics;  Laterality: Right;  Marland Kitchen VAGINAL PROLAPSE REPAIR     2015   DONE IN [place]  . VAGINAL PROLAPSE REPAIR  05/20/2018    There were no vitals filed for this visit.   Subjective Assessment - 02/05/21 0847    Subjective No new complains. No falls or pain to report    Patient is accompained by: --   friend in lobby   How long can you sit comfortably? n/a    How long can you stand comfortably? n/a    How long can you walk comfortably? unlimited per patient    Currently in Pain? No/denies    Pain Score 0-No pain  [place] Adult PT Treatment/Exercise - 02/05/21 0849      Transfers   Transfers Sit to Stand;Stand to Sit    Sit to Stand 5: Supervision;With upper extremity assist;From bed;From chair/3-in-1    Stand to Sit 5: Supervision;With upper extremity assist;To bed;To chair/3-in-1      Ambulation/Gait   Ambulation/Gait Yes    Ambulation/Gait Assistance 5: Supervision;4: Min guard    Ambulation/Gait Assistance Details no balance issues noted. no assistance  needed.    Ambulation Distance (Feet) 500 Feet   x1 in/outdoors, plus around gym with session   Assistive device None    Gait Pattern Step-through pattern    Ambulation Surface Level;Outdoor;Unlevel;Indoor;Paved      Neuro Re-ed    Neuro Re-ed Details  for balance/muscle re-ed/coordination: gait while tossing ball with supervision, progressing to tossing the ball while naming animals A-Z, min guard  assist with minor veering noted at times, no imbalance. cues to task needed.               Balance Exercises - 02/05/21 0904      Balance Exercises: Standing   Rockerboard Anterior/posterior;Lateral;Head turns;EO;EC;30 seconds;10 reps;Limitations    Rockerboard Limitations performed both ways on balance board with no UE support: roc king the board with emphasis on tall posture with EO, progressing to EC. Then holding the board steady for EC 30 sec's x 3 reps, progressing to EC head movements left<>right, up<>down for ~10 reps each. min guard to min assist with cues for increased anterior weight shifting du eto posterior balance loss preference.    Sit to Stand Standard surface;Without upper extremity support;Foam/compliant surface;Limitations    Sit to Stand Limitations seated at edge of mat with feet across red foam beam: sit<>stand with emphasis on tall posture, controlled sitting for 10 reps. constant cues to technique/task needed due to pt easily distracted with this task.               PT Short Term Goals - 01/31/21 1837      PT SHORT TERM GOAL #1   Title Demo HEP back to PT w/o need of VCs. (all STGs due 02/21/21)    Baseline S required for HPE    Time 4    Period Weeks    Status On-going    Target Date 02/21/21      PT SHORT TERM GOAL #2   Title ambulation of 373ft with LRAD and S across level ground    Baseline in clinic ambulation with close S and SPC    Time 4    Period Weeks    Status On-going    Target Date 02/21/21      PT SHORT TERM GOAL #3   Title assess FGA and establish    Baseline 01/29/21: 25/30 scored this date    Status Achieved    Target Date 02/21/21             PT Long Term Goals - 01/24/21 1218      PT LONG TERM GOAL #1   Title perform 5x STS in 15s or less w/o need of UE support    Baseline 20.5s with UE support    Time 8    Period Weeks    Status New    Target Date 03/21/21      PT LONG TERM GOAL #2   Title Ambulate 1079ft across  unlevel and level ground under distant S with LRAD    Baseline 181ft across level ground with close S  and SPC    Time 8    Period Weeks    Status New    Target Date 03/21/21      PT LONG TERM GOAL #3   Title Perform standard TUG in 13 seconds or less with LRAD    Baseline 16.2 s with SPC    Time 8    Period Weeks    Status New    Target Date 03/21/21                 Plan - 02/05/21 0848    Clinical Impression Statement Today's skilled session continued to address gait and balance with no AD and decreased UE support. No issues noted with gait outdoors wtih no AD. Pt now appears safe to ambulate without her cane (she carried insteady of using it prior to today). No issues noted or reported in session. The pt is progressing well and should benefit from continued PT to progress toward unmet goals.    Personal Factors and Comorbidities Age;Comorbidity 2    Comorbidities CVA    Stability/Clinical Decision Making Stable/Uncomplicated    Rehab Potential Good    PT Frequency 2x / week    PT Duration 8 weeks    PT Treatment/Interventions Aquatic Therapy;DME Instruction;Gait training;Stair training;Functional mobility training;Therapeutic activities;Therapeutic exercise;Balance training;Neuromuscular re-education;Patient/family education;Orthotic Fit/Training    PT Next Visit Plan high level balance and dual/multi-tasking with gait; balance training on compliant surfaces.    PT Home Exercise Plan Access Code: [email or web address]    Consulted and Agree with Plan of Care Patient    Family Member Consulted --           Patient will benefit from skilled therapeutic intervention in order to improve the following deficits and impairments:  Abnormal gait,Decreased balance,Decreased endurance,Difficulty walking,Decreased activity tolerance,Decreased coordination,Decreased safety awareness,Decreased strength  Visit Diagnosis: Hemiplegia and hemiparesis following other cerebrovascular disease affecting  right non-dominant side ([place])  Unsteadiness on feet     Problem List Patient Active Problem List   Diagnosis Date Noted  . Normocytic anemia 01/08/2021  . Acute CVA (cerebrovascular accident) ([place]) 01/07/2021  . Pre-diabetes 01/07/2021  . Essential hypertension 01/06/2021  . Hyperlipidemia 01/06/2021  . Hypothyroidism 01/06/2021  . Genetic testing 01/28/2018  . S/P total knee replacement 01/04/2018  . Family history of breast cancer   . 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 Aneurysm of splenic artery (HCC) 08/23/2012    Willow Ora, PTA, [facility] [address] [place], [place] [address] [phone] 02/06/21, 1:35 PM   Name: Crystal Trevino MRN: [id number] Date of Birth: 07-28-1941

## 2021-02-05 NOTE — Progress Notes (Signed)
Cardiology Office Note Date:  02/07/2021  Patient ID:  Crystal Trevino, Crystal Trevino 1940-12-27, MRN [id number] PCP:  Gwyndolyn Kaufman, PA-C  Electrophysiologist: Dr. Curt Bears    Chief Complaint:  post hospital  History of Present Illness: Crystal Trevino is a 79 y.o. female with history of HTN, HLD, PVD is mentioned in her chart, though she denies any known hx of PVD,  breast cancer (treated surgically and chemo)hypothyroidism, stroke.  She was hospitalized 01/06/21 with slurred speech and sensory neglect, found with left MCA infarcts embolic secondary to unknown source, concerning for embolic EP was consulted to consider loop implant 2D Echo-LVEF 60 to 65%, mild to moderate AVR and mild MVR, dopplers neg for DVT and underwent loop implant.  To date, no AF detected.  TODAY She is accompanied by her daughter She is recovering well, not quite back to her baseline pre-stroke. No CP, palpitations or cardiac awareness. No SOB, no near syncope or syncope.  She is pending neurology and PMD follow up Device information MDT linq II, implanted 01/10/21, cryptogenic stroke   Past Medical History:  Diagnosis Date  . Arthritis   . Breast CA ([place])    right breast cancer  . Cancer ([place])    HAD RIGHT BREAST BX --GOING TO CANCER CENTER ON WED 12/30/2017  . Constipation 08/13/2011  . Dizziness 08/13/2011  . Family history of adverse reaction to anesthesia    pt mother had PONV  . Family history of breast cancer   . Genetic testing 01/28/2018   Multi-Cancer panel (83 genes) @ [facility] - No pathogenic mutations detected  . GERD (gastroesophageal reflux disease)   . Heart murmur    "SLIGHT HEART MURMUR"  WAS PICKED UP AS AN ADULT  . Hyperlipidemia   . Hypertension   . Hypothyroidism   . Osteoarthritis of knee    Right  . Peripheral vascular disease ([place])   . Reflux   . Splenic artery aneurysm (HCC)    stable 1.5cm by 08/2013 CT; no further surveillance recommended  . Weight loss     Past  Surgical History:  Procedure Laterality Date  . ABDOMINAL HYSTERECTOMY    . BACK SURGERY    . BREAST LUMPECTOMY WITH RADIOACTIVE SEED LOCALIZATION Right 02/19/2018   Procedure: BREAST LUMPECTOMY WITH RADIOACTIVE SEED LOCALIZATION. RIGHT;  Surgeon: Alphonsa Overall, MD;  Location: [facility];  Service: General;  Laterality: Right;  . BREAST SURGERY    . CHOLECYSTECTOMY    . KNEE SURGERY    . LOOP RECORDER INSERTION N/A 01/10/2021   Procedure: LOOP RECORDER INSERTION;  Surgeon: Constance Haw, MD;  Location: [facility];  Service: Cardiovascular;  Laterality: N/A;  . LUMBAR LAMINECTOMY    . TOTAL KNEE ARTHROPLASTY Right 01/04/2018  . TOTAL KNEE ARTHROPLASTY Right 01/04/2018   Procedure: TOTAL KNEE ARTHROPLASTY;  Surgeon: Vickey Huger, MD;  Location: [facility];  Service: Orthopedics;  Laterality: Right;  Marland Kitchen VAGINAL PROLAPSE REPAIR     2015   DONE IN [place]  . VAGINAL PROLAPSE REPAIR  05/20/2018    Current Outpatient Medications  Medication Sig Dispense Refill  . carvedilol (COREG) 12.5 MG tablet Take 12.5 mg by mouth 2 (two) times daily with a meal.    . clopidogrel (PLAVIX) 75 MG tablet Take 75 mg by mouth daily.    . fluticasone (FLONASE) 50 MCG/ACT nasal spray Place 1 spray into both nostrils daily as needed for allergies or rhinitis.    Marland Kitchen gabapentin (NEURONTIN) 300 MG capsule Take  1 capsule (300 mg total) by mouth at bedtime. 90 capsule 4  . levothyroxine (SYNTHROID, LEVOTHROID) 75 MCG tablet Take 75 mcg by mouth daily before breakfast.    . Multiple Vitamins-Minerals (CENTRUM SILVER) tablet Take 1 tablet by mouth daily.    Marland Kitchen senna-docusate (SENOKOT-S) 8.6-50 MG tablet Take 1 tablet by mouth at bedtime as needed for mild constipation. 30 tablet 0  . simvastatin (ZOCOR) 20 MG tablet Take 20 mg by mouth at bedtime.     No current facility-administered medications for this visit.    Allergies:   Penicillins   Social History:  The patient  reports that she has never smoked. She has  never used smokeless tobacco. She reports that she does not drink alcohol and does not use drugs.   Family History:  The patient's family history includes Breast cancer in her daughter; Breast cancer (age of onset: 2) in her maternal aunt; Breast cancer (age of onset: 30) in her mother; Heart disease in her father and mother.  ROS:  Please see the history of present illness.    All other systems are reviewed and otherwise negative.   PHYSICAL EXAM:  VS:  BP 120/62   Pulse 89   Ht 5\' 6"  (1.676 m)   Wt 160 lb (72.6 kg)   SpO2 99%   BMI 25.82 kg/m  BMI: Body mass index is 25.82 kg/m. Well nourished, well developed, in no acute distress HEENT: normocephalic, atraumatic Neck: no JVD, carotid bruits or masses Cardiac:  RRR; no significant murmurs, no rubs, or gallops Lungs:  CTA b/l, no wheezing, rhonchi or rales Abd: soft, nontender MS: no deformity, age appropriate atrophy Ext: no edema Skin: warm and dry, no rash Neuro:  No gross deficits appreciated Psych: euthymic mood, full affect  ILR site is well healed, stable, no tethering or discomfort   EKG:  Not done today  Device interrogation done today and reviewed by myself:  Battery is good R waves 0.73mV No device observations No arrhythmias   01/07/21: TTE IMPRESSIONS  1. Left ventricular ejection fraction, by estimation, is 60 to 65%. The  left ventricle has normal function. The left ventricle has no regional  wall motion abnormalities. There is mild left ventricular hypertrophy.  Left ventricular diastolic parameters  are consistent with Grade II diastolic dysfunction (pseudonormalization).  2. Right ventricular systolic function is normal. The right ventricular  size is normal. There is normal pulmonary artery systolic pressure. The  estimated right ventricular systolic pressure is 52.7 mmHg.  3. The mitral valve is normal in structure. Mild mitral valve  regurgitation. No evidence of mitral stenosis.  4. The  aortic valve is tricuspid. Aortic valve regurgitation is mild to  moderate. Mild aortic valve sclerosis is present, with no evidence of  aortic valve stenosis.  5. The inferior vena cava is normal in size with greater than 50%  respiratory variability, suggesting right atrial pressure of 3 mmHg.   Recent Labs: 01/06/2021: ALT 11 01/07/2021: Hemoglobin 10.2; Platelets 195; TSH 3.265 01/08/2021: BUN 12; Creatinine, Ser 0.80; Potassium 4.0; Sodium 138  01/07/2021: Cholesterol 154; HDL 58; LDL Cholesterol 68; Total CHOL/HDL Ratio 2.7; Triglycerides 139; VLDL 28   CrCl cannot be calculated (Patient's most recent lab result is older than the maximum 21 days allowed.).   Wt Readings from Last 3 Encounters:  02/07/21 160 lb (72.6 kg)  01/06/21 173 lb 11.6 oz (78.8 kg)  03/13/20 163 lb 11.2 oz (74.3 kg)     Other studies reviewed:  Additional studies/records reviewed today include: summarized above  ASSESSMENT AND PLAN:  1. Cryptogenic stroke 2. Loop implant     We revisited the rational for long term monitoring/afib surveillance     Discussed monthly reports and PRN EP visits  3. HTN     Looks good  Disposition: F/u with monthly loop transmissions, in clinic with EP PRN  Current medicines are reviewed at length with the patient today.  The patient did not have any concerns regarding medicines.  Venetia Night, PA-C 02/07/2021 12:49 PM     [place] [address] [phone] (office)  [phone] (fax)

## 2021-02-07 ENCOUNTER — Encounter: Payer: Self-pay | Admitting: Physician Assistant

## 2021-02-07 ENCOUNTER — Ambulatory Visit: Payer: Federal, State, Local not specified - PPO | Admitting: Physician Assistant

## 2021-02-07 ENCOUNTER — Other Ambulatory Visit: Payer: Self-pay

## 2021-02-07 VITALS — BP 120/62 | HR 89 | Ht 66.0 in | Wt 160.0 lb

## 2021-02-07 DIAGNOSIS — I10 Essential (primary) hypertension: Secondary | ICD-10-CM | POA: Diagnosis not present

## 2021-02-07 DIAGNOSIS — I639 Cerebral infarction, unspecified: Secondary | ICD-10-CM

## 2021-02-07 DIAGNOSIS — Z4509 Encounter for adjustment and management of other cardiac device: Secondary | ICD-10-CM | POA: Diagnosis not present

## 2021-02-07 NOTE — Patient Instructions (Signed)
Medication Instructions:   Your physician recommends that you continue on your current medications as directed. Please refer to the Current Medication list given to you today.   *If you need a refill on your cardiac medications before your next appointment, please call your pharmacy*   Lab Work: [place]    If you have labs (blood work) drawn today and your tests are completely normal, you will receive your results only by: Marland Kitchen MyChart Message (if you have MyChart) OR . A paper copy in the mail If you have any lab test that is abnormal or we need to change your treatment, we will call you to review the results.   Testing/Procedures: NONE ORDERED  TODAY   Follow-Up: At [facility], you and your health needs are our priority.  As part of our continuing mission to provide you with exceptional heart care, we have created designated Provider Care Teams.  These Care Teams include your primary Cardiologist (physician) and Advanced Practice Providers (APPs -  Physician Assistants and Nurse Practitioners) who all work together to provide you with the care you need, when you need it.  We recommend signing up for the patient portal called "MyChart".  Sign up information is provided on this After Visit Summary.  MyChart is used to connect with patients for Virtual Visits (Telemedicine).  Patients are able to view lab/test results, encounter notes, upcoming appointments, etc.  Non-urgent messages can be sent to your provider as well.   To learn more about what you can do with MyChart, go to [email or web address].     CONTACT [facility] [phone] AS NEEDED FOR  ANY CARDIAC RELATED SYMPTOMS Other Instructions

## 2021-02-08 ENCOUNTER — Ambulatory Visit: Payer: Federal, State, Local not specified - PPO

## 2021-02-08 ENCOUNTER — Ambulatory Visit: Payer: Federal, State, Local not specified - PPO | Admitting: Occupational Therapy

## 2021-02-08 ENCOUNTER — Encounter: Payer: Self-pay | Admitting: Occupational Therapy

## 2021-02-08 DIAGNOSIS — I69853 Hemiplegia and hemiparesis following other cerebrovascular disease affecting right non-dominant side: Secondary | ICD-10-CM

## 2021-02-08 DIAGNOSIS — R41840 Attention and concentration deficit: Secondary | ICD-10-CM

## 2021-02-08 DIAGNOSIS — R2681 Unsteadiness on feet: Secondary | ICD-10-CM | POA: Diagnosis not present

## 2021-02-08 DIAGNOSIS — R41844 Frontal lobe and executive function deficit: Secondary | ICD-10-CM

## 2021-02-08 DIAGNOSIS — R41842 Visuospatial deficit: Secondary | ICD-10-CM

## 2021-02-08 DIAGNOSIS — R278 Other lack of coordination: Secondary | ICD-10-CM

## 2021-02-08 NOTE — Therapy (Signed)
[facility] [address] [place], [place], [address] Phone: [phone]   Fax:  [phone]  Occupational Therapy Treatment  Patient Details  Name: Crystal Trevino MRN: [id number] Date of Birth: Mar 13, 1941 Referring Provider (OT): Leonides Schanz [place] ([place])   Encounter Date: 02/08/2021   OT End of Session - 02/08/21 1313    Visit Number 5    Number of Visits 13    Date for OT Re-Evaluation 03/21/21    Authorization Type BCBS Federal    Authorization Time Period 75 PT/OT/ST $25 copay    Authorization - Visit Number 5    Authorization - Number of Visits 25    OT Start Time 9485    OT Stop Time 1353    OT Time Calculation (min) 40 min    Activity Tolerance Patient tolerated treatment well    Behavior During Therapy [facility] for tasks assessed/performed;Flat affect           Past Medical History:  Diagnosis Date  . Arthritis   . Breast CA ([place])    right breast cancer  . Cancer ([place])    HAD RIGHT BREAST BX --GOING [facility] ON [facility]ED 12/30/2017  . Constipation 08/13/2011  . Dizziness 08/13/2011  . Family history of adverse reaction to anesthesia    pt mother had PONV  . Family history of breast cancer   . Genetic testing 01/28/2018   Multi-Cancer panel (83 genes) @ [facility] - No pathogenic mutations detected  . GERD (gastroesophageal reflux disease)   . Heart murmur    "SLIGHT HEART MURMUR"  WAS PICKED UP AS AN ADULT  . Hyperlipidemia   . Hypertension   . Hypothyroidism   . Osteoarthritis of knee    Right  . Peripheral vascular disease ([place])   . Reflux   . Splenic artery aneurysm (HCC)    stable 1.5cm by 08/2013 CT; no further surveillance recommended  . Weight loss     Past Surgical History:  Procedure Laterality Date  . ABDOMINAL HYSTERECTOMY    . BACK SURGERY    . BREAST LUMPECTOMY WITH RADIOACTIVE SEED LOCALIZATION Right 02/19/2018   Procedure: BREAST LUMPECTOMY WITH RADIOACTIVE SEED LOCALIZATION.  RIGHT;  Surgeon: Alphonsa Overall, MD;  Location: [facility];  Service: General;  Laterality: Right;  . BREAST SURGERY    . CHOLECYSTECTOMY    . KNEE SURGERY    . LOOP RECORDER INSERTION N/A 01/10/2021   Procedure: LOOP RECORDER INSERTION;  Surgeon: Constance Haw, MD;  Location: [facility];  Service: Cardiovascular;  Laterality: N/A;  . LUMBAR LAMINECTOMY    . TOTAL KNEE ARTHROPLASTY Right 01/04/2018  . TOTAL KNEE ARTHROPLASTY Right 01/04/2018   Procedure: TOTAL KNEE ARTHROPLASTY;  Surgeon: Vickey Huger, MD;  Location: [facility];  Service: Orthopedics;  Laterality: Right;  Marland Kitchen VAGINAL PROLAPSE REPAIR     2015   DONE IN [place]  . VAGINAL PROLAPSE REPAIR  05/20/2018    There were no vitals filed for this visit.   Subjective Assessment - 02/08/21 1347    Subjective  Pt denies any pain. Pt was asking about where to purchase clock activity.    Pertinent History PMHx: R breast cancer s/p lumpectomy and HTN.    Limitations Fall Risk. Loop recorder    Patient Stated Goals Pt reported no deficits.    Currently in Pain? No/denies  OT Treatments/Exercises (OP) - 02/08/21 1330      Visual/Perceptual Exercises   Scanning Tabletop;Environmental    Scanning - Environmental 92% accuracy today in max distracting environment. Pt found 11/12 clothespins and required min verbal cue sfor finding the 1 on the second pass.    Scanning - Tabletop Same symbol level 1 with 90% accuracy on CT and Alternating Symbols level 1 on Constant Therapy with 96% accuracy and 28.87s response time. Crossing out numbers in order with right inattention noted on first attempt. Pt was able to consecutively cross out numbers but only scanned to right when couldn't find the number on the left. After cueing patient, patient spontaneously would scan to right for the second puzzle.    Visual Motor Integration 36 pc puzzle (bicycle) with min verbal cues and attending to details and orientation of  pieces.                    OT Short Term Goals - 02/08/21 1344      OT SHORT TERM GOAL #1   Title Pt will be independent with HEP for grip strengthening RUE 02/21/21    Time 4    Period Weeks    Status On-going    Target Date 02/21/21      OT SHORT TERM GOAL #2   Title Pt will perform environmental scanning with 90% accuracy or greater.    Baseline 80% with all 3 errors on right    Time 4    Period Weeks    Status Achieved   92%     OT SHORT TERM GOAL #3   Title Pt will perform warm meal prep and/or home management tasks with mod I    Time 4    Period Weeks    Status New      OT SHORT TERM GOAL #4   Title Pt will improve grip strength in RUE by 5 lbs in order to increase functional use of RUE    Baseline RUE 27.9 lbs, LUE 45.4 lbs    Time 4    Period Weeks    Status On-going             OT Long Term Goals - 01/24/21 1244      OT LONG TERM GOAL #1   Title Pt will be independent with any updated HEP 03/21/21    Time 8    Period Weeks    Status New    Target Date 03/21/21      OT LONG TERM GOAL #2   Title Pt will perform physical and cognitive task simultaneously with 95% accuracy    Time 8    Period Weeks    Status New      OT LONG TERM GOAL #3   Title Pt will return to prior level of functioning for cooking and home management.    Time 8    Period Weeks    Status New      OT LONG TERM GOAL #4   Title Pt will improve grip strength to 35 lbs or greater in RUE    Baseline RUE 27.9 lbs    Time 8    Period Weeks    Status New                 Plan - 02/08/21 1333    Clinical Impression Statement Pt presents with recall of what she did last session asking where she could get the clock activity to practice  at home. Pt with increaesd attention to tasks this day but continues to have some deficits with attention to detail and resistant to accepting of cues.    OT Occupational Profile and History Problem Focused Assessment - Including review of  records relating to presenting problem    Occupational performance deficits (Please refer to evaluation for details): IADL's;Leisure;ADL's    Body Structure / Function / Physical Skills ADL;Coordination;IADL;GMC;Dexterity;Decreased knowledge of use of DME;FMC;UE functional use;Vision    Cognitive Skills Attention;Safety Awareness;Perception;Understand    Rehab Potential Good    Clinical Decision Making Limited treatment options, no task modification necessary    Comorbidities Affecting Occupational Performance: None    Modification or Assistance to Complete Evaluation  No modification of tasks or assist necessary to complete eval    OT Frequency 2x / week    OT Duration 8 weeks   2x/week for 4 weeks and 1x/week for 4 weeks or 12 visits over 8 weeks.   OT Treatment/Interventions Self-care/ADL training;DME and/or AE instruction;Passive range of motion;Therapeutic activities;Functional Mobility Training;Neuromuscular education;Patient/family education;Visual/perceptual remediation/compensation;Therapeutic exercise;Cognitive remediation/compensation    Plan continue progress towards goals    Consulted and Agree with Plan of Care Patient;Family member/caregiver    Family Member Consulted son, [name]           Patient will benefit from skilled therapeutic intervention in order to improve the following deficits and impairments:   Body Structure / Function / Physical Skills: ADL,Coordination,IADL,GMC,Dexterity,Decreased knowledge of use of DME,FMC,UE functional use,Vision Cognitive Skills: Attention,Safety Awareness,Perception,Understand     Visit Diagnosis: Hemiplegia and hemiparesis following other cerebrovascular disease affecting right non-dominant side (HCC)  Visuospatial deficit  Attention and concentration deficit  Other lack of coordination  Frontal lobe and executive function deficit  Unsteadiness on feet    Problem List Patient Active Problem List   Diagnosis Date Noted  .  Normocytic anemia 01/08/2021  . Acute CVA (cerebrovascular accident) ([place]) 01/07/2021  . Pre-diabetes 01/07/2021  . Essential hypertension 01/06/2021  . Hyperlipidemia 01/06/2021  . Hypothyroidism 01/06/2021  . Genetic testing 01/28/2018  . S/P total knee replacement 01/04/2018  . Family history of breast cancer   . 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 Aneurysm of splenic artery ([place]) 08/23/2012    Zachery Conch MOT, OTR/L  02/08/2021, 1:58 PM  [facility] [address] [place], [place], [address] Phone: [phone]   Fax:  [phone]  Name: MCKINZY FULLER MRN: [id number] Date of Birth: [birth year]

## 2021-02-09 NOTE — Therapy (Signed)
[facility] [address] [place], [place], [address] Phone: [phone]   Fax:  [phone]  Physical Therapy Treatment  Patient Details  Name: Crystal Trevino MRN: [id number] Date of Birth: 04/10/1941 Referring Provider (PT): Ferne Coe PA   Encounter Date: 02/08/2021   PT End of Session - 02/08/21 1614    Visit Number 5    Number of Visits 17   frequency is 2x week for 8 weeks, plus eval visit= 17   Date for PT Re-Evaluation 03/28/21    Authorization Type BCBS federal    PT Start Time 1230    PT Stop Time 1315    PT Time Calculation (min) 45 min    Equipment Utilized During Treatment Gait belt    Activity Tolerance Patient tolerated treatment well    Behavior During Therapy WFL for tasks assessed/performed;Flat affect           Past Medical History:  Diagnosis Date  . Arthritis   . Breast CA ([place])    right breast cancer  . Cancer ([place])    HAD RIGHT BREAST BX --[facility]ING [facility] 12/30/2017  . Constipation 08/13/2011  . Dizziness 08/13/2011  . Family history of adverse reaction to anesthesia    pt mother had PONV  . Family history of breast cancer   . Genetic testing 01/28/2018   Multi-Cancer panel (83 genes) @ [facility] - No pathogenic mutations detected  . GERD (gastroesophageal reflux disease)   . Heart murmur    "SLIGHT HEART MURMUR"  WAS PICKED UP AS AN ADULT  . Hyperlipidemia   . Hypertension   . Hypothyroidism   . Osteoarthritis of knee    Right  . Peripheral vascular disease ([place])   . Reflux   . Splenic artery aneurysm (HCC)    stable 1.5cm by 08/2013 CT; no further surveillance recommended  . Weight loss     Past Surgical History:  Procedure Laterality Date  . ABDOMINAL HYSTERECTOMY    . BACK SURGERY    . BREAST LUMPECTOMY WITH RADIOACTIVE SEED LOCALIZATION Right 02/19/2018   Procedure: BREAST LUMPECTOMY WITH RADIOACTIVE SEED LOCALIZATION. RIGHT;  Surgeon: Alphonsa Overall, MD;   Location: [facility];  Service: General;  Laterality: Right;  . BREAST SURGERY    . CHOLECYSTECTOMY    . KNEE SURGERY    . LOOP RECORDER INSERTION N/A 01/10/2021   Procedure: LOOP RECORDER INSERTION;  Surgeon: Constance Haw, MD;  Location: [facility];  Service: Cardiovascular;  Laterality: N/A;  . LUMBAR LAMINECTOMY    . TOTAL KNEE ARTHROPLASTY Right 01/04/2018  . TOTAL KNEE ARTHROPLASTY Right 01/04/2018   Procedure: TOTAL KNEE ARTHROPLASTY;  Surgeon: Vickey Huger, MD;  Location: [facility];  Service: Orthopedics;  Laterality: Right;  Marland Kitchen VAGINAL PROLAPSE REPAIR     2015   DONE IN [place]  . VAGINAL PROLAPSE REPAIR  05/20/2018    There were no vitals filed for this visit.   Subjective Assessment - 02/08/21 1239    Subjective no pain or falls to report, has DCed use of cane for the most part    Patient is accompained by: --   friend in lobby   How long can you sit comfortably? n/a    How long can you stand comfortably? n/a    How long can you walk comfortably? unlimited per patient                    02/08/21 0001  Ambulation/Gait  Ambulation/Gait Yes  Ambulation/Gait Assistance 4: Min guard  Ambulation/Gait Assistance Details transfered ball from hand to hand  Ambulation Distance (Feet) 460 Feet  Assistive device None  Gait Pattern Step-through pattern  Ambulation Surface Level;Indoor  Knee/Hip Exercises: Aerobic  Nustep L1 arms 10      02/08/21 0001  Balance Exercises: Standing  Rockerboard Anterior/posterior;Head turns;EO;EC;30 seconds;10 reps;Limitations  Rockerboard Limitations 30s hold with EC/EO followed by head turns/nods with EC/EO  Tandem Gait Forward;Intermittent upper extremity support;Foam/compliant surface;5 reps  Tandem Gait Limitations across blue mat 5 trips  Retro Gait Foam/compliant surface;5 reps;Limitations  Retro Gait Limitations across blue mat in // bars 5 trips with intermittent UE support  Sidestepping Foam/compliant support;5  reps;Limitations  Sidestepping Limitations across blue mat                       PT Short Term Goals - 01/31/21 1837      PT SHORT TERM GOAL #1   Title Demo HEP back to PT w/o need of VCs. (all STGs due 02/21/21)    Baseline S required for HPE    Time 4    Period Weeks    Status On-going    Target Date 02/21/21      PT SHORT TERM GOAL #2   Title ambulation of 33ft with LRAD and S across level ground    Baseline in clinic ambulation with close S and SPC    Time 4    Period Weeks    Status On-going    Target Date 02/21/21      PT SHORT TERM GOAL #3   Title assess FGA and establish    Baseline 01/29/21: 25/30 scored this date    Status Achieved    Target Date 02/21/21             PT Long Term Goals - 01/24/21 1218      PT LONG TERM GOAL #1   Title perform 5x STS in 15s or less w/o need of UE support    Baseline 20.5s with UE support    Time 8    Period Weeks    Status New    Target Date 03/21/21      PT LONG TERM GOAL #2   Title Ambulate 1037ft across unlevel and level ground under distant S with LRAD    Baseline 156ft across level ground with close S and SPC    Time 8    Period Weeks    Status New    Target Date 03/21/21      PT LONG TERM GOAL #3   Title Perform standard TUG in 13 seconds or less with LRAD    Baseline 16.2 s with SPC    Time 8    Period Weeks    Status New    Target Date 03/21/21                 Plan - 02/09/21 1615    Clinical Impression Statement todays skilled session focused on ambulation and balance training with emphasis on dual tasking and diversions, continues to drift to R with some denial of deficits noted, flat affect during session    Personal Factors and Comorbidities Age;Comorbidity 2    Comorbidities CVA    Stability/Clinical Decision Making Stable/Uncomplicated    Rehab Potential Good    PT Frequency 2x / week    PT Duration 8 weeks    PT Treatment/Interventions Aquatic Therapy;DME  Instruction;Gait training;Stair  training;Functional mobility training;Therapeutic activities;Therapeutic exercise;Balance training;Neuromuscular re-education;Patient/family education;Orthotic Fit/Training    PT Next Visit Plan high level balance and dual/multi-tasking with gait; balance training on compliant surfaces, outside ambulation and ramp training    PT Home Exercise Plan Access Code: [email or web address]    Consulted and Agree with Plan of Care Patient           Patient will benefit from skilled therapeutic intervention in order to improve the following deficits and impairments:  Abnormal gait,Decreased balance,Decreased endurance,Difficulty walking,Decreased activity tolerance,Decreased coordination,Decreased safety awareness,Decreased strength  Visit Diagnosis: Unsteadiness on feet  Hemiplegia and hemiparesis following other cerebrovascular disease affecting right non-dominant side [facility])     Problem List Patient Active Problem List   Diagnosis Date Noted  . Normocytic anemia 01/08/2021  . Acute CVA (cerebrovascular accident) ([place]) 01/07/2021  . Pre-diabetes 01/07/2021  . Essential hypertension 01/06/2021  . Hyperlipidemia 01/06/2021  . Hypothyroidism 01/06/2021  . Genetic testing 01/28/2018  . S/P total knee replacement 01/04/2018  . Family history of breast cancer   . 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 Aneurysm of splenic artery ([place]) 08/23/2012    Lanice Shirts 02/09/2021, 4:25 PM  [facility] [address] [place], [place], [address] Phone: [phone]   Fax:  [phone]  Name: Crystal Trevino MRN: [id number] Date of Birth: [birth year]

## 2021-02-12 ENCOUNTER — Ambulatory Visit: Payer: Federal, State, Local not specified - PPO

## 2021-02-12 ENCOUNTER — Ambulatory Visit: Payer: Federal, State, Local not specified - PPO | Admitting: Occupational Therapy

## 2021-02-12 ENCOUNTER — Other Ambulatory Visit: Payer: Self-pay

## 2021-02-12 DIAGNOSIS — R278 Other lack of coordination: Secondary | ICD-10-CM

## 2021-02-12 DIAGNOSIS — R2681 Unsteadiness on feet: Secondary | ICD-10-CM

## 2021-02-12 DIAGNOSIS — I69853 Hemiplegia and hemiparesis following other cerebrovascular disease affecting right non-dominant side: Secondary | ICD-10-CM

## 2021-02-12 DIAGNOSIS — R41842 Visuospatial deficit: Secondary | ICD-10-CM

## 2021-02-12 DIAGNOSIS — R41840 Attention and concentration deficit: Secondary | ICD-10-CM

## 2021-02-12 DIAGNOSIS — R41844 Frontal lobe and executive function deficit: Secondary | ICD-10-CM

## 2021-02-12 NOTE — Therapy (Signed)
[facility] [address] [place], [place], [address] Phone: [phone]   Fax:  [phone]  Occupational Therapy Treatment  Patient Details  Name: Crystal Trevino MRN: [id number] Date of Birth: [birth year] Referring Provider (OT): Leonides Schanz [place] ([place])   Encounter Date: 02/12/2021   OT End of Session - 02/12/21 1323    Visit Number 6    Number of Visits 13    Date for OT Re-Evaluation 03/21/21    Authorization Type BCBS Federal    Authorization Time Period 75 PT/OT/ST $25 copay    Authorization - Visit Number 6    Authorization - Number of Visits 25    OT Start Time 1320    OT Stop Time 1400    OT Time Calculation (min) 40 min    Activity Tolerance Patient tolerated treatment well    Behavior During Therapy [facility] for tasks assessed/performed;Flat affect           Past Medical History:  Diagnosis Date  . Arthritis   . Breast CA ([place])    right breast cancer  . Cancer ([place])    HAD RIGHT BREAST BX --GOING [facility]ED 12/30/2017  . Constipation 08/13/2011  . Dizziness 08/13/2011  . Family history of adverse reaction to anesthesia    pt mother had PONV  . Family history of breast cancer   . Genetic testing 01/28/2018   Multi-Cancer panel (83 genes) @ [facility] - No pathogenic mutations detected  . GERD (gastroesophageal reflux disease)   . Heart murmur    "SLIGHT HEART MURMUR"  WAS PICKED UP AS AN ADULT  . Hyperlipidemia   . Hypertension   . Hypothyroidism   . Osteoarthritis of knee    Right  . Peripheral vascular disease ([place])   . Reflux   . Splenic artery aneurysm ([facility])    stable 1.5cm by 08/2013 CT; no further surveillance recommended  . Weight loss     Past Surgical History:  Procedure Laterality Date  . ABDOMINAL HYSTERECTOMY    . BACK SURGERY    . BREAST LUMPECTOMY WITH RADIOACTIVE SEED LOCALIZATION Right 02/19/2018   Procedure: BREAST LUMPECTOMY WITH RADIOACTIVE SEED LOCALIZATION.  RIGHT;  Surgeon: Alphonsa Overall, MD;  Location: [place] [place];  Service: General;  Laterality: Right;  . BREAST SURGERY    . CHOLECYSTECTOMY    . KNEE SURGERY    . LOOP RECORDER INSERTION N/A 01/10/2021   Procedure: LOOP RECORDER INSERTION;  Surgeon: Constance Haw, MD;  Location: [place] [facility];  Service: Cardiovascular;  Laterality: N/A;  . LUMBAR LAMINECTOMY    . TOTAL KNEE ARTHROPLASTY Right 01/04/2018  . TOTAL KNEE ARTHROPLASTY Right 01/04/2018   Procedure: TOTAL KNEE ARTHROPLASTY;  Surgeon: Vickey Huger, MD;  Location: [facility];  Service: Orthopedics;  Laterality: Right;  Marland Kitchen VAGINAL PROLAPSE REPAIR     2015   DONE IN [place]  . VAGINAL PROLAPSE REPAIR  05/20/2018    There were no vitals filed for this visit.   Subjective Assessment - 02/12/21 1320    Subjective  I think that I'm doing good.  Pt reports making omlet (chopping and cooking), making chicken salad by cooking in crockpot (cutting celery and pickles), and cleaning bathroom (sink, toilet, and litter box).    Pertinent History PMHx: R breast cancer s/p lumpectomy and HTN.    Limitations Fall Risk. Loop recorder    Patient Stated Goals Pt reported no deficits.    Currently in Pain? No/denies  Copying PVC designs x2:   min cueing/difficulty with omission of 3 pieces when design was not linear for first design (likely due to impulsivity--pt demo decr awareness of error).  Pt with 100% accuracy for 2nd design without cueing.  Picking up blocks using gripper (silver spring, level 2) for sustained grip strength with mod difficulty/drops with fatigue.    Constant Therapy Symbol Match level 4 with 95% accurarcy and 15.83sec average response time (missed item when pt went too fast and missed direction changes).  Alternating Symbol Match level 6 with 91 accurarcy and 87sec average response time.  Pt with decr accuracy with impulsivity noted and appeared frustrated.  Pt missed more items on R side with impulsivity.    Pt  questions how much longer than OT will continue and what she needed to work on.  Discussed improvements and continued difficulty with attention, impulsivity, and decr scanning to R side.  Discussed how these difficulties would impact safety for driving.   Continued to recommend board/card games, hidden pictures, puzzles, word searches at home.  Pt verbalized understanding, but was appeared frustrated at end of session.  Pt reports that PT may d/c soon.        OT Short Term Goals - 02/08/21 1344      OT SHORT TERM GOAL #1   Title Pt will be independent with HEP for grip strengthening RUE 02/21/21    Time 4    Period Weeks    Status On-going    Target Date 02/21/21      OT SHORT TERM GOAL #2   Title Pt will perform environmental scanning with 90% accuracy or greater.    Baseline 80% with all 3 errors on right    Time 4    Period Weeks    Status Achieved   92%     OT SHORT TERM GOAL #3   Title Pt will perform warm meal prep and/or home management tasks with mod I    Time 4    Period Weeks    Status New      OT SHORT TERM GOAL #4   Title Pt will improve grip strength in RUE by 5 lbs in order to increase functional use of RUE    Baseline RUE 27.9 lbs, LUE 45.4 lbs    Time 4    Period Weeks    Status On-going             OT Long Term Goals - 01/24/21 1244      OT LONG TERM GOAL #1   Title Pt will be independent with any updated HEP 03/21/21    Time 8    Period Weeks    Status New    Target Date 03/21/21      OT LONG TERM GOAL #2   Title Pt will perform physical and cognitive task simultaneously with 95% accuracy    Time 8    Period Weeks    Status New      OT LONG TERM GOAL #3   Title Pt will return to prior level of functioning for cooking and home management.    Time 8    Period Weeks    Status New      OT LONG TERM GOAL #4   Title Pt will improve grip strength to 35 lbs or greater in RUE    Baseline RUE 27.9 lbs    Time 8    Period Weeks    Status New  Plan - 02/12/21 1323    Clinical Impression Statement Pt is progressing towards goals with improving attention and problem solving and scanning to R side.    OT Occupational Profile and History Problem Focused Assessment - Including review of records relating to presenting problem    Occupational performance deficits (Please refer to evaluation for details): IADL's;Leisure;ADL's    Body Structure / Function / Physical Skills ADL;Coordination;IADL;GMC;Dexterity;Decreased knowledge of use of DME;FMC;UE functional use;Vision    Cognitive Skills Attention;Safety Awareness;Perception;Understand    Rehab Potential Good    Clinical Decision Making Limited treatment options, no task modification necessary    Comorbidities Affecting Occupational Performance: None    Modification or Assistance to Complete Evaluation  No modification of tasks or assist necessary to complete eval    OT Frequency 2x / week    OT Duration 8 weeks   2x/week for 4 weeks and 1x/week for 4 weeks or 12 visits over 8 weeks.   OT Treatment/Interventions Self-care/ADL training;DME and/or AE instruction;Passive range of motion;Therapeutic activities;Functional Mobility Training;Neuromuscular education;Patient/family education;Visual/perceptual remediation/compensation;Therapeutic exercise;Cognitive remediation/compensation    Plan divided attention with environmental scanning, ?cooking task, begin checking STGs    Consulted and Agree with Plan of Care Patient;Family member/caregiver    Family Member Consulted son, [name]           Patient will benefit from skilled therapeutic intervention in order to improve the following deficits and impairments:   Body Structure / Function / Physical Skills: ADL,Coordination,IADL,GMC,Dexterity,Decreased knowledge of use of DME,FMC,UE functional use,Vision Cognitive Skills: Attention,Safety Awareness,Perception,Understand     Visit Diagnosis: Visuospatial  deficit  Attention and concentration deficit  Other lack of coordination  Frontal lobe and executive function deficit  Hemiplegia and hemiparesis following other cerebrovascular disease affecting right non-dominant side [facility])    Problem List Patient Active Problem List   Diagnosis Date Noted  . Normocytic anemia 01/08/2021  . Acute CVA (cerebrovascular accident) ([place]) 01/07/2021  . Pre-diabetes 01/07/2021  . Essential hypertension 01/06/2021  . Hyperlipidemia 01/06/2021  . Hypothyroidism 01/06/2021  . Genetic testing 01/28/2018  . S/P total knee replacement 01/04/2018  . Family history of breast cancer   . 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 Aneurysm of splenic artery ([place]) 08/23/2012    [facility] 02/12/2021, 2:24 PM  [facility] [address] [place], [place], [address] Phone: [phone]   Fax:  [phone]  Name: SHAKURA COWING MRN: [id number] Date of Birth: 1941-05-27   Vianne Bulls, OTR/L [facility] [address] [place], [place]  [address] [phone] phone [phone] 02/12/21 2:24 PM

## 2021-02-12 NOTE — Therapy (Signed)
[facility] [address] [place], [place], [address] Phone: [phone]   Fax:  [phone]  Physical Therapy Treatment  Patient Details  Name: Crystal Trevino MRN: [id number] Date of Birth: 12/19/1940 Referring Provider (PT): Ferne Coe PA   Encounter Date: 02/12/2021   PT End of Session - 02/12/21 6962    Visit Number 6    Number of Visits 17    Date for PT Re-Evaluation 03/28/21    Authorization Type BCBS federal    PT Start Time 1230    PT Stop Time 1315    PT Time Calculation (min) 45 min    Equipment Utilized During Treatment Gait belt           Past Medical History:  Diagnosis Date  . Arthritis   . Breast CA ([place])    right breast cancer  . Cancer ([place])    HAD RIGHT BREAST BX --[facility]ING [facility] 12/30/2017  . Constipation 08/13/2011  . Dizziness 08/13/2011  . Family history of adverse reaction to anesthesia    pt mother had PONV  . Family history of breast cancer   . Genetic testing 01/28/2018   Multi-Cancer panel (83 genes) @ [facility] - No pathogenic mutations detected  . GERD (gastroesophageal reflux disease)   . Heart murmur    "SLIGHT HEART MURMUR"  WAS PICKED UP AS AN ADULT  . Hyperlipidemia   . Hypertension   . Hypothyroidism   . Osteoarthritis of knee    Right  . Peripheral vascular disease ([place])   . Reflux   . Splenic artery aneurysm ([facility])    stable 1.5cm by 08/2013 CT; no further surveillance recommended  . Weight loss     Past Surgical History:  Procedure Laterality Date  . ABDOMINAL HYSTERECTOMY    . BACK SURGERY    . BREAST LUMPECTOMY WITH RADIOACTIVE SEED LOCALIZATION Right 02/19/2018   Procedure: BREAST LUMPECTOMY WITH RADIOACTIVE SEED LOCALIZATION. RIGHT;  Surgeon: Alphonsa Overall, MD;  Location: [facility];  Service: General;  Laterality: Right;  . BREAST SURGERY    . CHOLECYSTECTOMY    . KNEE SURGERY    . LOOP RECORDER INSERTION N/A 01/10/2021   Procedure: LOOP  RECORDER INSERTION;  Surgeon: Constance Haw, MD;  Location: [place] [facility];  Service: Cardiovascular;  Laterality: N/A;  . LUMBAR LAMINECTOMY    . TOTAL KNEE ARTHROPLASTY Right 01/04/2018  . TOTAL KNEE ARTHROPLASTY Right 01/04/2018   Procedure: TOTAL KNEE ARTHROPLASTY;  Surgeon: Vickey Huger, MD;  Location: [facility];  Service: Orthopedics;  Laterality: Right;  Marland Kitchen VAGINAL PROLAPSE REPAIR     2015   DONE IN [place]  . VAGINAL PROLAPSE REPAIR  05/20/2018    There were no vitals filed for this visit.   Subjective Assessment - 02/12/21 1239    Subjective continues to do well, denies any deficits    Patient is accompained by: --   friend in lobby   Pertinent History Pt is a 80 yo female admitted secondary to slurred speech. 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 PMHx: R breast cancer s/p lumpectomy and HTN.    Limitations Walking    How long can you sit comfortably? n/a    How long can you stand comfortably? n/a    How long can you walk comfortably? unlimited per patient    Currently in Pain? No/denies  [facility] PT Assessment - 02/12/21 0001      Timed Up and Go Test   Manual TUG (seconds) 11    Cognitive TUG (seconds) 11.4    TUG Comments no need of AD      Functional Gait  Assessment   Gait assessed  Yes    Gait Level Surface Walks 20 ft in less than 7 sec but greater than 5.5 sec, uses assistive device, slower speed, mild gait deviations, or deviates 6-10 in outside of the 12 in walkway width.    Change in Gait Speed Able to smoothly change walking speed without loss of balance or gait deviation. Deviate no more than 6 in outside of the 12 in walkway width.    Gait with Horizontal Head Turns Performs head turns smoothly with no change in gait. Deviates no more than 6 in outside 12 in walkway width    Gait with Vertical Head Turns Performs head turns with no change in gait. Deviates no  more than 6 in outside 12 in walkway width.    Gait and Pivot Turn Pivot turns safely in greater than 3 sec and stops with no loss of balance, or pivot turns safely within 3 sec and stops with mild imbalance, requires small steps to catch balance.    Step Over Obstacle Is able to step over 2 stacked shoe boxes taped together (9 in total height) without changing gait speed. No evidence of imbalance.    Gait with Narrow Base of Support Is able to ambulate for 10 steps heel to toe with no staggering.    Gait with Eyes Closed Walks 20 ft, no assistive devices, good speed, no evidence of imbalance, normal gait pattern, deviates no more than 6 in outside 12 in walkway width. Ambulates 20 ft in less than 7 sec.    Ambulating Backwards Walks 20 ft, no assistive devices, good speed, no evidence for imbalance, normal gait    Steps Alternating feet, no rail.    Total Score 28                         [facility] Adult PT Treatment/Exercise - 02/12/21 0001      Ambulation/Gait   Ambulation/Gait Yes    Ambulation/Gait Assistance 5: Supervision;1: +2 Total assist    Ambulation Distance (Feet) 1000 Feet    Assistive device None    Gait Pattern Step-through pattern    Ambulation Surface Level;Unlevel;Indoor;Outdoor;Grass      Knee/Hip Exercises: Aerobic   Nustep L2 arms 10 x8'                    PT Short Term Goals - 01/31/21 1837      PT SHORT TERM GOAL #1   Title Demo HEP back to PT w/o need of VCs. (all STGs due 02/21/21)    Baseline S required for HPE    Time 4    Period Weeks    Status On-going    Target Date 02/21/21      PT SHORT TERM GOAL #2   Title ambulation of 349ft with LRAD and S across level ground    Baseline in clinic ambulation with close S and SPC    Time 4    Period Weeks    Status On-going    Target Date 02/21/21      PT SHORT TERM GOAL #3   Title assess FGA and establish    Baseline 01/29/21: 25/30 scored this  date    Status Achieved    Target Date  02/21/21             PT Long Term Goals - 01/24/21 1218      PT LONG TERM GOAL #1   Title perform 5x STS in 15s or less w/o need of UE support    Baseline 20.5s with UE support    Time 8    Period Weeks    Status New    Target Date 03/21/21      PT LONG TERM GOAL #2   Title Ambulate 1051ft across unlevel and level ground under distant S with LRAD    Baseline 122ft across level ground with close S and SPC    Time 8    Period Weeks    Status New    Target Date 03/21/21      PT LONG TERM GOAL #3   Title Perform standard TUG in 13 seconds or less with LRAD    Baseline 16.2 s with SPC    Time 8    Period Weeks    Status New    Target Date 03/21/21                 Plan - 02/12/21 1433    Clinical Impression Statement Todays skilled session consisted of assessment of functional tasks and tests, outside ambulation and discussion of possible DC from OPPT    Personal Factors and Comorbidities Age;Comorbidity 2    Comorbidities CVA    Stability/Clinical Decision Making Stable/Uncomplicated    Rehab Potential Good    PT Frequency 2x / week    PT Duration 8 weeks    PT Treatment/Interventions Aquatic Therapy;DME Instruction;Gait training;Stair training;Functional mobility training;Therapeutic activities;Therapeutic exercise;Balance training;Neuromuscular re-education;Patient/family education;Orthotic Fit/Training    PT Next Visit Plan Assess for OPPT DC    PT Home Exercise Plan Access Code: [email or web address]    Consulted and Agree with Plan of Care Patient           Patient will benefit from skilled therapeutic intervention in order to improve the following deficits and impairments:  Abnormal gait,Decreased balance,Decreased endurance,Difficulty walking,Decreased activity tolerance,Decreased coordination,Decreased safety awareness,Decreased strength  Visit Diagnosis: Unsteadiness on feet  Hemiplegia and hemiparesis following other cerebrovascular disease affecting right  non-dominant side [facility])     Problem List Patient Active Problem List   Diagnosis Date Noted  . Normocytic anemia 01/08/2021  . Acute CVA (cerebrovascular accident) ([place]) 01/07/2021  . Pre-diabetes 01/07/2021  . Essential hypertension 01/06/2021  . Hyperlipidemia 01/06/2021  . Hypothyroidism 01/06/2021  . Genetic testing 01/28/2018  . S/P total knee replacement 01/04/2018  . Family history of breast cancer   . 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 Aneurysm of splenic artery ([place]) 08/23/2012    Lanice Shirts PT 02/12/2021, 2:37 PM  [facility] [address] [place], [place], [address] Phone: [phone]   Fax:  [phone]  Name: SOFYA MOUSTAFA MRN: [id number] Date of Birth: 1941-03-16

## 2021-02-13 ENCOUNTER — Ambulatory Visit (INDEPENDENT_AMBULATORY_CARE_PROVIDER_SITE_OTHER): Payer: Federal, State, Local not specified - PPO

## 2021-02-13 DIAGNOSIS — I639 Cerebral infarction, unspecified: Secondary | ICD-10-CM

## 2021-02-14 ENCOUNTER — Ambulatory Visit (INDEPENDENT_AMBULATORY_CARE_PROVIDER_SITE_OTHER): Payer: Federal, State, Local not specified - PPO | Admitting: Adult Health

## 2021-02-14 ENCOUNTER — Other Ambulatory Visit: Payer: Self-pay

## 2021-02-14 ENCOUNTER — Encounter: Payer: Self-pay | Admitting: Adult Health

## 2021-02-14 VITALS — BP 106/62 | HR 82 | Ht 66.0 in | Wt 162.0 lb

## 2021-02-14 DIAGNOSIS — I639 Cerebral infarction, unspecified: Secondary | ICD-10-CM

## 2021-02-14 DIAGNOSIS — I69319 Unspecified symptoms and signs involving cognitive functions following cerebral infarction: Secondary | ICD-10-CM

## 2021-02-14 LAB — CUP PACEART REMOTE DEVICE CHECK
Date Time Interrogation Session: 20220316160203
Implantable Pulse Generator Implant Date: 20220210

## 2021-02-14 NOTE — Progress Notes (Signed)
[facility] [address] [place]. [place] [address] [phone]       HOSPITAL FOLLOW UP NOTE  [name] Date of Birth:  [birth year] Medical Record Number:  [id number]   Reason for Referral:  hospital stroke follow up    SUBJECTIVE:   CHIEF COMPLAINT:  Chief Complaint  Patient presents with  . Follow-up    TR with daughter  [name]) PT is well physically, daughter states she has some cognitive impairment but it is improving. She gets distracted easily, slightly forgetful a time she forgets where she places things and how to use the phone    HPI:   Ms. Crystal Trevino is a 80 y.o. female with history of HLD, HTN, PVD, breast cancer s/p lumpectomy and XRT 2019 and hypothyroidism who presented to [facility] ED on 01/06/2021 with slurred speech, difficulty finding her words, and right facial droop.  Personally reviewed hospitalization pertinent progress notes, lab work and imaging with summary provided.  Evaluated by Dr. Roda Shutters with stroke work-up revealing left MCA infarcts, embolic secondary to unknown source.  Placement of loop recorder to assess for A. fib as potential etiology.  DAPT for 3 weeks and Plavix alone as on aspirin PTA.  LDL 68.  A1c 6.0.  Other stroke risk factors include advanced age and PVD but no prior stroke history.  Evaluated by therapies and recommended inpatient rehab for ongoing therapy needs  Stroke:  left MCA infarcts embolic secondary to unknown source, concerning for embolic.   Code Stroke CT head No acute abnormality. ASPECTS 10.     CTA head & neck no LVO  MRI  L frontal opercular and L parietal infarct; distal L MCA M3 occlusion    Repeat CT head  (01/07/21) showed acute infarct of left frontal lobe with developing hypodensity but no hemorrhage.  LE Doppler  No DVT  2D Echo- LVEF 60 to 65%, mild to moderate AVR and mild MVR  S/p ILR 2/10  LDL 68  HgbA1c 6.0  VTE prophylaxis - Lovenox 40 mg sq daily   aspirin 81 mg daily  prior to admission, now on aspirin 81 mg daily and clopidogrel 75 mg daily. Continue DAPT x 21 days then plavix alone  Therapy recommendations:  CIR --> HPIR  Disposition:  HPIR  Today, 02/14/2021, Ms. Cabell is being seen for hospital follow-up accompanied by her daughter, [name]  Per patient, she has recovered well without residual deficit. Per daughter, she continues to have some cognitive difficulty such as difficulty with multitasking, decreased attention and losing objects easily.  She does report gradual improvement especially since discharge.  Family continues to provide 24-hour supervision -patient questions ongoing need -she is able to maintain ADLs independently but does receive assistance for IADLs -daughter more concerned with safety if she is left alone.  Previously working with SLP but discharged on 3/8 per patient request as she believes she was at baseline - per SLP note, remaining deficits mild cognitive deficits related to executive functioning, attention and recall with decreased awareness Continues working with PT/OT for gait unsteadiness, decreased attention, impulsivity and decreased scanning on right side Denies new or worsening stroke/TIA symptoms  Completed 3 weeks DAPT but apparently has continued on both aspirin and Plavix - daughter questions this as she does not believe she has remained on aspirin but patient reports she has -denies side effects Compliant on simvastatin -denies side effects  blood pressure today 106/62 -monitors at home and typically ranges 100-140/70-80s  Loop recorder  has not shown atrial fibrillation thus far  No further concerns at this time     ROS:   14 system review of systems performed and negative with exception of those listed in HPI  PMH:  Past Medical History:  Diagnosis Date  . Arthritis   . Breast CA ([place])    right breast cancer  . Cancer ([place])    HAD RIGHT BREAST BX --GOING TO [facility] ON WED 12/30/2017  . Constipation  08/13/2011  . Dizziness 08/13/2011  . Family history of adverse reaction to anesthesia    pt mother had PONV  . Family history of breast cancer   . Genetic testing 01/28/2018   Multi-Cancer panel (83 genes) @ [facility] - No pathogenic mutations detected  . GERD (gastroesophageal reflux disease)   . Heart murmur    "SLIGHT HEART MURMUR"  WAS PICKED UP AS AN ADULT  . Hyperlipidemia   . Hypertension   . Hypothyroidism   . Osteoarthritis of knee    Right  . Peripheral vascular disease ([place])   . Reflux   . Splenic artery aneurysm (H[facility])    stable 1.5cm by 08/2013 CT; no further surveillance recommended  . Weight loss     PSH:  Past Surgical History:  Procedure Laterality Date  . ABDOMINAL HYSTERECTOMY    . BACK SURGERY    . BREAST LUMPECTOMY WITH RADIOACTIVE SEED LOCALIZATION Right 02/19/2018   Procedure: BREAST LUMPECTOMY WITH RADIOACTIVE SEED LOCALIZATION. RIGHT;  Surgeon: Alphonsa Overall, MD;  Location: [facility];  Service: General;  Laterality: Right;  . BREAST SURGERY    . CHOLECYSTECTOMY    . KNEE SURGERY    . LOOP RECORDER INSERTION N/A 01/10/2021   Procedure: LOOP RECORDER INSERTION;  Surgeon: Constance Haw, MD;  Location: [place];  Service: Cardiovascular;  Laterality: N/A;  . LUMBAR LAMINECTOMY    . TOTAL KNEE ARTHROPLASTY Right 01/04/2018  . TOTAL KNEE ARTHROPLASTY Right 01/04/2018   Procedure: TOTAL KNEE ARTHROPLASTY;  Surgeon: Vickey Huger, MD;  Location: [facility];  Service: Orthopedics;  Laterality: Right;  Marland Kitchen VAGINAL PROLAPSE REPAIR     2015   DONE IN [place]  . VAGINAL PROLAPSE REPAIR  05/20/2018    Social History:  Social History   Socioeconomic History  . Marital status: Legally Separated    Spouse name: Not on file  . Number of children: Not on file  . Years of education: Not on file  . Highest education level: Not on file  Occupational History  . Not on file  Tobacco Use  . Smoking status: Never Smoker  . Smokeless tobacco: Never Used  Vaping Use   . Vaping Use: Never used  Substance and Sexual Activity  . Alcohol use: No  . Drug use: No  . Sexual activity: Not on file  Other Topics Concern  . Not on file  Social History Narrative  . Not on file   Social Determinants of Health   Financial Resource Strain: Not on file  Food Insecurity: Not on file  Transportation Needs: Not on file  Physical Activity: Not on file  Stress: Not on file  Social Connections: Not on file  Intimate Partner Violence: Not on file    Family History:  Family History  Problem Relation Age of Onset  . Heart disease Mother   . Breast cancer Mother 62       deceased 26; TAH/BSO (age?)  . Heart disease Father   . Breast cancer Daughter  dx 94s; currenlty 51; Neg BRCA1/BRCA2  . Breast cancer Maternal Aunt 44       deceased 64    Medications:   Current Outpatient Medications on File Prior to Visit  Medication Sig Dispense Refill  . carvedilol (COREG) 12.5 MG tablet Take 12.5 mg by mouth 2 (two) times daily with a meal.    . clopidogrel (PLAVIX) 75 MG tablet Take 75 mg by mouth daily.    . fluticasone (FLONASE) 50 MCG/ACT nasal spray Place 1 spray into both nostrils daily as needed for allergies or rhinitis.    Marland Kitchen gabapentin (NEURONTIN) 300 MG capsule Take 1 capsule (300 mg total) by mouth at bedtime. 90 capsule 4  . levothyroxine (SYNTHROID, LEVOTHROID) 75 MCG tablet Take 75 mcg by mouth daily before breakfast.    . Multiple Vitamins-Minerals (CENTRUM SILVER) tablet Take 1 tablet by mouth daily.    Marland Kitchen senna-docusate (SENOKOT-S) 8.6-50 MG tablet Take 1 tablet by mouth at bedtime as needed for mild constipation. 30 tablet 0  . simvastatin (ZOCOR) 20 MG tablet Take 20 mg by mouth at bedtime.     No current facility-administered medications on file prior to visit.    Allergies:   Allergies  Allergen Reactions  . Penicillins Nausea And Vomiting, Rash and Other (See Comments)    PATIENT HAS HAD A PCN REACTION WITH IMMEDIATE RASH,  FACIAL/TONGUE/THROAT SWELLING, SOB, OR LIGHTHEADEDNESS WITH HYPOTENSION:  #  #  #  YES  #  #  #   Has patient had a PCN reaction causing severe rash involving mucus membranes or skin necrosis: No Has patient had a PCN reaction that required hospitalization: No Has patient had a PCN reaction occurring within the last 10 years: No       OBJECTIVE:  Physical Exam  Vitals:   02/14/21 1458  BP: 106/62  Pulse: 82  Weight: 162 lb (73.5 kg)  Height: $Remove'5\' 6"'IbrIFkJ$  (1.676 m)   Body mass index is 26.15 kg/m. No exam data present  General: well developed, well nourished,  pleasant elderly Caucasian female, seated, in no evident distress Head: head normocephalic and atraumatic.   Neck: supple with no carotid or supraclavicular bruits Cardiovascular: regular rate and rhythm, no murmurs Musculoskeletal: no deformity Skin:  no rash/petichiae Vascular:  Normal pulses all extremities   Neurologic Exam Mental Status: Awake and fully alert.   Fluent speech and language.  Oriented to place and time. Recent memory impaired and remote memory intact. Attention span, concentration and fund of knowledge impaired and will become easily frustrated or annoyed during conversation (especially with daughter speaking of her deficits) Cranial Nerves: Fundoscopic exam reveals sharp disc margins. Pupils equal, briskly reactive to light. Extraocular movements full without nystagmus. Visual fields full to confrontation. Hearing intact. Facial sensation intact. Face, tongue, palate moves normally and symmetrically.  Motor: Normal bulk and tone. Normal strength in all tested extremity muscles Sensory.: intact to touch , pinprick , position and vibratory sensation.  Coordination: Rapid alternating movements normal in all extremities. Finger-to-nose and heel-to-shin performed accurately bilaterally. Gait and Station: Arises from chair without difficulty. Stance is normal. Gait demonstrates normal stride length with mild  unsteadiness without use of assistive device.  Tandem walk and heel toe not attempted.  Reflexes: 1+ and symmetric. Toes downgoing.     NIHSS  0 Modified Rankin  2-3      ASSESSMENT: Crystal Trevino is a 80 y.o. year old female presented with slurred speech, word finding difficulty and right facial droop  on 01/06/2021 with stroke work-up revealing left MCA infarcts (L frontal opercular and L parietal) with distal left MCA M3 occlusion, embolic secondary to unknown source s/p ILR. Vascular risk factors include HTN, HLD, PVD and advanced age.      PLAN:  1. L MCA stroke, cryptogenic:  a. Residual deficit: Cognitive deficits and gait unsteadiness.  i. Continue working with PT/OT and discussed importance of routinely doing exercises at home ii. Apparent decreased awareness of deficits which could pose safety concerns.  Advised daughter to speak further with therapy regarding recommendations of continued supervision iii. Would not recommend return to driving due to residual cognitive impairments - may consider driving evaluation in the future if needed b. Loop recorder has not shown atrial fibrillation thus far - will continue to be monitored by cardiology c. Continue clopidogrel 75 mg daily  and simvastatin 20 mg daily for secondary stroke prevention.  Advised to discontinue aspirin as 3 weeks DAPT completed and no indication for prolonged use d. Discussed secondary stroke prevention measures and importance of close PCP follow up for aggressive stroke risk factor management  2. HTN: BP goal <130/90.  Stable on carvedilol per PCP 3. HLD: LDL goal <70. Recent LDL 68 on simvastatin 20 mg daily.      Follow up in 3 months or call earlier if needed   CC:  [facility] provider: Dr. Leonie Man PCP: Gwyndolyn Kaufman, PA-C    I spent 45 minutes of face-to-face and non-face-to-face time with patient and daughter.  This included previsit chart review including recent hospitalization pertinent progress  notes, lab work and imaging, lab review, study review, order entry, electronic health record documentation, patient education regarding recent stroke and possible etiologies, indication of loop recorder, residual deficits, importance of managing stroke risk factors and answered all other questions to patient and daughters satisfaction   Frann Rider, AGNP-BC  [facility] [address] [place], [place] [address]  Phone [phone] Fax [phone] Note: This document was prepared with digital dictation and possible smart phrase technology. Any transcriptional errors that result from this process are unintentional.

## 2021-02-14 NOTE — Patient Instructions (Signed)
Continue working with therapies and ongoing exercises at home as recommended  Continue clopidogrel 75 mg daily  and simvastatin for secondary stroke prevention  Continue to follow up with PCP regarding cholesterol and blood pressure management  Maintain strict control of hypertension with blood pressure goal below 130/90 and cholesterol with LDL cholesterol (bad cholesterol) goal below 70 mg/dL.       Followup in the future with me in 3 months or call earlier if needed       Thank you for coming to see [place] at [facility]. I hope we have been able to provide you high quality care today.  You may receive a patient satisfaction survey over the next few weeks. We would appreciate your feedback and comments so that we may continue to improve ourselves and the health of our patients.

## 2021-02-15 ENCOUNTER — Ambulatory Visit: Payer: Federal, State, Local not specified - PPO

## 2021-02-15 ENCOUNTER — Encounter: Payer: Self-pay | Admitting: Occupational Therapy

## 2021-02-15 ENCOUNTER — Other Ambulatory Visit: Payer: Self-pay

## 2021-02-15 ENCOUNTER — Ambulatory Visit: Payer: Federal, State, Local not specified - PPO | Admitting: Occupational Therapy

## 2021-02-15 DIAGNOSIS — R2681 Unsteadiness on feet: Secondary | ICD-10-CM | POA: Diagnosis not present

## 2021-02-15 DIAGNOSIS — R41840 Attention and concentration deficit: Secondary | ICD-10-CM

## 2021-02-15 DIAGNOSIS — R41842 Visuospatial deficit: Secondary | ICD-10-CM

## 2021-02-15 DIAGNOSIS — I69853 Hemiplegia and hemiparesis following other cerebrovascular disease affecting right non-dominant side: Secondary | ICD-10-CM

## 2021-02-15 DIAGNOSIS — M6281 Muscle weakness (generalized): Secondary | ICD-10-CM

## 2021-02-15 DIAGNOSIS — R41844 Frontal lobe and executive function deficit: Secondary | ICD-10-CM

## 2021-02-15 DIAGNOSIS — I639 Cerebral infarction, unspecified: Secondary | ICD-10-CM

## 2021-02-15 DIAGNOSIS — R278 Other lack of coordination: Secondary | ICD-10-CM

## 2021-02-15 NOTE — Therapy (Signed)
[facility] [address] [place], [place], [address] Phone: [phone]   Fax:  [phone]  Occupational Therapy Treatment  Patient Details  Name: Crystal Trevino MRN: [id number] Date of Birth: 1941-09-09 Referring Provider (OT): Leonides Schanz [place] ([place])   Encounter Date: 02/15/2021   OT End of Session - 02/15/21 1354    Visit Number 7    Number of Visits 13    Date for OT Re-Evaluation 03/21/21    Authorization Type BCBS Federal    Authorization Time Period 58 PT/OT/ST $25 copay    Authorization - Visit Number 7    Authorization - Number of Visits 25    OT Start Time 6811    OT Stop Time 1436    OT Time Calculation (min) 42 min    Activity Tolerance Patient tolerated treatment well    Behavior During Therapy [facility] for tasks assessed/performed;Flat affect           Past Medical History:  Diagnosis Date  . Arthritis   . Breast CA ([place])    right breast cancer  . Cancer ([place])    HAD RIGHT BREAST BX --GOING TO CANCER C[facility] ON [facility]ED 12/30/2017  . Constipation 08/13/2011  . Dizziness 08/13/2011  . Family history of adverse reaction to anesthesia    pt mother had PONV  . Family history of breast cancer   . Genetic testing 01/28/2018   Multi-Cancer panel (83 genes) @ [facility] - No pathogenic mutations detected  . GERD (gastroesophageal reflux disease)   . Heart murmur    "SLIGHT HEART MURMUR"  WAS PICKED UP AS AN ADULT  . Hyperlipidemia   . Hypertension   . Hypothyroidism   . Osteoarthritis of knee    Right  . Peripheral vascular disease ([place])   . Reflux   . Splenic artery aneurysm ([facility])    stable 1.5cm by 08/2013 CT; no further surveillance recommended  . Weight loss     Past Surgical History:  Procedure Laterality Date  . ABDOMINAL HYSTERECTOMY    . BACK SURGERY    . BREAST LUMPECTOMY WITH RADIOACTIVE SEED LOCALIZATION Right 02/19/2018   Procedure: BREAST LUMPECTOMY WITH RADIOACTIVE SEED LOCALIZATION.  RIGHT;  Surgeon: Alphonsa Overall, MD;  Location: [facility];  Service: General;  Laterality: Right;  . BREAST SURGERY    . CHOLECYSTECTOMY    . KNEE SURGERY    . LOOP RECORDER INSERTION N/A 01/10/2021   Procedure: LOOP RECORDER INSERTION;  Surgeon: Constance Haw, MD;  Location: [place];  Service: Cardiovascular;  Laterality: N/A;  . LUMBAR LAMINECTOMY    . TOTAL KNEE ARTHROPLASTY Right 01/04/2018  . TOTAL KNEE ARTHROPLASTY Right 01/04/2018   Procedure: TOTAL KNEE ARTHROPLASTY;  Surgeon: Vickey Huger, MD;  Location: [facility];  Service: Orthopedics;  Laterality: Right;  Marland Kitchen VAGINAL PROLAPSE REPAIR     2015   DONE IN [place]  . VAGINAL PROLAPSE REPAIR  05/20/2018    There were no vitals filed for this visit.   Subjective Assessment - 02/15/21 1355    Subjective  Pt's daughter is present today and reports had some difficulty with scanning at the grocery store and with STM. Otherwise patient reports doing well and with little difficulty.    Pertinent History PMHx: R breast cancer s/p lumpectomy and HTN.    Limitations Fall Risk. Loop recorder    Patient Stated Goals Pt reported no deficits.    Currently in Pain? No/denies  Treatment:  Cooking - following directions and making cornbread. Pt required verbal cues for sequencing (preheating oven, checking time before throwing away box, etc) and with recall to set timer for attending to time cornbread cooks. Pt set alarm on phone but required moderate assistance for checking AM/PM and successfully setting time this day. Pt checked timer on 2 occassions while completing cognitive task while cornbread cooked without prompting. OT had to remind patient to check timer and alarm for checking items in the oven after allotted time for cooking. Pt turned off oven appropriately.   Constant Therapy Alternating Symbols Level 6 - deficits with attention to detail and checking over board and scanning to the right. 91% accuracy and 85.48s  response time  Logic Links entry level with moderate assistance for problem solving and attending to details of the clues on the first puzzle. On puzzles 2, 3 and 4 patient did with greater independence requiring minimal assistance and cues.                    OT Short Term Goals - 02/08/21 1344      OT SHORT TERM GOAL #1   Title Pt will be independent with HEP for grip strengthening RUE 02/21/21    Time 4    Period Weeks    Status On-going    Target Date 02/21/21      OT SHORT TERM GOAL #2   Title Pt will perform environmental scanning with 90% accuracy or greater.    Baseline 80% with all 3 errors on right    Time 4    Period Weeks    Status Achieved   92%     OT SHORT TERM GOAL #3   Title Pt will perform warm meal prep and/or home management tasks with mod I    Time 4    Period Weeks    Status New      OT SHORT TERM GOAL #4   Title Pt will improve grip strength in RUE by 5 lbs in order to increase functional use of RUE    Baseline RUE 27.9 lbs, LUE 45.4 lbs    Time 4    Period Weeks    Status On-going             OT Long Term Goals - 01/24/21 1244      OT LONG TERM GOAL #1   Title Pt will be independent with any updated HEP 03/21/21    Time 8    Period Weeks    Status New    Target Date 03/21/21      OT LONG TERM GOAL #2   Title Pt will perform physical and cognitive task simultaneously with 95% accuracy    Time 8    Period Weeks    Status New      OT LONG TERM GOAL #3   Title Pt will return to prior level of functioning for cooking and home management.    Time 8    Period Weeks    Status New      OT LONG TERM GOAL #4   Title Pt will improve grip strength to 35 lbs or greater in RUE    Baseline RUE 27.9 lbs    Time 8    Period Weeks    Status New                 Plan - 02/15/21 1416    Clinical Impression Statement Pt with continued  deficits with attention and sequencing but continues to make progress.    OT Occupational  Profile and History Problem Focused Assessment - Including review of records relating to presenting problem    Occupational performance deficits (Please refer to evaluation for details): IADL's;Leisure;ADL's    Body Structure / Function / Physical Skills ADL;Coordination;IADL;GMC;Dexterity;Decreased knowledge of use of DME;FMC;UE functional use;Vision    Cognitive Skills Attention;Safety Awareness;Perception;Understand    Rehab Potential Good    Clinical Decision Making Limited treatment options, no task modification necessary    Comorbidities Affecting Occupational Performance: None    Modification or Assistance to Complete Evaluation  No modification of tasks or assist necessary to complete eval    OT Frequency 2x / week    OT Duration 8 weeks   2x/week for 4 weeks and 1x/week for 4 weeks or 12 visits over 8 weeks.   OT Treatment/Interventions Self-care/ADL training;DME and/or AE instruction;Passive range of motion;Therapeutic activities;Functional Mobility Training;Neuromuscular education;Patient/family education;Visual/perceptual remediation/compensation;Therapeutic exercise;Cognitive remediation/compensation    Plan divided attention with environmental scanning, begin checking STGs    Consulted and Agree with Plan of Care Patient;Family member/caregiver    Family Member Consulted son, [name]           Patient will benefit from skilled therapeutic intervention in order to improve the following deficits and impairments:   Body Structure / Function / Physical Skills: ADL,Coordination,IADL,GMC,Dexterity,Decreased knowledge of use of DME,FMC,UE functional use,Vision Cognitive Skills: Attention,Safety Awareness,Perception,Understand     Visit Diagnosis: Visuospatial deficit  Attention and concentration deficit  Other lack of coordination  Frontal lobe and executive function deficit  Hemiplegia and hemiparesis following other cerebrovascular disease affecting right non-dominant side  [facility])    Problem List Patient Active Problem List   Diagnosis Date Noted  . Normocytic anemia 01/08/2021  . Acute CVA (cerebrovascular accident) ([place]) 01/07/2021  . Pre-diabetes 01/07/2021  . Essential hypertension 01/06/2021  . Hyperlipidemia 01/06/2021  . Hypothyroidism 01/06/2021  . Genetic testing 01/28/2018  . S/P total knee replacement 01/04/2018  . Family history of breast cancer   . 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 Aneurysm of splenic artery ([place]) 08/23/2012    Zachery Conch MOT, OTR/L  02/15/2021, 2:41 PM  [facility] [address] [place], [place], [address] Phone: [phone]   Fax:  [phone]  Name: Crystal Trevino MRN: [id number] Date of Birth: 1941/11/06

## 2021-02-16 NOTE — Therapy (Signed)
[facility] [address] [place], [place], [address] Phone: [phone]   Fax:  [phone]  Physical Therapy Treatment/Discharge Summary  Patient Details  Name: Crystal Trevino MRN: [id number] Date of Birth: Sep 08, 1941 Referring Provider (PT): Ferne Coe PA   Encounter Date: 02/15/2021   PT End of Session - 02/15/21 1440    Visit Number 7    Number of Visits 17    Date for PT Re-Evaluation 03/28/21    Authorization Type BCBS federal    PT Start Time 1315    PT Stop Time 1400    PT Time Calculation (min) 45 min    Equipment Utilized During Treatment Gait belt    Activity Tolerance Patient tolerated treatment well    Behavior During Therapy [facility] for tasks assessed/performed;Flat affect           Past Medical History:  Diagnosis Date  . Arthritis   . Breast CA ([place])    right breast cancer  . Cancer ([place])    HAD RIGHT BREAST BX --[facility]ING [facility] 12/30/2017  . Constipation 08/13/2011  . Dizziness 08/13/2011  . Family history of adverse reaction to anesthesia    pt mother had PONV  . Family history of breast cancer   . Genetic testing 01/28/2018   Multi-Cancer panel (83 genes) @ [facility] - No pathogenic mutations detected  . GERD (gastroesophageal reflux disease)   . Heart murmur    "SLIGHT HEART MURMUR"  WAS PICKED UP AS AN ADULT  . Hyperlipidemia   . Hypertension   . Hypothyroidism   . Osteoarthritis of knee    Right  . Peripheral vascular disease ([place])   . Reflux   . Splenic artery aneurysm ([facility])    stable 1.5cm by 08/2013 CT; no further surveillance recommended  . Weight loss     Past Surgical History:  Procedure Laterality Date  . ABDOMINAL HYSTERECTOMY    . BACK SURGERY    . BREAST LUMPECTOMY WITH RADIOACTIVE SEED LOCALIZATION Right 02/19/2018   Procedure: BREAST LUMPECTOMY WITH RADIOACTIVE SEED LOCALIZATION. RIGHT;  Surgeon: Alphonsa Overall, MD;  Location: [facility];  Service: General;   Laterality: Right;  . BREAST SURGERY    . CHOLECYSTECTOMY    . KNEE SURGERY    . LOOP RECORDER INSERTION N/A 01/10/2021   Procedure: LOOP RECORDER INSERTION;  Surgeon: Constance Haw, MD;  Location: [facility] CV LAB;  Service: Cardiovascular;  Laterality: N/A;  . LUMBAR LAMINECTOMY    . TOTAL KNEE ARTHROPLASTY Right 01/04/2018  . TOTAL KNEE ARTHROPLASTY Right 01/04/2018   Procedure: TOTAL KNEE ARTHROPLASTY;  Surgeon: Vickey Huger, MD;  Location: [facility];  Service: Orthopedics;  Laterality: Right;  Marland Kitchen VAGINAL PROLAPSE REPAIR     2015   DONE IN [place]  . VAGINAL PROLAPSE REPAIR  05/20/2018    There were no vitals filed for this visit.   Subjective Assessment - 02/15/21 1358    Subjective No changes, denies any strength or mobility deficits including LOB    Patient is accompained by: --   friend in lobby   Pertinent History Pt is a 80 yo female admitted secondary to slurred speech. 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 PMHx: R breast cancer s/p lumpectomy and HTN.    Limitations Walking    How long can you sit comfortably? n/a    How long can you stand  comfortably? n/a    How long can you walk comfortably? unlimited per patient                             [facility] Adult PT Treatment/Exercise - 02/16/21 0001      Ambulation/Gait   Ambulation/Gait Yes    Ambulation/Gait Assistance 5: Supervision    Ambulation/Gait Assistance Details added head turns and nods randomly    Ambulation Distance (Feet) 1000 Feet    Assistive device None    Gait Pattern Step-through pattern    Ambulation Surface Level;Indoor;Unlevel;Outdoor;Grass    Gait Comments added indoor ambulation including stair negotiation while carrying 10# wt          See goal section for tests/activities performed          PT Short Term Goals - 02/15/21 1401      PT SHORT TERM GOAL #1   Title Demo HEP back to PT  w/o need of VCs. (all STGs due 02/21/21)    Baseline S required for HPE;  Patient able to demo HEP back to PT    Time 4    Period Weeks    Status Achieved    Target Date 02/21/21      PT SHORT TERM GOAL #2   Title ambulation of 333f with LRAD and S across level ground    Baseline in clinic ambulation with close S and SPC; 02/15/21 Patient able to ambulate 10059facross all outdoor surfaces including grass w/o AD under S only    Time 4    Period Weeks    Status Achieved    Target Date 02/21/21      PT SHORT TERM GOAL #3   Title assess FGA and establish    Baseline 01/29/21: 25/30 scored this date; 02/13/21 score of 28/30    Status Achieved    Target Date 02/21/21             PT Long Term Goals - 02/15/21 1404      PT LONG TERM GOAL #1   Title perform 5x STS in 15s or less w/o need of UE support    Baseline 20.5s with UE support; 02/15/21 5x STS score 12.82 s w/o UE assist    Time 8    Period Weeks    Status Achieved      PT LONG TERM GOAL #2   Title Ambulate 100032fcross unlevel and level ground under distant S with LRAD    Baseline 1000f64fross level ground with distant S and no AD; 02/15/21 1000ft31fulation w/o AD under distanrt S including grass    Time 8    Period Weeks    Status Achieved      PT LONG TERM GOAL #3   Title Perform standard TUG in 13 seconds or less with LRAD    Baseline 16.2 s with SPC; 02/15/21 TUG score 10.1s    Time 8    Period Weeks    Status Achieved                 Plan - 02/15/21 1441    Clinical Impression Statement patient seen for goal pprogress assessment and appropriateness of DC    Personal Factors and Comorbidities Age;Comorbidity 2    Comorbidities CVA    Stability/Clinical Decision Making Stable/Uncomplicated    Rehab Potential Good    PT Frequency 2x / week    PT Duration 8 weeks  PT Treatment/Interventions Aquatic Therapy;DME Instruction;Gait training;Stair training;Functional mobility training;Therapeutic  activities;Therapeutic exercise;Balance training;Neuromuscular re-education;Patient/family education;Orthotic Fit/Training    PT Next Visit Plan OPPT DC    PT Home Exercise Plan Access Code: [email or web address]    Consulted and Agree with Plan of Care Patient           Patient will benefit from skilled therapeutic intervention in order to improve the following deficits and impairments:  Abnormal gait,Decreased balance,Decreased endurance,Difficulty walking,Decreased activity tolerance,Decreased coordination,Decreased safety awareness,Decreased strength  Visit Diagnosis: Unsteadiness on feet  Acute CVA (cerebrovascular accident) ([place])  Muscle weakness (generalized)     Problem List Patient Active Problem List   Diagnosis Date Noted  . Normocytic anemia 01/08/2021  . Acute CVA (cerebrovascular accident) ([place]) 01/07/2021  . Pre-diabetes 01/07/2021  . Essential hypertension 01/06/2021  . Hyperlipidemia 01/06/2021  . Hypothyroidism 01/06/2021  . Genetic testing 01/28/2018  . S/P total knee replacement 01/04/2018  . Family history of breast cancer   . 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 Aneurysm of splenic artery ([facility]) 08/23/2012   PHYSICAL THERAPY DISCHARGE SUMMARY  Visits from Start of Care: 7  Current functional level related to goals / functional outcomes: All goals met   Remaining deficits: none   Education / Equipment: HEP Plan: Patient agrees to discharge.  Patient goals were not met. Patient is being discharged due to meeting the stated rehab goals.  ?????      Lanice Shirts PT 02/16/2021, 2:47 PM  [facility] [address] [place], [place], [address] Phone: [phone]   Fax:  [phone]  Name: Crystal Trevino MRN: [id number] Date of Birth: 04-15-1941

## 2021-02-16 NOTE — Patient Instructions (Signed)
Reviewed HEP with patient and daughter

## 2021-02-17 NOTE — Progress Notes (Signed)
I agree with the above plan 

## 2021-02-19 ENCOUNTER — Ambulatory Visit: Payer: Federal, State, Local not specified - PPO

## 2021-02-19 ENCOUNTER — Encounter: Payer: Self-pay | Admitting: Occupational Therapy

## 2021-02-19 ENCOUNTER — Ambulatory Visit: Payer: Federal, State, Local not specified - PPO | Admitting: Occupational Therapy

## 2021-02-19 ENCOUNTER — Other Ambulatory Visit: Payer: Self-pay

## 2021-02-19 DIAGNOSIS — R41840 Attention and concentration deficit: Secondary | ICD-10-CM

## 2021-02-19 DIAGNOSIS — R41842 Visuospatial deficit: Secondary | ICD-10-CM

## 2021-02-19 DIAGNOSIS — I69853 Hemiplegia and hemiparesis following other cerebrovascular disease affecting right non-dominant side: Secondary | ICD-10-CM

## 2021-02-19 DIAGNOSIS — R41844 Frontal lobe and executive function deficit: Secondary | ICD-10-CM

## 2021-02-19 DIAGNOSIS — R278 Other lack of coordination: Secondary | ICD-10-CM

## 2021-02-19 DIAGNOSIS — R2681 Unsteadiness on feet: Secondary | ICD-10-CM | POA: Diagnosis not present

## 2021-02-19 NOTE — Therapy (Signed)
[facility] [address] [place], [place], [address] Phone: [phone]   Fax:  [phone]  Occupational Therapy Treatment  Patient Details  Name: Crystal Trevino MRN: [id number] Date of Birth: 17-Jul-1941 Referring Provider (OT): Leonides Schanz Yoe ([place])   Encounter Date: 02/19/2021   OT End of Session - 02/19/21 1237    Visit Number 8    Number of Visits 13    Date for OT Re-Evaluation 03/21/21    Authorization Type BCBS Federal    Authorization Time Period 29 PT/OT/ST $25 copay    Authorization - Visit Number 8    Authorization - Number of Visits 25    OT Start Time 8786    OT Stop Time 1315    OT Time Calculation (min) 40 min    Activity Tolerance Patient tolerated treatment well    Behavior During Therapy [facility] for tasks assessed/performed           Past Medical History:  Diagnosis Date  . Arthritis   . Breast CA ([place])    right breast cancer  . Cancer ([place])    HAD RIGHT BREAST BX --[facility]ING [facility] 12/30/2017  . Constipation 08/13/2011  . Dizziness 08/13/2011  . Family history of adverse reaction to anesthesia    pt mother had PONV  . Family history of breast cancer   . Genetic testing 01/28/2018   Multi-Cancer panel (83 genes) @ [facility] - No pathogenic mutations detected  . GERD (gastroesophageal reflux disease)   . Heart murmur    "SLIGHT HEART MURMUR"  WAS PICKED UP AS AN ADULT  . Hyperlipidemia   . Hypertension   . Hypothyroidism   . Osteoarthritis of knee    Right  . Peripheral vascular disease ([place])   . Reflux   . Splenic artery aneurysm ([facility])    stable 1.5cm by 08/2013 CT; no further surveillance recommended  . Weight loss     Past Surgical History:  Procedure Laterality Date  . ABDOMINAL HYSTERECTOMY    . BACK SURGERY    . BREAST LUMPECTOMY WITH RADIOACTIVE SEED LOCALIZATION Right 02/19/2018   Procedure: BREAST LUMPECTOMY WITH RADIOACTIVE SEED LOCALIZATION. RIGHT;  Surgeon:  Alphonsa Overall, MD;  Location: [facility];  Service: General;  Laterality: Right;  . BREAST SURGERY    . CHOLECYSTECTOMY    . KNEE SURGERY    . LOOP RECORDER INSERTION N/A 01/10/2021   Procedure: LOOP RECORDER INSERTION;  Surgeon: Constance Haw, MD;  Location: [facility] CV LAB;  Service: Cardiovascular;  Laterality: N/A;  . LUMBAR LAMINECTOMY    . TOTAL KNEE ARTHROPLASTY Right 01/04/2018  . TOTAL KNEE ARTHROPLASTY Right 01/04/2018   Procedure: TOTAL KNEE ARTHROPLASTY;  Surgeon: Vickey Huger, MD;  Location: [facility];  Service: Orthopedics;  Laterality: Right;  Marland Kitchen VAGINAL PROLAPSE REPAIR     2015   DONE IN [place]  . VAGINAL PROLAPSE REPAIR  05/20/2018    There were no vitals filed for this visit.   Subjective Assessment - 02/19/21 1237    Subjective  "It's a challenge to make sure I do all my chores"    Pertinent History PMHx: R breast cancer s/p lumpectomy and HTN.    Limitations Fall Risk. Loop recorder    Patient Stated Goals Pt reported no deficits.    Currently in Pain? No/denies                   TREATMENT  Constant Therapy: Read a  map level 2 with 80% accuracy. Alphabetizing alternating words (uppercase/lowercase) Level 1 with increased difficulty with alternating attention. Pt did not finish d/t time.   Spot the Difference: Pt was able to identify 4/7 differences in appropriate amount of time. Pt required 1 v.c. for counting differences and looking for remainder. Pt required increased time.   Physical/Cognitive: simultaneously with tossing ball and ambulating around clinic. Pt with subtracting by 3's. Pt required increased time and max difficulty in max distracting environment. 1 error but increased time required.             OT Short Term Goals - 02/19/21 1241      OT SHORT TERM GOAL #1   Title Pt will be independent with HEP for grip strengthening RUE 02/21/21    Time 4    Period Weeks    Status On-going    Target Date 02/21/21      OT SHORT TERM  GOAL #2   Title Pt will perform environmental scanning with 90% accuracy or greater.    Baseline 80% with all 3 errors on right    Time 4    Period Weeks    Status Achieved   92%     OT SHORT TERM GOAL #3   Title Pt will perform warm meal prep and/or home management tasks with mod I    Time 4    Period Weeks    Status On-going   pt required verbal cues 02/15/21     OT SHORT TERM GOAL #4   Title Pt will improve grip strength in RUE by 5 lbs in order to increase functional use of RUE    Baseline RUE 27.9 lbs, LUE 45.4 lbs    Time 4    Period Weeks    Status Achieved   32.4 lbs 02/19/21            OT Long Term Goals - 02/19/21 1240      OT LONG TERM GOAL #1   Title Pt will be independent with any updated HEP 03/21/21    Time 8    Period Weeks    Status On-going      OT LONG TERM GOAL #2   Title Pt will perform physical and cognitive task simultaneously with 95% accuracy    Time 8    Period Weeks    Status New      OT LONG TERM GOAL #3   Title Pt will return to prior level of functioning for cooking and home management.    Time 8    Period Weeks    Status On-going      OT LONG TERM GOAL #4   Title Pt will improve grip strength to 35 lbs or greater in RUE    Baseline RUE 27.9 lbs    Time 8    Period Weeks    Status On-going   32.4 lbs with RUE - 02/19/21                Plan - 02/19/21 1316    Clinical Impression Statement Pt more aware of deficits this day with alternating attention.    OT Occupational Profile and History Problem Focused Assessment - Including review of records relating to presenting problem    Occupational performance deficits (Please refer to evaluation for details): IADL's;Leisure;ADL's    Body Structure / Function / Physical Skills ADL;Coordination;IADL;GMC;Dexterity;Decreased knowledge of use of DME;FMC;UE functional use;Vision    Cognitive Skills Attention;Safety Awareness;Perception;Understand    Rehab  Potential Good    Clinical  Decision Making Limited treatment options, no task modification necessary    Comorbidities Affecting Occupational Performance: None    Modification or Assistance to Complete Evaluation  No modification of tasks or assist necessary to complete eval    OT Frequency 2x / week    OT Duration 8 weeks   2x/week for 4 weeks and 1x/week for 4 weeks or 12 visits over 8 weeks.   OT Treatment/Interventions Self-care/ADL training;DME and/or AE instruction;Passive range of motion;Therapeutic activities;Functional Mobility Training;Neuromuscular education;Patient/family education;Visual/perceptual remediation/compensation;Therapeutic exercise;Cognitive remediation/compensation    Plan divided attention with environmental scanning, begin checking STGs    Consulted and Agree with Plan of Care Patient;Family member/caregiver    Family Member Consulted son, [name]           Patient will benefit from skilled therapeutic intervention in order to improve the following deficits and impairments:   Body Structure / Function / Physical Skills: ADL,Coordination,IADL,GMC,Dexterity,Decreased knowledge of use of DME,FMC,UE functional use,Vision Cognitive Skills: Attention,Safety Awareness,Perception,Understand     Visit Diagnosis: Visuospatial deficit  Attention and concentration deficit  Other lack of coordination  Frontal lobe and executive function deficit  Hemiplegia and hemiparesis following other cerebrovascular disease affecting right non-dominant side [facility])    Problem List Patient Active Problem List   Diagnosis Date Noted  . Normocytic anemia 01/08/2021  . Acute CVA (cerebrovascular accident) ([place]) 01/07/2021  . Pre-diabetes 01/07/2021  . Essential hypertension 01/06/2021  . Hyperlipidemia 01/06/2021  . Hypothyroidism 01/06/2021  . Genetic testing 01/28/2018  . S/P total knee replacement 01/04/2018  . Family history of breast cancer   . Ductal carcinoma in situ (DCIS) of right breast  12/23/2017  . Aneurysm of splenic artery ([facility]) 08/23/2012    Zachery Conch MOT, OTR/L  02/19/2021, 2:22 PM  [facility] [address] [place], [place], [address] Phone: [phone]   Fax:  [phone]  Name: Crystal Trevino MRN: [id number] Date of Birth: 07-Oct-1941

## 2021-02-21 NOTE — Progress Notes (Signed)
[facility] Summary Report / Loop Recorder 

## 2021-02-22 ENCOUNTER — Ambulatory Visit: Payer: Federal, State, Local not specified - PPO | Admitting: Physical Therapy

## 2021-02-22 ENCOUNTER — Ambulatory Visit: Payer: Federal, State, Local not specified - PPO | Admitting: Occupational Therapy

## 2021-02-22 ENCOUNTER — Other Ambulatory Visit: Payer: Self-pay

## 2021-02-22 ENCOUNTER — Encounter: Payer: Self-pay | Admitting: Occupational Therapy

## 2021-02-22 DIAGNOSIS — R41840 Attention and concentration deficit: Secondary | ICD-10-CM

## 2021-02-22 DIAGNOSIS — R41842 Visuospatial deficit: Secondary | ICD-10-CM

## 2021-02-22 DIAGNOSIS — R2681 Unsteadiness on feet: Secondary | ICD-10-CM | POA: Diagnosis not present

## 2021-02-22 DIAGNOSIS — I69853 Hemiplegia and hemiparesis following other cerebrovascular disease affecting right non-dominant side: Secondary | ICD-10-CM

## 2021-02-22 DIAGNOSIS — R41844 Frontal lobe and executive function deficit: Secondary | ICD-10-CM

## 2021-02-22 DIAGNOSIS — R278 Other lack of coordination: Secondary | ICD-10-CM

## 2021-02-22 NOTE — Therapy (Signed)
[facility] [address] [place], [place], [address] Phone: [phone]   Fax:  [phone]  Occupational Therapy Treatment & Recertification  Patient Details  Name: Crystal Trevino MRN: [id number] Date of Birth: 05-27-1941 Referring Provider (OT): Leonides Schanz [place] ([place])   Encounter Date: 02/22/2021   OT End of Session - 02/22/21 0846    Visit Number 9    Number of Visits 17   adjusted at renewal   Date for OT Re-Evaluation 03/29/21    Authorization Type BCBS Federal    Authorization Time Period 12 PT/OT/ST $25 copay (week 4 of 8)    Authorization - Visit Number 9    Authorization - Number of Visits 25    OT Start Time [phone]    OT Stop Time 0932    OT Time Calculation (min) 46 min    Activity Tolerance Patient tolerated treatment well    Behavior During Therapy [facility] for tasks assessed/performed           Past Medical History:  Diagnosis Date  . Arthritis   . Breast CA ([place])    right breast cancer  . Cancer ([place])    HAD RIGHT BREAST BX --GOING [facility] 12/30/2017  . Constipation 08/13/2011  . Dizziness 08/13/2011  . Family history of adverse reaction to anesthesia    pt mother had PONV  . Family history of breast cancer   . Genetic testing 01/28/2018   Multi-Cancer panel (83 genes) @ [facility] - No pathogenic mutations detected  . GERD (gastroesophageal reflux disease)   . Heart murmur    "SLIGHT HEART MURMUR"  WAS PICKED UP AS AN ADULT  . Hyperlipidemia   . Hypertension   . Hypothyroidism   . Osteoarthritis of knee    Right  . Peripheral vascular disease ([place])   . Reflux   . Splenic artery aneurysm ([facility])    stable 1.5cm by 08/2013 CT; no further surveillance recommended  . Weight loss     Past Surgical History:  Procedure Laterality Date  . ABDOMINAL HYSTERECTOMY    . BACK SURGERY    . BREAST LUMPECTOMY WITH RADIOACTIVE SEED LOCALIZATION Right 02/19/2018   Procedure: BREAST LUMPECTOMY  WITH RADIOACTIVE SEED LOCALIZATION. RIGHT;  Surgeon: Alphonsa Overall, MD;  Location: [facility];  Service: General;  Laterality: Right;  . BREAST SURGERY    . CHOLECYSTECTOMY    . KNEE SURGERY    . LOOP RECORDER INSERTION N/A 01/10/2021   Procedure: LOOP RECORDER INSERTION;  Surgeon: Constance Haw, MD;  Location: [facility];  Service: Cardiovascular;  Laterality: N/A;  . LUMBAR LAMINECTOMY    . TOTAL KNEE ARTHROPLASTY Right 01/04/2018  . TOTAL KNEE ARTHROPLASTY Right 01/04/2018   Procedure: TOTAL KNEE ARTHROPLASTY;  Surgeon: Vickey Huger, MD;  Location: [facility];  Service: Orthopedics;  Laterality: Right;  Marland Kitchen VAGINAL PROLAPSE REPAIR     2015   DONE IN [place]  . VAGINAL PROLAPSE REPAIR  05/20/2018    There were no vitals filed for this visit.   Subjective Assessment - 02/22/21 0847    Subjective  "I made spicy chicken the other day" Pt came in asking about only 3 more scheduled appointments and not being ready to return to driving which is her ultimate goal, per report.    Pertinent History PMHx: R breast cancer s/p lumpectomy and HTN.    Limitations Fall Risk. Loop recorder    Patient Stated Goals Pt reported no deficits.  Currently in Pain? No/denies           TREATMENT:  Constant Therapy: Alternating (uppercase/lowercase) alphabetizing words level 1 with 93% accuracy and 112.33s response time. Required increased time - with more distractions, talking in background, people moving around, patient had harder time with solving problem. Alternating Symbols Level 6 with 95% accuracy and 53.62s response time. Improved since last time did this activity.  Blink: playing 5 cards at a time and matching by color, shape or number and getting rid of deck of cards. Pt required mod cues at beginning for novel activity and getting used to procedure. Pt did well with completing task. Pt required cues for remembering the 3 categories - pt would recall 2 but not 3. When at the end, pt required 1  v.c. for solving 2 step problem and sequencing.  Environmental Scanning w/ Cog component: Completed in max distracting environment with obtaining cards in sequential order with 100% accuracy and 1 verbal cue.                     OT Short Term Goals - 02/22/21 0856      OT SHORT TERM GOAL #1   Title Pt will be independent with HEP for grip strengthening RUE 02/21/21    Time 4    Period Weeks    Status On-going    Target Date 02/21/21      OT SHORT TERM GOAL #2   Title Pt will perform environmental scanning with 90% accuracy or greater.    Baseline 80% with all 3 errors on right    Time 4    Period Weeks    Status Achieved   92%     OT SHORT TERM GOAL #3   Title Pt will perform warm meal prep and/or home management tasks with mod I    Time 4    Period Weeks    Status On-going   pt required verbal cues 02/15/21     OT SHORT TERM GOAL #4   Title Pt will improve grip strength in RUE by 5 lbs in order to increase functional use of RUE    Baseline RUE 27.9 lbs, LUE 45.4 lbs    Time 4    Period Weeks    Status Achieved   32.4 lbs 02/19/21            OT Long Term Goals - 02/22/21 0856      OT LONG TERM GOAL #1   Title Pt will be independent with any updated HEP 03/21/21    Time 8    Period Weeks    Status On-going      OT LONG TERM GOAL #2   Title Pt will perform physical and cognitive task simultaneously with 95% accuracy    Time 8    Period Weeks    Status On-going   has increased difficulty in more distracting areas gym>hallway 02/22/2021     OT LONG TERM GOAL #3   Title Pt will return to prior level of functioning for cooking and home management.    Time 8    Period Weeks    Status On-going   needed verbal cues for sequencing     OT LONG TERM GOAL #4   Title Pt will improve grip strength to 35 lbs or greater in RUE    Baseline RUE 27.9 lbs    Time 8    Period Weeks    Status On-going   32.4 lbs  with RUE - 02/19/21     OT LONG TERM GOAL #5    Title --    Time --    Period --    Status --    Target Date --                 Plan - 02/22/21 4098    Clinical Impression Statement Pt with gaining awareness into deficits. Pt reports not ready for driving but that is her ultimate goal.    OT Occupational Profile and History Problem Focused Assessment - Including review of records relating to presenting problem    Occupational performance deficits (Please refer to evaluation for details): IADL's;Leisure;ADL's    Body Structure / Function / Physical Skills ADL;Coordination;IADL;GMC;Dexterity;Decreased knowledge of use of DME;FMC;UE functional use;Vision    Cognitive Skills Attention;Safety Awareness;Perception;Understand    Rehab Potential Good    Clinical Decision Making Limited treatment options, no task modification necessary    Comorbidities Affecting Occupational Performance: None    Modification or Assistance to Complete Evaluation  No modification of tasks or assist necessary to complete eval    OT Frequency 2x / week    OT Duration 4 weeks   added 2x/week for 4 weeks at renewal 02/22/21   OT Treatment/Interventions Self-care/ADL training;DME and/or AE instruction;Passive range of motion;Therapeutic activities;Functional Mobility Training;Neuromuscular education;Patient/family education;Visual/perceptual remediation/compensation;Therapeutic exercise;Cognitive remediation/compensation    Plan divided attention with environmental scanning, increased freq to 2x/week for next 4 weeks - recert completed to accomdate freq    Consulted and Agree with Plan of Care Patient;Family member/caregiver    Family Member Consulted son, [name]           Patient will benefit from skilled therapeutic intervention in order to improve the following deficits and impairments:   Body Structure / Function / Physical Skills: ADL,Coordination,IADL,GMC,Dexterity,Decreased knowledge of use of DME,FMC,UE functional use,Vision Cognitive Skills:  Attention,Safety Awareness,Perception,Understand     Visit Diagnosis: Visuospatial deficit - Plan: Ot plan of care cert/re-cert  Unsteadiness on feet - Plan: Ot plan of care cert/re-cert  Attention and concentration deficit - Plan: Ot plan of care cert/re-cert  Other lack of coordination - Plan: Ot plan of care cert/re-cert  Frontal lobe and executive function deficit - Plan: Ot plan of care cert/re-cert  Hemiplegia and hemiparesis following other cerebrovascular disease affecting right non-dominant side ([place]) - Plan: Ot plan of care cert/re-cert    Problem List Patient Active Problem List   Diagnosis Date Noted  . Normocytic anemia 01/08/2021  . Acute CVA (cerebrovascular accident) ([place]) 01/07/2021  . Pre-diabetes 01/07/2021  . Essential hypertension 01/06/2021  . Hyperlipidemia 01/06/2021  . Hypothyroidism 01/06/2021  . Genetic testing 01/28/2018  . S/P total knee replacement 01/04/2018  . Family history of breast cancer   . 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 Aneurysm of splenic artery ([place]) 08/23/2012    Zachery Conch MOT, OTR/L  02/22/2021, 11:04 AM  [facility] [address] [place], [place], [address] Phone: [phone]   Fax:  [phone]  Name: Crystal Trevino MRN: [id number] Date of Birth: [birth year]

## 2021-02-27 ENCOUNTER — Ambulatory Visit: Payer: Federal, State, Local not specified - PPO | Admitting: Physical Therapy

## 2021-02-27 ENCOUNTER — Encounter: Payer: Federal, State, Local not specified - PPO | Admitting: Speech Pathology

## 2021-02-27 ENCOUNTER — Ambulatory Visit: Payer: Federal, State, Local not specified - PPO | Admitting: Occupational Therapy

## 2021-03-01 ENCOUNTER — Encounter: Payer: Self-pay | Admitting: Occupational Therapy

## 2021-03-01 ENCOUNTER — Other Ambulatory Visit: Payer: Self-pay

## 2021-03-01 ENCOUNTER — Ambulatory Visit: Payer: Federal, State, Local not specified - PPO | Attending: Internal Medicine | Admitting: Occupational Therapy

## 2021-03-01 DIAGNOSIS — R278 Other lack of coordination: Secondary | ICD-10-CM

## 2021-03-01 DIAGNOSIS — M6281 Muscle weakness (generalized): Secondary | ICD-10-CM | POA: Diagnosis present

## 2021-03-01 DIAGNOSIS — R41844 Frontal lobe and executive function deficit: Secondary | ICD-10-CM

## 2021-03-01 DIAGNOSIS — R2681 Unsteadiness on feet: Secondary | ICD-10-CM | POA: Insufficient documentation

## 2021-03-01 DIAGNOSIS — R41842 Visuospatial deficit: Secondary | ICD-10-CM | POA: Diagnosis present

## 2021-03-01 DIAGNOSIS — I69853 Hemiplegia and hemiparesis following other cerebrovascular disease affecting right non-dominant side: Secondary | ICD-10-CM | POA: Diagnosis present

## 2021-03-01 DIAGNOSIS — R41840 Attention and concentration deficit: Secondary | ICD-10-CM | POA: Diagnosis present

## 2021-03-01 NOTE — Therapy (Signed)
[facility] [address] [place], [place], [address] Phone: [phone]   Fax:  [phone]  Occupational Therapy Treatment  Patient Details  Name: Crystal Trevino MRN: [id number] Date of Birth: 05-04-1941 Referring Provider (OT): Leonides Schanz [place] ([place])   Encounter Date: 03/01/2021   OT End of Session - 03/01/21 0802    Visit Number 10    Number of Visits 17   adjusted at renewal   Date for OT Re-Evaluation 03/29/21    Authorization Type BCBS Federal    Authorization Time Period 75 PT/OT/ST $25 copay (week 5 of 8)    Authorization - Visit Number 10    Authorization - Number of Visits 25    OT Start Time 0801    OT Stop Time 0844    OT Time Calculation (min) 43 min    Activity Tolerance Patient tolerated treatment well    Behavior During Therapy [facility] for tasks assessed/performed           Past Medical History:  Diagnosis Date  . Arthritis   . Breast CA ([place])    right breast cancer  . Cancer ([place])    HAD RIGHT BREAST BX --[facility]ING [facility] 12/30/2017  . Constipation 08/13/2011  . Dizziness 08/13/2011  . Family history of adverse reaction to anesthesia    pt mother had PONV  . Family history of breast cancer   . Genetic testing 01/28/2018   Multi-Cancer panel (83 genes) @ [facility] - No pathogenic mutations detected  . GERD (gastroesophageal reflux disease)   . Heart murmur    "SLIGHT HEART MURMUR"  WAS PICKED UP AS AN ADULT  . Hyperlipidemia   . Hypertension   . Hypothyroidism   . Osteoarthritis of knee    Right  . Peripheral vascular disease ([place])   . Reflux   . Splenic artery aneurysm ([facility])    stable 1.5cm by 08/2013 CT; no further surveillance recommended  . Weight loss     Past Surgical History:  Procedure Laterality Date  . ABDOMINAL HYSTERECTOMY    . BACK SURGERY    . BREAST LUMPECTOMY WITH RADIOACTIVE SEED LOCALIZATION Right 02/19/2018   Procedure: BREAST LUMPECTOMY WITH RADIOACTIVE  SEED LOCALIZATION. RIGHT;  Surgeon: Alphonsa Overall, MD;  Location: [facility];  Service: General;  Laterality: Right;  . BREAST SURGERY    . CHOLECYSTECTOMY    . KNEE SURGERY    . LOOP RECORDER INSERTION N/A 01/10/2021   Procedure: LOOP RECORDER INSERTION;  Surgeon: Constance Haw, MD;  Location: [facility];  Service: Cardiovascular;  Laterality: N/A;  . LUMBAR LAMINECTOMY    . TOTAL KNEE ARTHROPLASTY Right 01/04/2018  . TOTAL KNEE ARTHROPLASTY Right 01/04/2018   Procedure: TOTAL KNEE ARTHROPLASTY;  Surgeon: Vickey Huger, MD;  Location: [facility];  Service: Orthopedics;  Laterality: Right;  Marland Kitchen VAGINAL PROLAPSE REPAIR     2015   DONE IN [place]  . VAGINAL PROLAPSE REPAIR  05/20/2018    There were no vitals filed for this visit.   Subjective Assessment - 03/01/21 0807    Subjective  Pt came in with concerns about return to driving. Pt denies any pain.    Patient is accompanied by: Family member   daughter   Pertinent History PMHx: R breast cancer s/p lumpectomy and HTN.    Limitations Fall Risk. Loop recorder    Patient Stated Goals Pt reported no deficits.    Currently in Pain? No/denies  TREATMENT:  Hand Gripper:  RUE level 3 with silver spring picking up 1 inch blocks. Mod difficulty with task.   PVC pipe tree: Fig 10 with 1 error. Pt able to identify with 1 verbal cue. Fig 16 with 100% accuracy. Pt with much improvement with this task and demonstrated improved right attention.  Deductive Reasoning: Air traffic controller (store owners) with min verbal cues for problem solving when solution was not clearly evident with clues.  Physical/Cognitive: tossing ball and word/category generation for typical female names A-Z with grossly 90% accuracy. Pt with increased time and difficulty with sequencing ABCs in more distracting environments and people talking around her.                      OT Education - 03/01/21 0807    Education Details issued driver  evaluation information.    Person(s) Educated Patient   family (son?)   Methods Explanation;Handout    Comprehension Verbalized understanding            OT Short Term Goals - 02/22/21 0856      OT SHORT TERM GOAL #1   Title Pt will be independent with HEP for grip strengthening RUE 02/21/21    Time 4    Period Weeks    Status On-going    Target Date 02/21/21      OT SHORT TERM GOAL #2   Title Pt will perform environmental scanning with 90% accuracy or greater.    Baseline 80% with all 3 errors on right    Time 4    Period Weeks    Status Achieved   92%     OT SHORT TERM GOAL #3   Title Pt will perform warm meal prep and/or home management tasks with mod I    Time 4    Period Weeks    Status On-going   pt required verbal cues 02/15/21     OT SHORT TERM GOAL #4   Title Pt will improve grip strength in RUE by 5 lbs in order to increase functional use of RUE    Baseline RUE 27.9 lbs, LUE 45.4 lbs    Time 4    Period Weeks    Status Achieved   32.4 lbs 02/19/21            OT Long Term Goals - 02/22/21 0856      OT LONG TERM GOAL #1   Title Pt will be independent with any updated HEP 03/21/21    Time 8    Period Weeks    Status On-going      OT LONG TERM GOAL #2   Title Pt will perform physical and cognitive task simultaneously with 95% accuracy    Time 8    Period Weeks    Status On-going   has increased difficulty in more distracting areas gym>hallway 02/22/2021     OT LONG TERM GOAL #3   Title Pt will return to prior level of functioning for cooking and home management.    Time 8    Period Weeks    Status On-going   needed verbal cues for sequencing     OT LONG TERM GOAL #4   Title Pt will improve grip strength to 35 lbs or greater in RUE    Baseline RUE 27.9 lbs    Time 8    Period Weeks    Status On-going   32.4 lbs with RUE - 02/19/21     OT  LONG TERM GOAL #5   Title --    Time --    Period --    Status --    Target Date --                  Plan - 03/01/21 9563    Clinical Impression Statement Pt came in frustrated today with not being able to return to driving. Pt with increased skills with attention this day and with completing low problem solving tasks. Minimal distractions in gym.    OT Occupational Profile and History Problem Focused Assessment - Including review of records relating to presenting problem    Occupational performance deficits (Please refer to evaluation for details): IADL's;Leisure;ADL's    Body Structure / Function / Physical Skills ADL;Coordination;IADL;GMC;Dexterity;Decreased knowledge of use of DME;FMC;UE functional use;Vision    Cognitive Skills Attention;Safety Awareness;Perception;Understand    Rehab Potential Good    Clinical Decision Making Limited treatment options, no task modification necessary    Comorbidities Affecting Occupational Performance: None    Modification or Assistance to Complete Evaluation  No modification of tasks or assist necessary to complete eval    OT Frequency 2x / week    OT Duration 4 weeks   added 2x/week for 4 weeks at renewal 02/22/21   OT Treatment/Interventions Self-care/ADL training;DME and/or AE instruction;Passive range of motion;Therapeutic activities;Functional Mobility Training;Neuromuscular education;Patient/family education;Visual/perceptual remediation/compensation;Therapeutic exercise;Cognitive remediation/compensation    Plan divided attention with environmental scanning, increased freq to 2x/week for next 4 weeks - recert completed to accomdate freq    Consulted and Agree with Plan of Care Patient;Family member/caregiver    Family Member Consulted son, [name]           Patient will benefit from skilled therapeutic intervention in order to improve the following deficits and impairments:   Body Structure / Function / Physical Skills: ADL,Coordination,IADL,GMC,Dexterity,Decreased knowledge of use of DME,FMC,UE functional use,Vision Cognitive  Skills: Attention,Safety Awareness,Perception,Understand     Visit Diagnosis: Visuospatial deficit  Unsteadiness on feet  Attention and concentration deficit  Other lack of coordination  Frontal lobe and executive function deficit  Hemiplegia and hemiparesis following other cerebrovascular disease affecting right non-dominant side [facility])    Problem List Patient Active Problem List   Diagnosis Date Noted  . Normocytic anemia 01/08/2021  . Acute CVA (cerebrovascular accident) ([place]) 01/07/2021  . Pre-diabetes 01/07/2021  . Essential hypertension 01/06/2021  . Hyperlipidemia 01/06/2021  . Hypothyroidism 01/06/2021  . Genetic testing 01/28/2018  . S/P total knee replacement 01/04/2018  . Family history of breast cancer   . 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 Aneurysm of splenic artery ([facility]) 08/23/2012    Zachery Conch MOT, OTR/L  03/01/2021, 9:29 AM  [facility] [address] [place], [place], [address] Phone: [phone]   Fax:  [phone]  Name: Crystal Trevino MRN: [id number] Date of Birth: 02-01-1941

## 2021-03-01 NOTE — Patient Instructions (Signed)
Local Driver Evaluation Programs: ° °Comprehensive Evaluation: includes clinical and in vehicle behind the wheel testing by OCCUPATIONAL THERAPIST. Programs have varying levels of adaptive controls available for trial.  ° °[facility] °[address] °[place], [place]  [address] °[phone] or [phone] °http://www.driver-rehab.com °Evaluator:  Cyndee Crompton, OT/CDRS/CDI/SCDCM/Low Vision Certification ° °[facility] °[address] °[place], [place] [address] °[phone] °https://www.[facility].org/home/services/rehabilitation.aspx °Evaluators:  Shannon Sheek, OT and Jill Tucker, OT ° °[facility] [place] [facility] (ONLY SERVES VETERANS!!) °[facility] °[address] °[place], [place]  [address] °[phone] °http://www.[place].va.gov/services/Physical_Medicine_Rehabilitation_Services.asp °Evaluators:  Eric Andrews, KT; Heidi Harris, KT;  Gary Whitaker, KT (KT=kiniesotherapist) ° ° °Clinical evaluations only:  Includes clinical testing, refers to other programs or local certified driving instructor for behind the wheel testing. ° °[facility] (outpatient Rehab) °[facility] °[address] °[place], [place] [address] °[phone] for scheduling °http://www.[facility].edu/Outpatient-Rehabilitation/Neurorehabilitation-Therapy.htm °Evaluators:  Kelly Lambeth, OT; Kate Phillips, OT ° °Other area clinical evaluators available upon request including [facility], [facility] and [facility]. ° ° °    Resource List °What is a Driver Evaluation: °Your Road Ahead - A Guide to Comprehensive Driving Evaluations °http://www.thehartford.com/resources/mature-market-excellence/publications-on-aging ° °Association for [employer] - Disability and Driving Fact Sheets °http://www.aded.net/?page=510 ° °Driving after a Brain  Injury: °[employer] °http://www.biausa.org/tbims-abstracts/if-there-is-an-effective-way-to-determine-if-someone-is-ready-to-drive-after-tbi?A=SearchResult&SearchID=9495675&ObjectID=2758842&ObjectType=35 ° °Driving with Adaptive Equipment: °[facility] Process °http://www.driver-rehab.com/adaptive-equipment ° °[employer] °http://www.nmeda.com/ ° ° ° ° ° ° °  °

## 2021-03-06 ENCOUNTER — Other Ambulatory Visit: Payer: Self-pay

## 2021-03-06 ENCOUNTER — Encounter: Payer: Federal, State, Local not specified - PPO | Admitting: Speech Pathology

## 2021-03-06 ENCOUNTER — Ambulatory Visit: Payer: Federal, State, Local not specified - PPO | Admitting: Physical Therapy

## 2021-03-06 ENCOUNTER — Ambulatory Visit: Payer: Federal, State, Local not specified - PPO | Admitting: Occupational Therapy

## 2021-03-06 DIAGNOSIS — R41844 Frontal lobe and executive function deficit: Secondary | ICD-10-CM

## 2021-03-06 DIAGNOSIS — R41842 Visuospatial deficit: Secondary | ICD-10-CM | POA: Diagnosis not present

## 2021-03-06 DIAGNOSIS — R41840 Attention and concentration deficit: Secondary | ICD-10-CM

## 2021-03-06 NOTE — Therapy (Signed)
[facility] [address] [place], [place], [address] Phone: [phone]   Fax:  [phone]  Occupational Therapy Treatment  Patient Details  Name: Crystal Trevino MRN: [id number] Date of Birth: 07-01-1941 Referring Provider (OT): Leonides Schanz [place] ([place])   Encounter Date: 03/06/2021   OT End of Session - 03/06/21 0849    Visit Number 11    Number of Visits 17   adjusted at renewal   Date for OT Re-Evaluation 03/29/21    Authorization Type BCBS Federal    Authorization Time Period 75 PT/OT/ST $25 copay (week 5 of 8)    Authorization - Visit Number 11    Authorization - Number of Visits 25    OT Start Time 0800    OT Stop Time 0850    OT Time Calculation (min) 50 min    Activity Tolerance Patient tolerated treatment well    Behavior During Therapy [facility] for tasks assessed/performed           Past Medical History:  Diagnosis Date  . Arthritis   . Breast CA ([place])    right breast cancer  . Cancer ([place])    HAD RIGHT BREAST BX --GOING [facility]ED 12/30/2017  . Constipation 08/13/2011  . Dizziness 08/13/2011  . Family history of adverse reaction to anesthesia    pt mother had PONV  . Family history of breast cancer   . Genetic testing 01/28/2018   Multi-Cancer panel (83 genes) @ [facility] - No pathogenic mutations detected  . GERD (gastroesophageal reflux disease)   . Heart murmur    "SLIGHT HEART MURMUR"  WAS PICKED UP AS AN ADULT  . Hyperlipidemia   . Hypertension   . Hypothyroidism   . Osteoarthritis of knee    Right  . Peripheral vascular disease ([place])   . Reflux   . Splenic artery aneurysm (H[facility])    stable 1.5cm by 08/2013 CT; no further surveillance recommended  . Weight loss     Past Surgical History:  Procedure Laterality Date  . ABDOMINAL HYSTERECTOMY    . BACK SURGERY    . BREAST LUMPECTOMY WITH RADIOACTIVE SEED LOCALIZATION Right 02/19/2018   Procedure: BREAST LUMPECTOMY WITH RADIOACTIVE  SEED LOCALIZATION. RIGHT;  Surgeon: Alphonsa Overall, MD;  Location: [facility];  Service: General;  Laterality: Right;  . BREAST SURGERY    . CHOLECYSTECTOMY    . KNEE SURGERY    . LOOP RECORDER INSERTION N/A 01/10/2021   Procedure: LOOP RECORDER INSERTION;  Surgeon: Constance Haw, MD;  Location: [facility];  Service: Cardiovascular;  Laterality: N/A;  . LUMBAR LAMINECTOMY    . TOTAL KNEE ARTHROPLASTY Right 01/04/2018  . TOTAL KNEE ARTHROPLASTY Right 01/04/2018   Procedure: TOTAL KNEE ARTHROPLASTY;  Surgeon: Vickey Huger, MD;  Location: [facility];  Service: Orthopedics;  Laterality: Right;  Marland Kitchen VAGINAL PROLAPSE REPAIR     2015   DONE IN [place]  . VAGINAL PROLAPSE REPAIR  05/20/2018    There were no vitals filed for this visit.   Subjective Assessment - 03/06/21 0802    Subjective  Pt came in with concerns about return to driving. Pt denies any pain.    Patient is accompanied by: Family member   daughter   Pertinent History Lt frontal and Lt parietal lobe w/ distal M3 occlusion. PMHx: R breast cancer s/p lumpectomy and HTN.    Limitations Fall Risk. Loop recorder    Patient Stated Goals Pt reported no deficits.  Currently in Pain? No/denies                        OT Treatments/Exercises (OP) - 03/06/21 0001      ADLs   ADL Comments Pt's main goal is to drive and she had questions if she needed more therapy (OT) and how coming here would get her to her goal. Therapist explained our limitations in simulating driving in this clinic and that MD would ultimately have to clear her to drive. However, recommended exploring driving evaluation (info given last session) and discussing further w/ primary O.T.      Cognitive Exercises   Other Cognitive Exercises 1 Map reading activity on constant therapy: level 1 at 100% accuracy w/ extra time, mod difficulty and 1 cue; level 2 at 60% accuracy and extra time      Visual/Perceptual Exercises   Scanning - Environmental Pt found  11/15 items (73% accuracy) in fairly quiet gym environment. Missing all 4 items on Rt side (2 lower, 2 eye level). Pt found 3/4 items on 2nd pass. Pt was tossing ball during task but often would stop tossing ball when having difficulty finding items - difficulty w/ divided attention and decreased attention/awareness to Rt side                    OT Short Term Goals - 03/06/21 0852      OT SHORT TERM GOAL #1   Title Pt will be independent with HEP for grip strengthening RUE 02/21/21    Time 4    Period Weeks    Status Achieved    Target Date 02/21/21      OT SHORT TERM GOAL #2   Title Pt will perform environmental scanning with 90% accuracy or greater.    Baseline 80% with all 3 errors on right    Time 4    Period Weeks    Status Achieved   92%     OT SHORT TERM GOAL #3   Title Pt will perform warm meal prep and/or home management tasks with mod I    Time 4    Period Weeks    Status Partially Met   pt required verbal cues 02/15/21, however pt reports doing at home     OT SHORT TERM GOAL #4   Title Pt will improve grip strength in RUE by 5 lbs in order to increase functional use of RUE    Baseline RUE 27.9 lbs, LUE 45.4 lbs    Time 4    Period Weeks    Status Achieved   32.4 lbs 02/19/21            OT Long Term Goals - 03/06/21 0853      OT LONG TERM GOAL #1   Title Pt will be independent with any updated HEP 03/21/21    Time 8    Period Weeks    Status Deferred      OT LONG TERM GOAL #2   Title Pt will perform physical and cognitive task simultaneously with 95% accuracy    Time 8    Period Weeks    Status On-going   has increased difficulty in more distracting areas gym>hallway 02/22/2021.     OT LONG TERM GOAL #3   Title Pt will return to prior level of functioning for cooking and home management.    Time 8    Period Weeks    Status On-going  needed verbal cues for sequencing     OT LONG TERM GOAL #4   Title Pt will improve grip strength to 35 lbs or  greater in RUE    Baseline RUE 27.9 lbs    Time 8    Period Weeks    Status Achieved   32.4 lbs with RUE - 02/19/21;  37 lbs 03/06/21                Plan - 03/06/21 0855    Clinical Impression Statement Pt continues w/ frustrations re: driving. Advised to discuss further w/ primary O.T. but did recommend driving evaluation secondary to concerns w/ missed items on Rt side w/ environmental scanning and overall decreased alternating and divided attention    OT Occupational Profile and History Problem Focused Assessment - Including review of records relating to presenting problem    Occupational performance deficits (Please refer to evaluation for details): IADL's;Leisure;ADL's    Body Structure / Function / Physical Skills ADL;Coordination;IADL;GMC;Dexterity;Decreased knowledge of use of DME;FMC;UE functional use;Vision    Cognitive Skills Attention;Safety Awareness;Perception;Understand    Rehab Potential Good    Clinical Decision Making Limited treatment options, no task modification necessary    Comorbidities Affecting Occupational Performance: None    Modification or Assistance to Complete Evaluation  No modification of tasks or assist necessary to complete eval    OT Frequency 2x / week    OT Duration 4 weeks   added 2x/week for 4 weeks at renewal 02/22/21   OT Treatment/Interventions Self-care/ADL training;DME and/or AE instruction;Passive range of motion;Therapeutic activities;Functional Mobility Training;Neuromuscular education;Patient/family education;Visual/perceptual remediation/compensation;Therapeutic exercise;Cognitive remediation/compensation    Plan Pt/primary OT to discuss if remaining appointments for OT needed - pt's only goal at this time is driving. Pt could benefit from further O.T. but pt has difficulty correlating tasks in clinic to driving, as well as clinic limited in simulated driving tasks.    Consulted and Agree with Plan of Care Patient;Family member/caregiver     Family Member Consulted son, [name]           Patient will benefit from skilled therapeutic intervention in order to improve the following deficits and impairments:   Body Structure / Function / Physical Skills: ADL,Coordination,IADL,GMC,Dexterity,Decreased knowledge of use of DME,FMC,UE functional use,Vision Cognitive Skills: Attention,Safety Awareness,Perception,Understand     Visit Diagnosis: Visuospatial deficit  Attention and concentration deficit  Frontal lobe and executive function deficit    Problem List Patient Active Problem List   Diagnosis Date Noted  . Normocytic anemia 01/08/2021  . Acute CVA (cerebrovascular accident) ([place]) 01/07/2021  . Pre-diabetes 01/07/2021  . Essential hypertension 01/06/2021  . Hyperlipidemia 01/06/2021  . Hypothyroidism 01/06/2021  . Genetic testing 01/28/2018  . S/P total knee replacement 01/04/2018  . Family history of breast cancer   . 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 Aneurysm of splenic artery (HCC) 08/23/2012    Carey Bullocks, OTR/L 03/06/2021, 9:08 AM  [facility] [address] [place], [place], [address] Phone: [phone]   Fax:  [phone]  Name: Crystal Trevino MRN: [id number] Date of Birth: 1941-03-23

## 2021-03-08 ENCOUNTER — Other Ambulatory Visit: Payer: Self-pay

## 2021-03-08 ENCOUNTER — Ambulatory Visit: Payer: Federal, State, Local not specified - PPO | Admitting: Occupational Therapy

## 2021-03-08 ENCOUNTER — Encounter: Payer: Self-pay | Admitting: Occupational Therapy

## 2021-03-08 DIAGNOSIS — R41844 Frontal lobe and executive function deficit: Secondary | ICD-10-CM

## 2021-03-08 DIAGNOSIS — R41840 Attention and concentration deficit: Secondary | ICD-10-CM

## 2021-03-08 DIAGNOSIS — R41842 Visuospatial deficit: Secondary | ICD-10-CM | POA: Diagnosis not present

## 2021-03-08 DIAGNOSIS — R278 Other lack of coordination: Secondary | ICD-10-CM

## 2021-03-08 DIAGNOSIS — R2681 Unsteadiness on feet: Secondary | ICD-10-CM

## 2021-03-08 DIAGNOSIS — I69853 Hemiplegia and hemiparesis following other cerebrovascular disease affecting right non-dominant side: Secondary | ICD-10-CM

## 2021-03-08 NOTE — Therapy (Signed)
[facility] [address] [place], [place], [address] Phone: [phone]   Fax:  [phone]  Occupational Therapy Treatment  Patient Details  Name: Crystal Trevino MRN: [id number] Date of Birth: [birth year] Referring Provider (OT): Leonides Schanz [place] ([place])   Encounter Date: 03/08/2021   OT End of Session - 03/08/21 0804    Visit Number 12    Number of Visits 17   adjusted at renewal   Date for OT Re-Evaluation 03/29/21    Authorization Type BCBS Federal    Authorization Time Period 75 PT/OT/ST $25 copay (week 5 of 8)    Authorization - Visit Number 12    Authorization - Number of Visits 25    OT Start Time 0800    OT Stop Time 0845    OT Time Calculation (min) 45 min    Activity Tolerance Patient tolerated treatment well    Behavior During Therapy [facility] for tasks assessed/performed           Past Medical History:  Diagnosis Date  . Arthritis   . Breast CA ([place])    right breast cancer  . Cancer ([place])    HAD RIGHT BREAST BX --GOING [facility] 12/30/2017  . Constipation 08/13/2011  . Dizziness 08/13/2011  . Family history of adverse reaction to anesthesia    pt mother had PONV  . Family history of breast cancer   . Genetic testing 01/28/2018   Multi-Cancer panel (83 genes) @ [facility] - No pathogenic mutations detected  . GERD (gastroesophageal reflux disease)   . Heart murmur    "SLIGHT HEART MURMUR"  WAS PICKED UP AS AN ADULT  . Hyperlipidemia   . Hypertension   . Hypothyroidism   . Osteoarthritis of knee    Right  . Peripheral vascular disease ([place])   . Reflux   . Splenic artery aneurysm ([facility])    stable 1.5cm by 08/2013 CT; no further surveillance recommended  . Weight loss     Past Surgical History:  Procedure Laterality Date  . ABDOMINAL HYSTERECTOMY    . BACK SURGERY    . BREAST LUMPECTOMY WITH RADIOACTIVE SEED LOCALIZATION Right 02/19/2018   Procedure: BREAST LUMPECTOMY WITH RADIOACTIVE  SEED LOCALIZATION. RIGHT;  Surgeon: Alphonsa Overall, MD;  Location: [facility];  Service: General;  Laterality: Right;  . BREAST SURGERY    . CHOLECYSTECTOMY    . KNEE SURGERY    . LOOP RECORDER INSERTION N/A 01/10/2021   Procedure: LOOP RECORDER INSERTION;  Surgeon: Constance Haw, MD;  Location: [facility];  Service: Cardiovascular;  Laterality: N/A;  . LUMBAR LAMINECTOMY    . TOTAL KNEE ARTHROPLASTY Right 01/04/2018  . TOTAL KNEE ARTHROPLASTY Right 01/04/2018   Procedure: TOTAL KNEE ARTHROPLASTY;  Surgeon: Vickey Huger, MD;  Location: [facility];  Service: Orthopedics;  Laterality: Right;  Marland Kitchen VAGINAL PROLAPSE REPAIR     2015   DONE IN [place]  . VAGINAL PROLAPSE REPAIR  05/20/2018    There were no vitals filed for this visit.   Subjective Assessment - 03/08/21 0758    Subjective  Pt came in with concerns about return to driving. Pt denies any pain.    Patient is accompanied by: Family member   daughter   Pertinent History Lt frontal and Lt parietal lobe w/ distal M3 occlusion. PMHx: R breast cancer s/p lumpectomy and HTN.    Limitations Fall Risk. Loop recorder    Patient Stated Goals Pt reported no deficits.  TREATMENT:  Driving extensive conversation with patient and house mate regarding back to driving. At this time, therapist observing increased difficulty with dual tasking with physical and cognitive tasks and decreased processing speed. This was discussed with house mate and pt. Pt continues to have limited self-awareness into deficits preventing carry over and understanding and correlating deficits with driving. Pt continues to miss items on the right with environmental scanning. OT continues to recommend formal driving evaluation after 3 months post stroke. OT recommends continuing puzzles, Bop It, problem solving games, etc at home.  Constant Therapy Reading a Map level 1 with 80% accuracy and 47.17s response time. Alternating Symbols level 6 with 97% accuracy  and 53.72s response time.  Physical/Cognitive Task with max difficulty with subtracting numbers (3) while walking around hallway this day.   IQFit with placing pieces in to match model with min A and cues for spatial orientation                   OT Short Term Goals - 03/06/21 0852      OT SHORT TERM GOAL #1   Title Pt will be independent with HEP for grip strengthening RUE 02/21/21    Time 4    Period Weeks    Status Achieved    Target Date 02/21/21      OT SHORT TERM GOAL #2   Title Pt will perform environmental scanning with 90% accuracy or greater.    Baseline 80% with all 3 errors on right    Time 4    Period Weeks    Status Achieved   92%     OT SHORT TERM GOAL #3   Title Pt will perform warm meal prep and/or home management tasks with mod I    Time 4    Period Weeks    Status Partially Met   pt required verbal cues 02/15/21, however pt reports doing at home     OT SHORT TERM GOAL #4   Title Pt will improve grip strength in RUE by 5 lbs in order to increase functional use of RUE    Baseline RUE 27.9 lbs, LUE 45.4 lbs    Time 4    Period Weeks    Status Achieved   32.4 lbs 02/19/21            OT Long Term Goals - 03/06/21 0853      OT LONG TERM GOAL #1   Title Pt will be independent with any updated HEP 03/21/21    Time 8    Period Weeks    Status Deferred      OT LONG TERM GOAL #2   Title Pt will perform physical and cognitive task simultaneously with 95% accuracy    Time 8    Period Weeks    Status On-going   has increased difficulty in more distracting areas gym>hallway 02/22/2021.     OT LONG TERM GOAL #3   Title Pt will return to prior level of functioning for cooking and home management.    Time 8    Period Weeks    Status On-going   needed verbal cues for sequencing     OT LONG TERM GOAL #4   Title Pt will improve grip strength to 35 lbs or greater in RUE    Baseline RUE 27.9 lbs    Time 8    Period Weeks    Status Achieved    32.4 lbs with RUE - 02/19/21;  37  lbs 03/06/21                Plan - 03/08/21 0824    Clinical Impression Statement Continue to advise driver evaluation after 3 months post CVA. Pt continues to have limited awareness into deficits.    OT Occupational Profile and History Problem Focused Assessment - Including review of records relating to presenting problem    Occupational performance deficits (Please refer to evaluation for details): IADL's;Leisure;ADL's    Body Structure / Function / Physical Skills ADL;Coordination;IADL;GMC;Dexterity;Decreased knowledge of use of DME;FMC;UE functional use;Vision    Cognitive Skills Attention;Safety Awareness;Perception;Understand    Rehab Potential Good    Clinical Decision Making Limited treatment options, no task modification necessary    Comorbidities Affecting Occupational Performance: None    Modification or Assistance to Complete Evaluation  No modification of tasks or assist necessary to complete eval    OT Frequency 2x / week    OT Duration 4 weeks   added 2x/week for 4 weeks at renewal 02/22/21   OT Treatment/Interventions Self-care/ADL training;DME and/or AE instruction;Passive range of motion;Therapeutic activities;Functional Mobility Training;Neuromuscular education;Patient/family education;Visual/perceptual remediation/compensation;Therapeutic exercise;Cognitive remediation/compensation    Plan Bop It, Problem Solving, dual tasking, right attention tasks    Consulted and Agree with Plan of Care Patient;Family member/caregiver    Family Member Consulted house mate           Patient will benefit from skilled therapeutic intervention in order to improve the following deficits and impairments:   Body Structure / Function / Physical Skills: ADL,Coordination,IADL,GMC,Dexterity,Decreased knowledge of use of DME,FMC,UE functional use,Vision Cognitive Skills: Attention,Safety Awareness,Perception,Understand     Visit Diagnosis: Visuospatial  deficit  Attention and concentration deficit  Frontal lobe and executive function deficit  Unsteadiness on feet  Other lack of coordination  Hemiplegia and 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 Normocytic anemia 01/08/2021  . Acute CVA (cerebrovascular accident) ([place]) 01/07/2021  . Pre-diabetes 01/07/2021  . Essential hypertension 01/06/2021  . Hyperlipidemia 01/06/2021  . Hypothyroidism 01/06/2021  . Genetic testing 01/28/2018  . S/P total knee replacement 01/04/2018  . Family history of breast cancer   . 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 Aneurysm of splenic artery ([facility]) 08/23/2012    Zachery Conch MOT, OTR/L  03/08/2021, 8:26 AM  [facility] [address] [place], [place], [address] Phone: [phone]   Fax:  [phone]  Name: DAYSHA ASHMORE MRN: [id number] Date of Birth: 04/20/1941

## 2021-03-11 ENCOUNTER — Other Ambulatory Visit: Payer: Self-pay

## 2021-03-11 ENCOUNTER — Ambulatory Visit: Payer: Federal, State, Local not specified - PPO | Admitting: Occupational Therapy

## 2021-03-11 DIAGNOSIS — R41840 Attention and concentration deficit: Secondary | ICD-10-CM

## 2021-03-11 DIAGNOSIS — R41842 Visuospatial deficit: Secondary | ICD-10-CM | POA: Diagnosis not present

## 2021-03-11 DIAGNOSIS — R41844 Frontal lobe and executive function deficit: Secondary | ICD-10-CM

## 2021-03-11 NOTE — Therapy (Signed)
[facility] [address] [place], [place], [address] Phone: [phone]   Fax:  [phone]  Occupational Therapy Treatment  Patient Details  Name: Crystal Trevino MRN: [id number] Date of Birth: [birth year] Referring Provider (OT): Leonides Schanz [place] ([place])   Encounter Date: 03/11/2021   OT End of Session - 03/11/21 0806    Visit Number 13    Number of Visits 17   adjusted at renewal   Date for OT Re-Evaluation 03/29/21    Authorization Type BCBS Federal    Authorization Time Period 36 PT/OT/ST $25 copay (week 5 of 8)    Authorization - Visit Number 13    Authorization - Number of Visits 25    OT Start Time 0800    OT Stop Time 0841    OT Time Calculation (min) 41 min    Activity Tolerance Patient tolerated treatment well    Behavior During Therapy [facility] for tasks assessed/performed           Past Medical History:  Diagnosis Date  . Arthritis   . Breast CA ([place])    right breast cancer  . Cancer ([place])    HAD RIGHT BREAST BX --GOING TO [facility] 12/30/2017  . Constipation 08/13/2011  . Dizziness 08/13/2011  . Family history of adverse reaction to anesthesia    pt mother had PONV  . Family history of breast cancer   . Genetic testing 01/28/2018   Multi-Cancer panel (83 genes) @ [facility] - No pathogenic mutations detected  . GERD (gastroesophageal reflux disease)   . Heart murmur    "SLIGHT HEART MURMUR"  WAS PICKED UP AS AN ADULT  . Hyperlipidemia   . Hypertension   . Hypothyroidism   . Osteoarthritis of knee    Right  . Peripheral vascular disease ([place])   . Reflux   . Splenic artery aneurysm (HCC)    stable 1.5cm by 08/2013 CT; no further surveillance recommended  . Weight loss     Past Surgical History:  Procedure Laterality Date  . ABDOMINAL HYSTERECTOMY    . BACK SURGERY    . BREAST LUMPECTOMY WITH RADIOACTIVE SEED LOCALIZATION Right 02/19/2018   Procedure: BREAST LUMPECTOMY WITH  RADIOACTIVE SEED LOCALIZATION. RIGHT;  Surgeon: Alphonsa Overall, MD;  Location: [facility] [place] [facility];  Service: General;  Laterality: Right;  . BREAST SURGERY    . CHOLECYSTECTOMY    . KNEE SURGERY    . LOOP RECORDER INSERTION N/A 01/10/2021   Procedure: LOOP RECORDER INSERTION;  Surgeon: Constance Haw, MD;  Location: [facility];  Service: Cardiovascular;  Laterality: N/A;  . LUMBAR LAMINECTOMY    . TOTAL KNEE ARTHROPLASTY Right 01/04/2018  . TOTAL KNEE ARTHROPLASTY Right 01/04/2018   Procedure: TOTAL KNEE ARTHROPLASTY;  Surgeon: Vickey Huger, MD;  Location: [facility];  Service: Orthopedics;  Laterality: Right;  Marland Kitchen VAGINAL PROLAPSE REPAIR     2015   DONE IN [place]  . VAGINAL PROLAPSE REPAIR  05/20/2018    There were no vitals filed for this visit.   Subjective Assessment - 03/11/21 0803    Subjective  Pt came in with concerns about return to driving. Pt denies any pain.    Patient is accompanied by: Family member   daughter   Pertinent History Lt frontal and Lt parietal lobe w/ distal M3 occlusion. PMHx: R breast cancer s/p lumpectomy and HTN.    Limitations Fall Risk. Loop recorder    Patient Stated Goals Pt reported no deficits.  Currently in Pain? No/denies                        OT Treatments/Exercises (OP) - 03/11/21 0001      Visual/Perceptual Exercises   Copy this Image Other    Other Assembling large 36 pc puzzle (bicycle) with only 2 v.c's to look for last straight edge pc and to find pc on far right. Pt required extra time to complete center portion - overall did well.    Other Exercises Copying pattern block designs (butterfly and fish) - pt able to do butterly I'ly but required mod assist for fish to angle pieces correctly. Following written and visual instructions for paper origami w/ min cues for hat, mod cues for airplane. Playing connect 4 for visual scanning, problem solving, thinking ahead - therapist won 3/3 games                    OT Short  Term Goals - 03/06/21 0852      OT SHORT TERM GOAL #1   Title Pt will be independent with HEP for grip strengthening RUE 02/21/21    Time 4    Period Weeks    Status Achieved    Target Date 02/21/21      OT SHORT TERM GOAL #2   Title Pt will perform environmental scanning with 90% accuracy or greater.    Baseline 80% with all 3 errors on right    Time 4    Period Weeks    Status Achieved   92%     OT SHORT TERM GOAL #3   Title Pt will perform warm meal prep and/or home management tasks with mod I    Time 4    Period Weeks    Status Partially Met   pt required verbal cues 02/15/21, however pt reports doing at home     OT SHORT TERM GOAL #4   Title Pt will improve grip strength in RUE by 5 lbs in order to increase functional use of RUE    Baseline RUE 27.9 lbs, LUE 45.4 lbs    Time 4    Period Weeks    Status Achieved   32.4 lbs 02/19/21            OT Long Term Goals - 03/06/21 0853      OT LONG TERM GOAL #1   Title Pt will be independent with any updated HEP 03/21/21    Time 8    Period Weeks    Status Deferred      OT LONG TERM GOAL #2   Title Pt will perform physical and cognitive task simultaneously with 95% accuracy    Time 8    Period Weeks    Status On-going   has increased difficulty in more distracting areas gym>hallway 02/22/2021.     OT LONG TERM GOAL #3   Title Pt will return to prior level of functioning for cooking and home management.    Time 8    Period Weeks    Status On-going   needed verbal cues for sequencing     OT LONG TERM GOAL #4   Title Pt will improve grip strength to 35 lbs or greater in RUE    Baseline RUE 27.9 lbs    Time 8    Period Weeks    Status Achieved   32.4 lbs with RUE - 02/19/21;  37 lbs 03/06/21  Plan - 03/11/21 0843    Clinical Impression Statement Pt continues to show decreased attention to Rt w/ larger table scanning and environmental scanning. Pt w/ decreased awareness into deficits    OT  Occupational Profile and History Problem Focused Assessment - Including review of records relating to presenting problem    Occupational performance deficits (Please refer to evaluation for details): IADL's;Leisure;ADL's    Body Structure / Function / Physical Skills ADL;Coordination;IADL;GMC;Dexterity;Decreased knowledge of use of DME;FMC;UE functional use;Vision    Cognitive Skills Attention;Safety Awareness;Perception;Understand    Rehab Potential Good    Clinical Decision Making Limited treatment options, no task modification necessary    Comorbidities Affecting Occupational Performance: None    Modification or Assistance to Complete Evaluation  No modification of tasks or assist necessary to complete eval    OT Frequency 2x / week    OT Duration 4 weeks   added 2x/week for 4 weeks at renewal 02/22/21   OT Treatment/Interventions Self-care/ADL training;DME and/or AE instruction;Passive range of motion;Therapeutic activities;Functional Mobility Training;Neuromuscular education;Patient/family education;Visual/perceptual remediation/compensation;Therapeutic exercise;Cognitive remediation/compensation    Plan Bop It, Problem Solving, dual tasking, right attention tasks, cooking    Consulted and Agree with Plan of Care Patient;Family member/caregiver    Family Member Consulted house mate           Patient will benefit from skilled therapeutic intervention in order to improve the following deficits and impairments:   Body Structure / Function / Physical Skills: ADL,Coordination,IADL,GMC,Dexterity,Decreased knowledge of use of DME,FMC,UE functional use,Vision Cognitive Skills: Attention,Safety Awareness,Perception,Understand     Visit Diagnosis: Visuospatial deficit  Attention and concentration deficit  Frontal lobe and executive function deficit    Problem List Patient Active Problem List   Diagnosis Date Noted  . Normocytic anemia 01/08/2021  . Acute CVA (cerebrovascular accident)  ([place]) 01/07/2021  . Pre-diabetes 01/07/2021  . Essential hypertension 01/06/2021  . Hyperlipidemia 01/06/2021  . Hypothyroidism 01/06/2021  . Genetic testing 01/28/2018  . S/P total knee replacement 01/04/2018  . Family history of breast cancer   . 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 Aneurysm of splenic artery ([place]) 08/23/2012    Carey Bullocks, OTR/L 03/11/2021, 8:44 AM  [facility] [address] [place], [place], [address] Phone: [phone]   Fax:  [phone]  Name: Crystal Trevino MRN: [id number] Date of Birth: 1941/11/22

## 2021-03-12 ENCOUNTER — Encounter: Payer: Self-pay | Admitting: Occupational Therapy

## 2021-03-12 ENCOUNTER — Ambulatory Visit: Payer: Federal, State, Local not specified - PPO | Admitting: Occupational Therapy

## 2021-03-12 DIAGNOSIS — M6281 Muscle weakness (generalized): Secondary | ICD-10-CM

## 2021-03-12 DIAGNOSIS — R278 Other lack of coordination: Secondary | ICD-10-CM

## 2021-03-12 DIAGNOSIS — R41842 Visuospatial deficit: Secondary | ICD-10-CM

## 2021-03-12 DIAGNOSIS — R2681 Unsteadiness on feet: Secondary | ICD-10-CM

## 2021-03-12 DIAGNOSIS — R41844 Frontal lobe and executive function deficit: Secondary | ICD-10-CM

## 2021-03-12 DIAGNOSIS — R41840 Attention and concentration deficit: Secondary | ICD-10-CM

## 2021-03-12 DIAGNOSIS — I69853 Hemiplegia and hemiparesis following other cerebrovascular disease affecting right non-dominant side: Secondary | ICD-10-CM

## 2021-03-12 NOTE — Therapy (Signed)
[facility] [address] [place], [place], [address] Phone: [phone]   Fax:  [phone] ` Occupational Therapy Treatment  Patient Details  Name: Crystal Trevino MRN: [id number] Date of Birth: July 04, 1941 Referring Provider (OT): Leonides Schanz [place] ([place])   Encounter Date: 03/12/2021   OT End of Session - 03/12/21 1533    Visit Number 14    Number of Visits 17   adjusted at renewal   Date for OT Re-Evaluation 03/29/21    Authorization Type BCBS Federal    Authorization Time Period 75 PT/OT/ST $25 copay (week 5 of 8)    Authorization - Visit Number 14    Authorization - Number of Visits 25    OT Start Time 6283    OT Stop Time 1615    OT Time Calculation (min) 43 min    Activity Tolerance Patient tolerated treatment well    Behavior During Therapy WFL for tasks assessed/performed           Past Medical History:  Diagnosis Date  . Arthritis   . Breast CA ([place])    right breast cancer  . Cancer ([place])    HAD RIGHT BREAST BX --[facility]ING [facility] 12/30/2017  . Constipation 08/13/2011  . Dizziness 08/13/2011  . Family history of adverse reaction to anesthesia    pt mother had PONV  . Family history of breast cancer   . Genetic testing 01/28/2018   Multi-Cancer panel (83 genes) @ [facility] - No pathogenic mutations detected  . GERD (gastroesophageal reflux disease)   . Heart murmur    "SLIGHT HEART MURMUR"  WAS PICKED UP AS AN ADULT  . Hyperlipidemia   . Hypertension   . Hypothyroidism   . Osteoarthritis of knee    Right  . Peripheral vascular disease ([place])   . Reflux   . Splenic artery aneurysm (HCC)    stable 1.5cm by 08/2013 CT; no further surveillance recommended  . Weight loss     Past Surgical History:  Procedure Laterality Date  . ABDOMINAL HYSTERECTOMY    . BACK SURGERY    . BREAST LUMPECTOMY WITH RADIOACTIVE SEED LOCALIZATION Right 02/19/2018   Procedure: BREAST LUMPECTOMY WITH  RADIOACTIVE SEED LOCALIZATION. RIGHT;  Surgeon: Alphonsa Overall, MD;  Location: [facility];  Service: General;  Laterality: Right;  . BREAST SURGERY    . CHOLECYSTECTOMY    . KNEE SURGERY    . LOOP RECORDER INSERTION N/A 01/10/2021   Procedure: LOOP RECORDER INSERTION;  Surgeon: Constance Haw, MD;  Location: [facility];  Service: Cardiovascular;  Laterality: N/A;  . LUMBAR LAMINECTOMY    . TOTAL KNEE ARTHROPLASTY Right 01/04/2018  . TOTAL KNEE ARTHROPLASTY Right 01/04/2018   Procedure: TOTAL KNEE ARTHROPLASTY;  Surgeon: Vickey Huger, MD;  Location: [facility];  Service: Orthopedics;  Laterality: Right;  Marland Kitchen VAGINAL PROLAPSE REPAIR     2015   DONE IN [place]  . VAGINAL PROLAPSE REPAIR  05/20/2018    There were no vitals filed for this visit.   Subjective Assessment - 03/12/21 1533    Subjective  Pt denies any pain reports things are going well at home. Pt has a quilting class all day tomorrow.    Patient is accompanied by: Family member   daughter   Pertinent History Lt frontal and Lt parietal lobe w/ distal M3 occlusion. PMHx: R breast cancer s/p lumpectomy and HTN.    Limitations Fall Risk. Loop recorder    Patient  Stated Goals Pt reported no deficits.    Currently in Pain? No/denies           TREATMENT:  Constant Therapy Repeat a Pattern level 5 with 69 % accuracy and 30.85s response time. Pt with errors d/t impulsivity. Everyday Math level 1 - did not finish or keep accuracy d/t error with application. Pt continued with impulsive behaviors and pressing buttons before completing equations.   Sequencing dog sitting worksheet and placing steps in order. Pt with many errors   Physical/Cognitive golf balls in RUE and word finding cities/states with increased difficulty with end of alphabet and sequencing alphabet and with manipulating golf balls during more challenging word finding letters. Pt would stop moving golf balls when having to think about a word.  Blink 1 person with  organizing and sorting with speed and accuracy by shape, quantity and color. Pt with min difficulty with task and increased time but overall did better than last time with recalling categories, etc.                         OT Short Term Goals - 03/06/21 0852      OT SHORT TERM GOAL #1   Title Pt will be independent with HEP for grip strengthening RUE 02/21/21    Time 4    Period Weeks    Status Achieved    Target Date 02/21/21      OT SHORT TERM GOAL #2   Title Pt will perform environmental scanning with 90% accuracy or greater.    Baseline 80% with all 3 errors on right    Time 4    Period Weeks    Status Achieved   92%     OT SHORT TERM GOAL #3   Title Pt will perform warm meal prep and/or home management tasks with mod I    Time 4    Period Weeks    Status Partially Met   pt required verbal cues 02/15/21, however pt reports doing at home     OT SHORT TERM GOAL #4   Title Pt will improve grip strength in RUE by 5 lbs in order to increase functional use of RUE    Baseline RUE 27.9 lbs, LUE 45.4 lbs    Time 4    Period Weeks    Status Achieved   32.4 lbs 02/19/21            OT Long Term Goals - 03/06/21 0853      OT LONG TERM GOAL #1   Title Pt will be independent with any updated HEP 03/21/21    Time 8    Period Weeks    Status Deferred      OT LONG TERM GOAL #2   Title Pt will perform physical and cognitive task simultaneously with 95% accuracy    Time 8    Period Weeks    Status On-going   has increased difficulty in more distracting areas gym>hallway 02/22/2021.     OT LONG TERM GOAL #3   Title Pt will return to prior level of functioning for cooking and home management.    Time 8    Period Weeks    Status On-going   needed verbal cues for sequencing     OT LONG TERM GOAL #4   Title Pt will improve grip strength to 35 lbs or greater in RUE    Baseline RUE 27.9 lbs    Time 8  Period Weeks    Status Achieved   32.4 lbs with RUE -  02/19/21;  37 lbs 03/06/21                 Patient will benefit from skilled therapeutic intervention in order to improve the following deficits and impairments:           Visit Diagnosis: Visuospatial deficit  Attention and concentration deficit  Frontal lobe and executive function deficit  Unsteadiness on feet  Other lack of coordination  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 Normocytic anemia 01/08/2021  . Acute CVA (cerebrovascular accident) ([place]) 01/07/2021  . Pre-diabetes 01/07/2021  . Essential hypertension 01/06/2021  . Hyperlipidemia 01/06/2021  . Hypothyroidism 01/06/2021  . Genetic testing 01/28/2018  . S/P total knee replacement 01/04/2018  . Family history of breast cancer   . 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 Aneurysm of splenic artery ([place]) 08/23/2012    Zachery Conch MOT, OTR/L  03/12/2021, 4:24 PM  [facility] [address] [place], [place], [address] Phone: [phone]   Fax:  [phone]  Name: Crystal Trevino MRN: [id number] Date of Birth: Jan 18, 1941

## 2021-03-13 ENCOUNTER — Ambulatory Visit: Payer: Federal, State, Local not specified - PPO | Admitting: Physical Therapy

## 2021-03-13 ENCOUNTER — Ambulatory Visit: Payer: Federal, State, Local not specified - PPO | Admitting: Occupational Therapy

## 2021-03-13 ENCOUNTER — Encounter: Payer: Federal, State, Local not specified - PPO | Admitting: Speech Pathology

## 2021-03-18 ENCOUNTER — Ambulatory Visit: Payer: Federal, State, Local not specified - PPO | Admitting: Occupational Therapy

## 2021-03-18 ENCOUNTER — Other Ambulatory Visit: Payer: Self-pay

## 2021-03-18 ENCOUNTER — Ambulatory Visit: Payer: Federal, State, Local not specified - PPO | Admitting: Physical Therapy

## 2021-03-18 ENCOUNTER — Ambulatory Visit (INDEPENDENT_AMBULATORY_CARE_PROVIDER_SITE_OTHER): Payer: Federal, State, Local not specified - PPO

## 2021-03-18 DIAGNOSIS — I639 Cerebral infarction, unspecified: Secondary | ICD-10-CM | POA: Diagnosis not present

## 2021-03-18 DIAGNOSIS — R41842 Visuospatial deficit: Secondary | ICD-10-CM

## 2021-03-18 DIAGNOSIS — R41844 Frontal lobe and executive function deficit: Secondary | ICD-10-CM

## 2021-03-18 DIAGNOSIS — R41840 Attention and concentration deficit: Secondary | ICD-10-CM

## 2021-03-18 NOTE — Therapy (Signed)
[facility] [address] [place], [place], [address] Phone: [phone]   Fax:  [phone]  Occupational Therapy Treatment  Patient Details  Name: Crystal Trevino MRN: [id number] Date of Birth: 04/24/1941 Referring Provider (OT): Leonides Schanz [place] ([place])   Encounter Date: 03/18/2021   OT End of Session - 03/18/21 1024    Visit Number 15    Number of Visits 17   adjusted at renewal   Date for OT Re-Evaluation 03/29/21    Authorization Type BCBS Federal    Authorization Time Period 75 PT/OT/ST $25 copay (week 5 of 8)    Authorization - Visit Number 15    Authorization - Number of Visits 25    OT Start Time 1021    OT Stop Time 1100    OT Time Calculation (min) 39 min    Activity Tolerance Patient tolerated treatment well    Behavior During Therapy WFL for tasks assessed/performed           Past Medical History:  Diagnosis Date  . Arthritis   . Breast CA ([place])    right breast cancer  . Cancer ([place])    HAD RIGHT BREAST BX --GOING [facility] 12/30/2017  . Constipation 08/13/2011  . Dizziness 08/13/2011  . Family history of adverse reaction to anesthesia    pt mother had PONV  . Family history of breast cancer   . Genetic testing 01/28/2018   Multi-Cancer panel (83 genes) @ [facility] - No pathogenic mutations detected  . GERD (gastroesophageal reflux disease)   . Heart murmur    "SLIGHT HEART MURMUR"  WAS PICKED UP AS AN ADULT  . Hyperlipidemia   . Hypertension   . Hypothyroidism   . Osteoarthritis of knee    Right  . Peripheral vascular disease ([place])   . Reflux   . Splenic artery aneurysm ([facility])    stable 1.5cm by 08/2013 CT; no further surveillance recommended  . Weight loss     Past Surgical History:  Procedure Laterality Date  . ABDOMINAL HYSTERECTOMY    . BACK SURGERY    . BREAST LUMPECTOMY WITH RADIOACTIVE SEED LOCALIZATION Right 02/19/2018   Procedure: BREAST LUMPECTOMY WITH  RADIOACTIVE SEED LOCALIZATION. RIGHT;  Surgeon: Alphonsa Overall, MD;  Location: [facility];  Service: General;  Laterality: Right;  . BREAST SURGERY    . CHOLECYSTECTOMY    . KNEE SURGERY    . LOOP RECORDER INSERTION N/A 01/10/2021   Procedure: LOOP RECORDER INSERTION;  Surgeon: Constance Haw, MD;  Location: [facility];  Service: Cardiovascular;  Laterality: N/A;  . LUMBAR LAMINECTOMY    . TOTAL KNEE ARTHROPLASTY Right 01/04/2018  . TOTAL KNEE ARTHROPLASTY Right 01/04/2018   Procedure: TOTAL KNEE ARTHROPLASTY;  Surgeon: Vickey Huger, MD;  Location: [facility];  Service: Orthopedics;  Laterality: Right;  Marland Kitchen VAGINAL PROLAPSE REPAIR     2015   DONE IN [place]  . VAGINAL PROLAPSE REPAIR  05/20/2018    There were no vitals filed for this visit.   Subjective Assessment - 03/18/21 1022    Subjective  Pt reports that she went to her quilting class and is did great.  Pt reports slow at cutting (using pizza-cutter type blade).    Patient is accompanied by: Family member   daughter   Pertinent History Lt frontal and Lt parietal lobe w/ distal M3 occlusion. PMHx: R breast cancer s/p lumpectomy and HTN.    Limitations Fall Risk. Loop recorder  Patient Stated Goals Pt reported no deficits.    Currently in Pain? No/denies           Constant Therapy:   Alternating Symbol Match, level 8 with 93% accuracy, 80.54sec average response time. Infer from Multiple paragraphs, level 3 with 100% accuracy and 91.34sec average response time.  Tabletop visual scanning in larger area to locate specific pictures on Spot It Cards.    First picture with 1/6 initially, 4/6 (with min cues), then remaining with mod cueing.  Pt reports difficulty with different sizes initially. Second picture with 4/6 initially, remaining with min questioning cues Third picture with mod cueing initially (needed to show example), then 5/6. Fourth picture with 4/4. Fifth picture with 2/4, min cues for remaining. Sixth picture 3/3.    Seventh picture 4/4. * Incr ease with each picture due to decr number of cards after each match.         OT Short Term Goals - 03/06/21 0852      OT SHORT TERM GOAL #1   Title Pt will be independent with HEP for grip strengthening RUE 02/21/21    Time 4    Period Weeks    Status Achieved    Target Date 02/21/21      OT SHORT TERM GOAL #2   Title Pt will perform environmental scanning with 90% accuracy or greater.    Baseline 80% with all 3 errors on right    Time 4    Period Weeks    Status Achieved   92%     OT SHORT TERM GOAL #3   Title Pt will perform warm meal prep and/or home management tasks with mod I    Time 4    Period Weeks    Status Partially Met   pt required verbal cues 02/15/21, however pt reports doing at home     OT SHORT TERM GOAL #4   Title Pt will improve grip strength in RUE by 5 lbs in order to increase functional use of RUE    Baseline RUE 27.9 lbs, LUE 45.4 lbs    Time 4    Period Weeks    Status Achieved   32.4 lbs 02/19/21            OT Long Term Goals - 03/06/21 0853      OT LONG TERM GOAL #1   Title Pt will be independent with any updated HEP 03/21/21    Time 8    Period Weeks    Status Deferred      OT LONG TERM GOAL #2   Title Pt will perform physical and cognitive task simultaneously with 95% accuracy    Time 8    Period Weeks    Status On-going   has increased difficulty in more distracting areas gym>hallway 02/22/2021.     OT LONG TERM GOAL #3   Title Pt will return to prior level of functioning for cooking and home management.    Time 8    Period Weeks    Status On-going   needed verbal cues for sequencing     OT LONG TERM GOAL #4   Title Pt will improve grip strength to 35 lbs or greater in RUE    Baseline RUE 27.9 lbs    Time 8    Period Weeks    Status Achieved   32.4 lbs with RUE - 02/19/21;  37 lbs 03/06/21  Plan - 03/18/21 1027    Clinical Impression Statement Pt continues to show difficulty  with larger tabletop scanning and some alternating attention. Pt also with continued decreased awareness into deficits    OT Occupational Profile and History Problem Focused Assessment - Including review of records relating to presenting problem    Occupational performance deficits (Please refer to evaluation for details): IADL's;Leisure;ADL's    Body Structure / Function / Physical Skills ADL;Coordination;IADL;GMC;Dexterity;Decreased knowledge of use of DME;FMC;UE functional use;Vision    Cognitive Skills Attention;Safety Awareness;Perception;Understand    Rehab Potential Good    Clinical Decision Making Limited treatment options, no task modification necessary    Comorbidities Affecting Occupational Performance: None    Modification or Assistance to Complete Evaluation  No modification of tasks or assist necessary to complete eval    OT Frequency 2x / week    OT Duration 4 weeks   added 2x/week for 4 weeks at renewal 02/22/21   OT Treatment/Interventions Self-care/ADL training;DME and/or AE instruction;Passive range of motion;Therapeutic activities;Functional Mobility Training;Neuromuscular education;Patient/family education;Visual/perceptual remediation/compensation;Therapeutic exercise;Cognitive remediation/compensation    Plan continue to address cognition and visual scanning,check remaining goals    Consulted and Agree with Plan of Care Patient;Family member/caregiver    Family Member Consulted house mate           Patient will benefit from skilled therapeutic intervention in order to improve the following deficits and impairments:   Body Structure / Function / Physical Skills: ADL,Coordination,IADL,GMC,Dexterity,Decreased knowledge of use of DME,FMC,UE functional use,Vision Cognitive Skills: Attention,Safety Awareness,Perception,Understand     Visit Diagnosis: Visuospatial deficit  Attention and concentration deficit  Frontal lobe and executive function deficit    Problem  List Patient Active Problem List   Diagnosis Date Noted  . Normocytic anemia 01/08/2021  . Acute CVA (cerebrovascular accident) ([place]) 01/07/2021  . Pre-diabetes 01/07/2021  . Essential hypertension 01/06/2021  . Hyperlipidemia 01/06/2021  . Hypothyroidism 01/06/2021  . Genetic testing 01/28/2018  . S/P total knee replacement 01/04/2018  . Family history of breast cancer   . 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 Aneurysm of splenic artery ([place]) 08/23/2012    [facility] 03/18/2021, 11:13 AM  [facility] [address] [place], [place], [address] Phone: [phone]   Fax:  [phone]  Name: Crystal Trevino MRN: [id number] Date of Birth: 1941-11-18   Vianne Bulls, OTR/L [facility] [address] [place], [place]  [address] [phone] phone [phone] 03/18/21 11:13 AM

## 2021-03-19 LAB — CUP PACEART REMOTE DEVICE CHECK
Date Time Interrogation Session: 20220418160318
Implantable Pulse Generator Implant Date: 20220210

## 2021-03-21 ENCOUNTER — Encounter: Payer: Self-pay | Admitting: Occupational Therapy

## 2021-03-21 ENCOUNTER — Ambulatory Visit: Payer: Federal, State, Local not specified - PPO | Admitting: Occupational Therapy

## 2021-03-21 ENCOUNTER — Other Ambulatory Visit: Payer: Self-pay

## 2021-03-21 DIAGNOSIS — I69853 Hemiplegia and hemiparesis following other cerebrovascular disease affecting right non-dominant side: Secondary | ICD-10-CM

## 2021-03-21 DIAGNOSIS — R41842 Visuospatial deficit: Secondary | ICD-10-CM | POA: Diagnosis not present

## 2021-03-21 DIAGNOSIS — M6281 Muscle weakness (generalized): Secondary | ICD-10-CM

## 2021-03-21 DIAGNOSIS — R41844 Frontal lobe and executive function deficit: Secondary | ICD-10-CM

## 2021-03-21 DIAGNOSIS — R2681 Unsteadiness on feet: Secondary | ICD-10-CM

## 2021-03-21 DIAGNOSIS — R41840 Attention and concentration deficit: Secondary | ICD-10-CM

## 2021-03-21 DIAGNOSIS — R278 Other lack of coordination: Secondary | ICD-10-CM

## 2021-03-21 NOTE — Therapy (Signed)
[facility] [address] [place], [place], [address] Phone: [phone]   Fax:  [phone]  Occupational Therapy Treatment  Patient Details  Name: Crystal Trevino MRN: [id number] Date of Birth: 1941-01-04 Referring Provider (OT): Leonides Schanz Carman ([place])   Encounter Date: 03/21/2021   OT End of Session - 03/21/21 1018    Visit Number 16    Number of Visits 17   adjusted at renewal   Date for OT Re-Evaluation 03/29/21    Authorization Type BCBS Federal    Authorization Time Period 75 PT/OT/ST $25 copay (week 5 of 8)    Authorization - Visit Number 16    Authorization - Number of Visits 25    OT Start Time 1017    OT Stop Time 1100    OT Time Calculation (min) 43 min    Activity Tolerance Patient tolerated treatment well    Behavior During Therapy WFL for tasks assessed/performed           Past Medical History:  Diagnosis Date  . Arthritis   . Breast CA ([place])    right breast cancer  . Cancer ([place])    HAD RIGHT BREAST BX --GOING TO [facility]ER [facility] ON WED 12/30/2017  . Constipation 08/13/2011  . Dizziness 08/13/2011  . Family history of adverse reaction to anesthesia    pt mother had PONV  . Family history of breast cancer   . Genetic testing 01/28/2018   Multi-Cancer panel (83 genes) @ [facility] - No pathogenic mutations detected  . GERD (gastroesophageal reflux disease)   . Heart murmur    "SLIGHT HEART MURMUR"  WAS PICKED UP AS AN ADULT  . Hyperlipidemia   . Hypertension   . Hypothyroidism   . Osteoarthritis of knee    Right  . Peripheral vascular disease ([place])   . Reflux   . Splenic artery aneurysm (HCC)    stable 1.5cm by 08/2013 CT; no further surveillance recommended  . Weight loss     Past Surgical History:  Procedure Laterality Date  . ABDOMINAL HYSTERECTOMY    . BACK SURGERY    . BREAST LUMPECTOMY WITH RADIOACTIVE SEED LOCALIZATION Right 02/19/2018   Procedure: BREAST LUMPECTOMY WITH  RADIOACTIVE SEED LOCALIZATION. RIGHT;  Surgeon: Alphonsa Overall, MD;  Location: [facility];  Service: General;  Laterality: Right;  . BREAST SURGERY    . CHOLECYSTECTOMY    . KNEE SURGERY    . LOOP RECORDER INSERTION N/A 01/10/2021   Procedure: LOOP RECORDER INSERTION;  Surgeon: Constance Haw, MD;  Location: [facility];  Service: Cardiovascular;  Laterality: N/A;  . LUMBAR LAMINECTOMY    . TOTAL KNEE ARTHROPLASTY Right 01/04/2018  . TOTAL KNEE ARTHROPLASTY Right 01/04/2018   Procedure: TOTAL KNEE ARTHROPLASTY;  Surgeon: Vickey Huger, MD;  Location: [facility];  Service: Orthopedics;  Laterality: Right;  Marland Kitchen VAGINAL PROLAPSE REPAIR     2015   DONE IN [place]  . VAGINAL PROLAPSE REPAIR  05/20/2018    There were no vitals filed for this visit.   Subjective Assessment - 03/21/21 1019    Subjective  Pt reports things are getting easier. Pt denies any pain. "I've been doing a lot of word searches and those kind of puzzles and hidden pictures"    Patient is accompanied by: Family member   daughter   Pertinent History Lt frontal and Lt parietal lobe w/ distal M3 occlusion. PMHx: R breast cancer s/p lumpectomy and HTN.    Limitations Fall  Risk. Loop recorder    Patient Stated Goals Pt reported no deficits.    Currently in Pain? No/denies             Digital/Analog Times with visual scanning large surface area with increased time   Sequence Cards Pt complete with 2/8 correct (first and last card) on first attempt. Pt req'd max assistance for attending to details and sequencing items.  Sequencing Typical Day  req'd moderate assistance for organizing information and sequencing steps. Pt did well with recall of different steps but had increased difficulty with organization of   [place] level 1 & 2 with max assistance for sequencing problems in order to solve the problem .                    OT Short Term Goals - 03/06/21 0852      OT SHORT TERM GOAL #1   Title Pt will be  independent with HEP for grip strengthening RUE 02/21/21    Time 4    Period Weeks    Status Achieved    Target Date 02/21/21      OT SHORT TERM GOAL #2   Title Pt will perform environmental scanning with 90% accuracy or greater.    Baseline 80% with all 3 errors on right    Time 4    Period Weeks    Status Achieved   92%     OT SHORT TERM GOAL #3   Title Pt will perform warm meal prep and/or home management tasks with mod I    Time 4    Period Weeks    Status Partially Met   pt required verbal cues 02/15/21, however pt reports doing at home     OT SHORT TERM GOAL #4   Title Pt will improve grip strength in RUE by 5 lbs in order to increase functional use of RUE    Baseline RUE 27.9 lbs, LUE 45.4 lbs    Time 4    Period Weeks    Status Achieved   32.4 lbs 02/19/21            OT Long Term Goals - 03/06/21 0853      OT LONG TERM GOAL #1   Title Pt will be independent with any updated HEP 03/21/21    Time 8    Period Weeks    Status Deferred      OT LONG TERM GOAL #2   Title Pt will perform physical and cognitive task simultaneously with 95% accuracy    Time 8    Period Weeks    Status On-going   has increased difficulty in more distracting areas gym>hallway 02/22/2021.     OT LONG TERM GOAL #3   Title Pt will return to prior level of functioning for cooking and home management.    Time 8    Period Weeks    Status On-going   needed verbal cues for sequencing     OT LONG TERM GOAL #4   Title Pt will improve grip strength to 35 lbs or greater in RUE    Baseline RUE 27.9 lbs    Time 8    Period Weeks    Status Achieved   32.4 lbs with RUE - 02/19/21;  37 lbs 03/06/21                Plan - 03/21/21 1025    Clinical Impression Statement Increased time d/t difficulty with larger surface  area visual scanning. Pt continues with overall decreased awareness into cognitive deficits.    OT Occupational Profile and History Problem Focused Assessment - Including review of  records relating to presenting problem    Occupational performance deficits (Please refer to evaluation for details): IADL's;Leisure;ADL's    Body Structure / Function / Physical Skills ADL;Coordination;IADL;GMC;Dexterity;Decreased knowledge of use of DME;FMC;UE functional use;Vision    Cognitive Skills Attention;Safety Awareness;Perception;Understand    Rehab Potential Good    Clinical Decision Making Limited treatment options, no task modification necessary    Comorbidities Affecting Occupational Performance: None    Modification or Assistance to Complete Evaluation  No modification of tasks or assist necessary to complete eval    OT Frequency 2x / week    OT Duration 4 weeks   added 2x/week for 4 weeks at renewal 02/22/21   OT Treatment/Interventions Self-care/ADL training;DME and/or AE instruction;Passive range of motion;Therapeutic activities;Functional Mobility Training;Neuromuscular education;Patient/family education;Visual/perceptual remediation/compensation;Therapeutic exercise;Cognitive remediation/compensation    Plan continue to address cognition and visual scanning,check remaining goals    Consulted and Agree with Plan of Care Patient;Family member/caregiver    Family Member Consulted house mate           Patient will benefit from skilled therapeutic intervention in order to improve the following deficits and impairments:   Body Structure / Function / Physical Skills: ADL,Coordination,IADL,GMC,Dexterity,Decreased knowledge of use of DME,FMC,UE functional use,Vision Cognitive Skills: Attention,Safety Awareness,Perception,Understand     Visit Diagnosis: Visuospatial deficit  Attention and concentration deficit  Frontal lobe and executive function deficit  Unsteadiness on feet  Other lack of coordination  Hemiplegia and 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 Normocytic anemia 01/08/2021  . Acute CVA (cerebrovascular accident) ([place]) 01/07/2021  . Pre-diabetes 01/07/2021  . Essential hypertension 01/06/2021  . Hyperlipidemia 01/06/2021  . Hypothyroidism 01/06/2021  . Genetic testing 01/28/2018  . S/P total knee replacement 01/04/2018  . Family history of breast cancer   . 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 Aneurysm of splenic artery ([place]) 08/23/2012    Zachery Conch MOT, OTR/L  03/21/2021, 11:33 AM  [facility] [address] [place], [place], [address] Phone: [phone]   Fax:  [phone]  Name: KEONA BILYEU MRN: [id number] Date of Birth: 12/02/1940

## 2021-03-22 ENCOUNTER — Ambulatory Visit: Payer: Federal, State, Local not specified - PPO | Admitting: Occupational Therapy

## 2021-03-28 ENCOUNTER — Other Ambulatory Visit: Payer: Self-pay | Admitting: Oncology

## 2021-03-31 NOTE — Progress Notes (Signed)
[facility]  Telephone:([phone] [phone]     ID: Crystal Trevino DOB: [birth year]  MR#: [id number]  BZM#:[id number]  Patient Care Team: Marthe Patch as PCP - General (Physician Assistant) Lailany Enoch, Virgie Dad, MD as Consulting Physician (Oncology) Vickey Huger, MD as Consulting Physician (Orthopedic Surgery) Marti Sleigh, MD as Consulting Physician (Gynecology) Gery Pray, MD as Consulting Physician (Radiation Oncology) Alphonsa Overall, MD as Consulting Physician (General Surgery) Marti Sleigh, MD as Consulting Physician (Gynecology) OTHER MD:  CHIEF COMPLAINT: Estrogen receptor positive DCIS  CURRENT TREATMENT: Observation   INTERVAL HISTORY: Crystal Trevino returns today for follow-up of her noninvasive breast cancer.   She had a stroke February 05/20/2021 with brain MRI 01/06/2021 showing 1. 3.5 cm region of acute infarction in the left frontal operculum. Mild swelling but no hemorrhage. Findings consistent with distal vessel left MCA occlusion, at least M3. 2. Second separate area of acute infarction posterior to that in the left parietal lobe, considerably smaller. This is also consistent with a distal branch vessel left MCA occlusion.  This was felt to be most likely an embolic stroke secondary to an unknown source.  Echo showed good ejection fraction, mild AVR and mild MVR.  Dopplers were negative for DVT.  She had a loop implant placed.  So far apparently no significant arrhythmia has been noted  Her tamoxifen was stopped February 2022.  She is now on Plavix.  Since her last visit, she underwent bilateral diagnostic mammography with tomography at [facility] on 04/24/2020 showing: breast density category C; no evidence of malignancy in either breast.  She is scheduled for repeat mammography late May 2022   REVIEW OF SYSTEMS: Crystal Trevino has recovered wonderfully from her stroke.  She really has no residuals.  Speech is clear, walking is  normal, no falls, no headaches, no visual changes, no focal weakness, no focal sensory loss.  She is doing everything except driving and she is hoping to resume driving at some point later this year.  A detailed review of systems was otherwise stable.   COVID 19 VACCINATION STATUS: [place] x2, most recently 01/2020   HISTORY OF CURRENT ILLNESS: From the original intake note:  "Crystal Trevino" had routine screening mammography on 12/09/2017 showing a possible abnormality in the right breast. She underwent bilateral diagnostic mammography with tomography and right breast ultrasonography at [facility] on 12/18/2017 the breast density to be category C.  There was an area of grouped heterogeneous calcifications in the lower outer quadrant but no significant masses ultrasonography found no abnormalities in the right axilla.    Accordingly on 12/21/2017 she proceeded to biopsy of the right breast area in question. 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  The patient's subsequent history is as detailed below.   PAST MEDICAL HISTORY: Past Medical History:  Diagnosis Date  . Arthritis   . Breast CA ([place])    right breast cancer  . Cancer ([place])    HAD RIGHT BREAST BX --GOING TO CANCER CENTER ON [facility]ED 12/30/2017  . Constipation 08/13/2011  . Dizziness 08/13/2011  . Family history of adverse reaction to anesthesia    pt mother had PONV  . Family history of breast cancer   . Genetic testing 01/28/2018   Multi-Cancer panel (83 genes) @ [facility] - No pathogenic mutations detected  . GERD (gastroesophageal reflux disease)   . Heart murmur    "  SLIGHT HEART MURMUR"  WAS PICKED UP AS AN ADULT  . Hyperlipidemia   . Hypertension   . Hypothyroidism   . Osteoarthritis of knee    Right  . Peripheral vascular disease ([place])   . Reflux   .  Splenic artery aneurysm (HCC)    stable 1.5cm by 08/2013 CT; no further surveillance recommended  . Weight loss     PAST SURGICAL HISTORY: Past Surgical History:  Procedure Laterality Date  . ABDOMINAL HYSTERECTOMY    . BACK SURGERY    . BREAST LUMPECTOMY WITH RADIOACTIVE SEED LOCALIZATION Right 02/19/2018   Procedure: BREAST LUMPECTOMY WITH RADIOACTIVE SEED LOCALIZATION. RIGHT;  Surgeon: Alphonsa Overall, MD;  Location: [facility];  Service: General;  Laterality: Right;  . BREAST SURGERY    . CHOLECYSTECTOMY    . KNEE SURGERY    . LOOP RECORDER INSERTION N/A 01/10/2021   Procedure: LOOP RECORDER INSERTION;  Surgeon: Constance Haw, MD;  Location: [facility];  Service: Cardiovascular;  Laterality: N/A;  . LUMBAR LAMINECTOMY    . TOTAL KNEE ARTHROPLASTY Right 01/04/2018  . TOTAL KNEE ARTHROPLASTY Right 01/04/2018   Procedure: TOTAL KNEE ARTHROPLASTY;  Surgeon: Vickey Huger, MD;  Location: [facility];  Service: Orthopedics;  Laterality: Right;  Marland Kitchen VAGINAL PROLAPSE REPAIR     2015   DONE IN [place]  . VAGINAL PROLAPSE REPAIR  05/20/2018  Bilateral cataract extraction, Gall bladder removal. Hysterectomy with USO and remaining ovary was later removed.   FAMILY HISTORY  Family History  Problem Relation Age of Onset  . Heart disease Mother   . Breast cancer Mother 39       deceased 50; TAH/BSO (age?)  . Heart disease Father   . Breast cancer Daughter        dx 12s; currenlty 62; Neg BRCA1/BRCA2  . Breast cancer Maternal Aunt 72       deceased 14  Her father passed from heart complications at age 10. Her mother also passed away from heart complications at age 59. She notes that her mother had breast cancer and was diagnosed in her 33's. Her daughter, [name] was diagnosed with breast cancer in her 47's. Her daughter was genetically testing in 2011. She denies a family history of ovarian cancer.   GYNECOLOGIC HISTORY:  No LMP recorded. Patient has had a hysterectomy. Menarche: [age] years  old Age at first live birth: 80 years old GXP4 LMP: 1987 Contraceptive: oral pills with no complications HRT: Premarin since 1987 until January 2019 Hysterectomy with USO and the remaining ovary was later removed.    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  The patient's son, [name], works in Insurance underwriter in [place], [place]. Her daughter, [name], is a Nutritional therapist at [employer].  The patient's daughter, [name], works in an office in [place].  Patient's son, [name], is a Customer service manager in [place], [place]. She has 4 grandchildren and 1 step grandchild.   ADVANCED DIRECTIVES: [name] [phone]   HEALTH MAINTENANCE: Social History   Tobacco Use  . Smoking status: Never Smoker  . Smokeless tobacco: Never Used  Vaping Use  . Vaping Use: Never used  Substance Use Topics  . Alcohol use: No  . Drug use: No     Colonoscopy: [facility] due this year  PAP:   Bone density: March 2013 at [facility] T score -  0.6 normal   Allergies  Allergen Reactions  . Penicillins Nausea And Vomiting, Rash and Other (See Comments)    PATIENT HAS HAD A PCN REACTION WITH IMMEDIATE RASH, FACIAL/TONGUE/THROAT SWELLING, SOB, OR LIGHTHEADEDNESS WITH HYPOTENSION:  #  #  #  YES  #  #  #   Has patient had a PCN reaction causing severe rash involving mucus membranes or skin necrosis: No Has patient had a PCN reaction that required hospitalization: No Has patient had a PCN reaction occurring within the last 10 years: No     Current Outpatient Medications  Medication Sig Dispense Refill  . carvedilol (COREG) 12.5 MG tablet Take 12.5 mg by mouth 2 (two) times daily with a meal.    . clopidogrel (PLAVIX) 75 MG tablet Take 75 mg by mouth daily.    . fluticasone (FLONASE) 50 MCG/ACT nasal spray Place 1 spray into both nostrils daily as needed for allergies or  rhinitis.    Marland Kitchen gabapentin (NEURONTIN) 300 MG capsule Take 1 capsule (300 mg total) by mouth at bedtime. 90 capsule 4  . levothyroxine (SYNTHROID, LEVOTHROID) 75 MCG tablet Take 75 mcg by mouth daily before breakfast.    . Multiple Vitamins-Minerals (CENTRUM SILVER) tablet Take 1 tablet by mouth daily.    Marland Kitchen senna-docusate (SENOKOT-S) 8.6-50 MG tablet Take 1 tablet by mouth at bedtime as needed for mild constipation. 30 tablet 0  . simvastatin (ZOCOR) 20 MG tablet Take 20 mg by mouth at bedtime.     No current facility-administered medications for this visit.    OBJECTIVE:  white woman who appears stated age  Vitals:   04/01/21 0859  BP: (!) 141/54  Pulse: 77  Resp: 18  Temp: (!) 97.5 F (36.4 C)  SpO2: 99%     Body mass index is 26.74 kg/m.   Wt Readings from Last 3 Encounters:  04/01/21 165 lb 11.2 oz (75.2 kg)  02/14/21 162 lb (73.5 kg)  02/07/21 160 lb (72.6 kg)      ECOG FS:1 - Symptomatic but completely ambulatory  Sclerae unicteric, EOMs intact Wearing a mask No cervical or supraclavicular adenopathy Lungs no rales or rhonchi Heart regular rate and rhythm Abd soft, nontender, positive bowel sounds MSK no focal spinal tenderness, no upper extremity lymphedema Neuro: nonfocal, well oriented, appropriate affect Breasts: The right breast is status post lumpectomy and radiation.  There is no evidence of local recurrence.  The left breast is benign.  Both axillae are benign   LAB RESULTS:  CMP     Component Value Date/Time   NA 138 01/08/2021 0044   K 4.0 01/08/2021 0044   CL 103 01/08/2021 0044   CO2 25 01/08/2021 0044   GLUCOSE 126 (H) 01/08/2021 0044   BUN 12 01/08/2021 0044   CREATININE 0.80 01/08/2021 0044   CREATININE 0.94 08/26/2013 1710   CALCIUM 8.7 (L) 01/08/2021 0044   PROT 6.1 (L) 01/06/2021 1652   ALBUMIN 3.4 (L) 01/06/2021 1652   AST 18 01/06/2021 1652   ALT 11 01/06/2021 1652   ALKPHOS 52 01/06/2021 1652   BILITOT 0.6 01/06/2021 1652    GFRNONAA >60 01/08/2021 0044   GFRAA >60 02/19/2018 0924    No results found for: TOTALPROTELP, ALBUMINELP, A1GS, A2GS, BETS, BETA2SER, GAMS, MSPIKE, SPEI  No results found for: KPAFRELGTCHN, LAMBDASER, KAPLAMBRATIO  Lab Results  Component Value Date   WBC 6.1 01/07/2021   NEUTROABS 3.0 01/06/2021   HGB 10.2 (L) 01/07/2021   HCT 32.3 (L) 01/07/2021  MCV 95.6 01/07/2021   PLT 195 01/07/2021   No results found for: LABCA2  No components found for: QZRAQT622  No results for input(s): INR in the last 168 hours.  No results found for: LABCA2  No results found for: QJF354  No results found for: TGY563  No results found for: SLH734  No results found for: CA2729  No components found for: HGQUANT  No results found for: CEA1 / No results found for: CEA1   No results found for: AFPTUMOR  No results found for: CHROMOGRNA  No results found for: HGBA, HGBA2QUANT, HGBFQUANT, HGBSQUAN (Hemoglobinopathy evaluation)   No results found for: LDH  No results found for: IRON, TIBC, IRONPCTSAT (Iron and TIBC)  No results found for: FERRITIN  Urinalysis No results found for: COLORURINE, APPEARANCEUR, LABSPEC, PHURINE, GLUCOSEU, HGBUR, BILIRUBINUR, KETONESUR, PROTEINUR, UROBILINOGEN, NITRITE, LEUKOCYTESUR   STUDIES: CUP PACEART REMOTE DEVICE CHECK  Result Date: 03/19/2021 ILR summary report received. Battery status OK. Normal device function. No new symptom, tachy, brady, or pause episodes. No new AF episodes. Monthly summary reports and ROV/PRN    ELIGIBLE FOR AVAILABLE RESEARCH PROTOCOL: no  ASSESSMENT: 80 y.o. [place], [place] Woman status post right breast lower outer quadrant biopsy on 12/21/2017 for a ductal carcinoma in situ, high-grade, estrogen and progesterone receptor positive  (1) genetics testing 01/28/2018 through  [facility]'s Multi-Cancer panel found no deleterious mutations in (ALK, APC, ATM, AXIN2, BAP1, BARD1, BLM, BMPR1A, BRCA1, BRCA2, BRIP1, CASR, CDC73,  CDH1, CDK4, CDKN1B, CDKN1C, CDKN2A, CEBPA, CHEK2, CTNNA1, DICER1, DIS3L2, EGFR, EPCAM, FH, FLCN, GATA2, GPC3, GREM1, HOXB13, HRAS, KIT, MAX, MEN1, MET, MITF, MLH1, MSH2, MSH3, MSH6, MUTYH, NBN, NF1, NF2, NTHL1, PALB2, PDGFRA, PHOX2B, PMS2, POLD1, POLE, POT1, PRKAR1A, PTCH1, PTEN, RAD50, RAD51C, RAD51D, RB1, RECQL4, RET, RUNX1, SDHA, SDHAF2, SDHB, SDHC, SDHD, SMAD4, SMARCA4, SMARCB1, SMARCE1, STK11, SUFU, TERC, TERT, TMEM127, TP53, TSC1, TSC2, VHL, WRN, WT1).  (2) status post right lumpectomy 02/19/2018 showing ductal carcinoma in situ, high-grade, measuring 3.5 cm, with negative margins.  (3) adjuvant radiation 03/30/2018 to 04/27/2018 Site/dose:   The patient received 40.05Gy in 46fractions to the right breast with a 10 Gy boost in 5 fractions  (4) started tamoxifen 08/31/2018, discontinued February 2876 after (embolic?) stroke  (a) bone density 02/12/2012 had a T score of -0.6, which is normal   PLAN: Crystal Trevino is now a little over 3 years out from definitive surgery for her breast cancer with no evidence of disease recurrence.  This is very favorable.  We normally do antiestrogens for 5 years but she understands that antiestrogens in the setting of noninvasive breast cancer are optional.  We discussed anastrozole and she has a good understanding of the possible toxicities side effects and complications of this agent which compared with placebo does not increase the risk of clotting.  Nevertheless given the concern regarding osteoporosis we opted for observation alone.  She will have mammography late May of this year and again next year and she will see my partner Dr. Sonny Dandy at least 1 time.  That will be a year from now and at that point very likely she will "graduate" from follow-up  She knows to call for any other issue that may develop before that visit  Total encounter time 35 minutes.*   Deni Berti, Virgie Dad, MD  04/01/21 9:02 AM Medical Oncology and Hematology [facility] [place], [place] [address] Tel. [phone]    Fax. [phone]   I, Wilburn Mylar, am acting as scribe for Dr.  Virgie Dad. Cythnia Osmun.  I, Lurline Del MD, have reviewed the above documentation for accuracy and completeness, and I agree with the above.   *Total Encounter Time as defined by the Centers for Medicare and Medicaid Services includes, in addition to the face-to-face time of a patient visit (documented in the note above) non-face-to-face time: obtaining and reviewing outside history, ordering and reviewing medications, tests or procedures, care coordination (communications with other health care professionals or caregivers) and documentation in the medical record.

## 2021-04-01 ENCOUNTER — Other Ambulatory Visit: Payer: Self-pay

## 2021-04-01 ENCOUNTER — Inpatient Hospital Stay: Payer: Federal, State, Local not specified - PPO | Attending: Oncology | Admitting: Oncology

## 2021-04-01 VITALS — BP 141/54 | HR 77 | Temp 97.5°F | Resp 18 | Ht 66.0 in | Wt 165.7 lb

## 2021-04-01 DIAGNOSIS — Z8673 Personal history of transient ischemic attack (TIA), and cerebral infarction without residual deficits: Secondary | ICD-10-CM | POA: Insufficient documentation

## 2021-04-01 DIAGNOSIS — Z853 Personal history of malignant neoplasm of breast: Secondary | ICD-10-CM | POA: Diagnosis present

## 2021-04-01 DIAGNOSIS — E039 Hypothyroidism, unspecified: Secondary | ICD-10-CM | POA: Diagnosis not present

## 2021-04-01 DIAGNOSIS — M1711 Unilateral primary osteoarthritis, right knee: Secondary | ICD-10-CM | POA: Insufficient documentation

## 2021-04-01 DIAGNOSIS — Z79899 Other long term (current) drug therapy: Secondary | ICD-10-CM | POA: Insufficient documentation

## 2021-04-01 DIAGNOSIS — D0511 Intraductal carcinoma in situ of right breast: Secondary | ICD-10-CM

## 2021-04-01 DIAGNOSIS — Z7982 Long term (current) use of aspirin: Secondary | ICD-10-CM | POA: Diagnosis not present

## 2021-04-01 DIAGNOSIS — Z803 Family history of malignant neoplasm of breast: Secondary | ICD-10-CM | POA: Insufficient documentation

## 2021-04-01 DIAGNOSIS — I10 Essential (primary) hypertension: Secondary | ICD-10-CM | POA: Diagnosis not present

## 2021-04-03 ENCOUNTER — Telehealth: Payer: Self-pay | Admitting: Oncology

## 2021-04-03 NOTE — Telephone Encounter (Signed)
Scheduled per 5/2 los. Called and spoke with pt confirmed  6/22 appt

## 2021-04-03 NOTE — Progress Notes (Signed)
[facility] Summary Report / Loop Recorder 

## 2021-04-22 ENCOUNTER — Ambulatory Visit (INDEPENDENT_AMBULATORY_CARE_PROVIDER_SITE_OTHER): Payer: Federal, State, Local not specified - PPO

## 2021-04-22 DIAGNOSIS — I639 Cerebral infarction, unspecified: Secondary | ICD-10-CM | POA: Diagnosis not present

## 2021-04-23 LAB — CUP PACEART REMOTE DEVICE CHECK
Date Time Interrogation Session: 20220521160332
Implantable Pulse Generator Implant Date: 20220210

## 2021-05-13 NOTE — Progress Notes (Signed)
[facility] Summary Report / Loop Recorder 

## 2021-05-16 ENCOUNTER — Encounter: Payer: Self-pay | Admitting: Oncology

## 2021-05-19 ENCOUNTER — Emergency Department (HOSPITAL_BASED_OUTPATIENT_CLINIC_OR_DEPARTMENT_OTHER)
Admission: EM | Admit: 2021-05-19 | Discharge: 2021-05-20 | Disposition: A | Discharge status: Home / Self Care | Payer: Federal, State, Local not specified - PPO | Attending: Emergency Medicine | Admitting: Emergency Medicine

## 2021-05-19 ENCOUNTER — Encounter (HOSPITAL_BASED_OUTPATIENT_CLINIC_OR_DEPARTMENT_OTHER): Payer: Self-pay

## 2021-05-19 ENCOUNTER — Other Ambulatory Visit: Payer: Self-pay

## 2021-05-19 ENCOUNTER — Emergency Department (HOSPITAL_BASED_OUTPATIENT_CLINIC_OR_DEPARTMENT_OTHER): Payer: Federal, State, Local not specified - PPO

## 2021-05-19 DIAGNOSIS — Z96651 Presence of right artificial knee joint: Secondary | ICD-10-CM | POA: Insufficient documentation

## 2021-05-19 DIAGNOSIS — K529 Noninfective gastroenteritis and colitis, unspecified: Secondary | ICD-10-CM | POA: Diagnosis not present

## 2021-05-19 DIAGNOSIS — Z853 Personal history of malignant neoplasm of breast: Secondary | ICD-10-CM | POA: Diagnosis not present

## 2021-05-19 DIAGNOSIS — E039 Hypothyroidism, unspecified: Secondary | ICD-10-CM | POA: Diagnosis not present

## 2021-05-19 DIAGNOSIS — Z7902 Long term (current) use of antithrombotics/antiplatelets: Secondary | ICD-10-CM | POA: Insufficient documentation

## 2021-05-19 DIAGNOSIS — U071 COVID-19: Secondary | ICD-10-CM | POA: Insufficient documentation

## 2021-05-19 DIAGNOSIS — Z79899 Other long term (current) drug therapy: Secondary | ICD-10-CM | POA: Diagnosis not present

## 2021-05-19 DIAGNOSIS — R197 Diarrhea, unspecified: Secondary | ICD-10-CM

## 2021-05-19 DIAGNOSIS — I10 Essential (primary) hypertension: Secondary | ICD-10-CM | POA: Insufficient documentation

## 2021-05-19 DIAGNOSIS — R112 Nausea with vomiting, unspecified: Secondary | ICD-10-CM

## 2021-05-19 DIAGNOSIS — K219 Gastro-esophageal reflux disease without esophagitis: Secondary | ICD-10-CM | POA: Insufficient documentation

## 2021-05-19 LAB — COMPREHENSIVE METABOLIC PANEL
ALT: 17 U/L (ref 0–44)
AST: 17 U/L (ref 15–41)
Albumin: 4.2 g/dL (ref 3.5–5.0)
Alkaline Phosphatase: 54 U/L (ref 38–126)
Anion gap: 12 (ref 5–15)
BUN: 27 mg/dL — ABNORMAL HIGH (ref 8–23)
CO2: 24 mmol/L (ref 22–32)
Calcium: 9.8 mg/dL (ref 8.9–10.3)
Chloride: 104 mmol/L (ref 98–111)
Creatinine, Ser: 0.91 mg/dL (ref 0.44–1.00)
GFR, Estimated: >60 mL/min (ref >60)
Glucose, Bld: 134 mg/dL — ABNORMAL HIGH (ref 70–99)
Potassium: 4 mmol/L (ref 3.5–5.1)
Sodium: 140 mmol/L (ref 135–145)
Total Bilirubin: 1.5 mg/dL — ABNORMAL HIGH (ref 0.3–1.2)
Total Protein: 6.6 g/dL (ref 6.5–8.1)

## 2021-05-19 LAB — CBC
HCT: 37 % (ref 36.0–46.0)
Hemoglobin: 12.2 g/dL (ref 12.0–15.0)
MCH: 30.4 pg (ref 26.0–34.0)
MCHC: 33 g/dL (ref 30.0–36.0)
MCV: 92.3 fL (ref 80.0–100.0)
Platelets: 216 10*3/uL (ref 150–400)
RBC: 4.01 MIL/uL (ref 3.87–5.11)
RDW: 14.3 % (ref 11.5–15.5)
WBC: 9.5 10*3/uL (ref 4.0–10.5)
nRBC: 0 % (ref 0.0–0.2)

## 2021-05-19 LAB — LIPASE, BLOOD: Lipase: 26 U/L (ref 11–51)

## 2021-05-19 MED ORDER — ONDANSETRON HCL 4 MG/2ML IJ SOLN
4.0000 mg | Freq: Once | INTRAMUSCULAR | Status: AC
  Administered 2021-05-19: 4 mg via INTRAVENOUS
  Filled 2021-05-19: qty 2

## 2021-05-19 MED ORDER — DICYCLOMINE HCL 10 MG/ML IM SOLN
20.0000 mg | Freq: Once | INTRAMUSCULAR | Status: AC
  Administered 2021-05-19: 20 mg via INTRAMUSCULAR
  Filled 2021-05-19: qty 2

## 2021-05-19 MED ORDER — IOHEXOL 9 MG/ML PO SOLN
500.0000 mL | Freq: Once | ORAL | Status: AC
  Administered 2021-05-19: 500 mL via ORAL

## 2021-05-19 MED ORDER — IOHEXOL 300 MG/ML  SOLN
75.0000 mL | Freq: Once | INTRAMUSCULAR | Status: AC | PRN
  Administered 2021-05-19: 75 mL via INTRAVENOUS

## 2021-05-19 MED ORDER — LACTATED RINGERS IV BOLUS
1000.0000 mL | Freq: Once | INTRAVENOUS | Status: AC
  Administered 2021-05-19: 1000 mL via INTRAVENOUS

## 2021-05-19 NOTE — ED Triage Notes (Signed)
Pt states she has been  having lower abd pain that started today and  also nausea / diarrhea

## 2021-05-19 NOTE — ED Provider Notes (Signed)
[facility] Provider Note   CSN: [id number] Arrival date & time: 05/19/21  1847     History Chief Complaint  Patient presents with   Abdominal Pain   Diarrhea   Emesis    Crystal Trevino is a 80 y.o. female.  HPI     81 year old female with a history of breast cancer, peripheral vascular disease, stable splenic artery aneurysm 1.5 cm without further surveillance recommended in 2014, hypertension, hyperlipidemia, CVA February 2022 who presents with concern for abdominal pain, nausea, vomiting and diarrhea.  Reports that she was out eating lunch, when she developed several episodes of emesis during lunch, and repeated this when she got home.  Reports she began having diarrhea.  Denies black or bloody stools.  Reports she had 7-8 episodes of vomiting and 7-8 episodes of diarrhea today.  She has had right lower quadrant abdominal pain feels like a shooting in her right lower quadrant.  Denies known fevers.  No recent antibiotics.  No known sick contacts.  Denies feeling lightheaded.  Past Medical History:  Diagnosis Date   Arthritis    Breast CA ([place])    right breast cancer   Cancer ([place])    HAD RIGHT BREAST BX --GOING [facility]ED 12/30/2017   Constipation 08/13/2011   Dizziness 08/13/2011   Family history of adverse reaction to anesthesia    pt mother had PONV   Family history of breast cancer    Genetic testing 01/28/2018   Multi-Cancer panel (83 genes) @ [facility] - No pathogenic mutations detected   GERD (gastroesophageal reflux disease)    Heart murmur    "SLIGHT HEART MURMUR"  WAS PICKED UP AS AN ADULT   Hyperlipidemia    Hypertension    Hypothyroidism    Osteoarthritis of knee    Right   Peripheral vascular disease ([place])    Reflux    Splenic artery aneurysm ([facility])    stable 1.5cm by 08/2013 CT; no further surveillance recommended   Weight loss     Patient Active Problem List   Diagnosis Date Noted   Normocytic anemia  01/08/2021   Acute CVA (cerebrovascular accident) ([place]) 01/07/2021   Pre-diabetes 01/07/2021   Essential hypertension 01/06/2021   Hyperlipidemia 01/06/2021   Hypothyroidism 01/06/2021   Genetic testing 01/28/2018   S/P total knee replacement 01/04/2018   Family history of breast cancer    Ductal carcinoma in situ (DCIS) of right breast 12/23/2017   Aneurysm of splenic artery ([place]) 08/23/2012    Past Surgical History:  Procedure Laterality Date   ABDOMINAL HYSTERECTOMY     BACK SURGERY     BREAST LUMPECTOMY WITH RADIOACTIVE SEED LOCALIZATION Right 02/19/2018   Procedure: BREAST LUMPECTOMY WITH RADIOACTIVE SEED LOCALIZATION. RIGHT;  Surgeon: Alphonsa Overall, MD;  Location: [facility];  Service: General;  Laterality: Right;   BREAST SURGERY     CHOLECYSTECTOMY     KNEE SURGERY     LOOP RECORDER INSERTION N/A 01/10/2021   Procedure: LOOP RECORDER INSERTION;  Surgeon: Constance Haw, MD;  Location: [facility];  Service: Cardiovascular;  Laterality: N/A;   LUMBAR LAMINECTOMY     TOTAL KNEE ARTHROPLASTY Right 01/04/2018   TOTAL KNEE ARTHROPLASTY Right 01/04/2018   Procedure: TOTAL KNEE ARTHROPLASTY;  Surgeon: Vickey Huger, MD;  Location: [facility];  Service: Orthopedics;  Laterality: Right;   VAGINAL PROLAPSE REPAIR     2015   DONE IN [facility]   VAGINAL PROLAPSE REPAIR  05/20/2018  OB History   No obstetric history on file.     Family History  Problem Relation Age of Onset   Heart disease Mother    Breast cancer Mother 64       deceased 11; TAH/BSO (age?)   Heart disease Father    Breast cancer Daughter        dx 16s; currenlty 53; Neg BRCA1/BRCA2   Breast cancer Maternal Aunt 44       deceased 20    Social History   Tobacco Use   Smoking status: Never   Smokeless tobacco: Never  Vaping Use   Vaping Use: Never used  Substance Use Topics   Alcohol use: No   Drug use: No    Home Medications Prior to Admission medications   Medication Sig Start Date End Date  Taking? Authorizing Provider  dicyclomine (BENTYL) 20 MG tablet Take 1 tablet (20 mg total) by mouth 3 (three) times daily as needed for spasms. 05/20/21  Yes Gareth Morgan, MD  ondansetron (ZOFRAN) 4 MG tablet Take 1 tablet (4 mg total) by mouth every 6 (six) hours. 05/20/21  Yes Gareth Morgan, MD  carvedilol (COREG) 12.5 MG tablet Take 12.5 mg by mouth 2 (two) times daily with a meal.    [provider]  clopidogrel (PLAVIX) 75 MG tablet Take 75 mg by mouth daily.    [provider]  fluticasone (FLONASE) 50 MCG/ACT nasal spray Place 1 spray into both nostrils daily as needed for allergies or rhinitis.    [provider]  gabapentin (NEURONTIN) 300 MG capsule Take 1 capsule (300 mg total) by mouth at bedtime. 03/13/20   Magrinat, Virgie Dad, MD  levothyroxine (SYNTHROID, LEVOTHROID) 75 MCG tablet Take 75 mcg by mouth daily before breakfast.    [provider]  Multiple Vitamins-Minerals (CENTRUM SILVER) tablet Take 1 tablet by mouth daily.    [provider]  senna-docusate (SENOKOT-S) 8.6-50 MG tablet Take 1 tablet by mouth at bedtime as needed for mild constipation. 01/11/21   Donne Hazel, MD  simvastatin (ZOCOR) 20 MG tablet Take 20 mg by mouth at bedtime.    [provider]    Allergies    Penicillins  Review of Systems   Review of Systems  Constitutional:  Negative for fever.  HENT:  Negative for sore throat.   Respiratory:  Negative for cough and shortness of breath.   Cardiovascular:  Negative for chest pain.  Gastrointestinal:  Positive for abdominal pain, diarrhea, nausea and vomiting.  Genitourinary:  Negative for difficulty urinating.  Musculoskeletal:  Negative for back pain and neck pain.  Skin:  Negative for rash.  Neurological:  Negative for syncope and headaches.   Physical Exam Updated Vital Signs BP 132/79   Pulse 86   Temp 98.8 F (37.1 C)   Resp 18   Ht _0  (1.626 m)   Wt 72.6 kg   SpO2 99%   BMI  27.46 kg/m   Physical Exam Vitals and nursing note reviewed.  Constitutional:      General: She is not in acute distress.    Appearance: She is well-developed. She is not diaphoretic.  HENT:     Head: Normocephalic and atraumatic.  Eyes:     Conjunctiva/sclera: Conjunctivae normal.  Cardiovascular:     Rate and Rhythm: Normal rate and regular rhythm.     Heart sounds: Normal heart sounds. No murmur heard.   No friction rub. No gallop.  Pulmonary:  Effort: Pulmonary effort is normal. No respiratory distress.     Breath sounds: Normal breath sounds. No wheezing or rales.  Abdominal:     General: There is no distension.     Palpations: Abdomen is soft.     Tenderness: There is abdominal tenderness in the right lower quadrant. There is no guarding.  Musculoskeletal:        General: No tenderness.     Cervical back: Normal range of motion.  Skin:    General: Skin is warm and dry.     Findings: No erythema or rash.  Neurological:     Mental Status: She is alert and oriented to person, place, and time.    ED Results / Procedures / Treatments   Labs (all labs ordered are listed, but only abnormal results are displayed) Labs Reviewed  COMPREHENSIVE METABOLIC PANEL - Abnormal; Notable for the following components:      Result Value   Glucose, Bld 134 (*)    BUN 27 (*)    Total Bilirubin 1.5 (*)    All other components within normal limits  GASTROINTESTINAL PANEL BY PCR, STOOL (REPLACES STOOL CULTURE)  SARS CORONAVIRUS 2 (TAT 6-24 HRS)  LIPASE, BLOOD  CBC  URINALYSIS, ROUTINE W REFLEX MICROSCOPIC    EKG None  Radiology CT ABDOMEN PELVIS W CONTRAST  Result Date: 05/19/2021 CLINICAL DATA:  Right lower quadrant pain for 1 day EXAM: CT ABDOMEN AND PELVIS WITH CONTRAST TECHNIQUE: Multidetector CT imaging of the abdomen and pelvis was performed using the standard protocol following bolus administration of intravenous contrast. CONTRAST:  22m OMNIPAQUE IOHEXOL 300 MG/ML   SOLN COMPARISON:  09/13/2013 FINDINGS: Lower chest: No acute abnormality. Hepatobiliary: No focal liver abnormality is seen. Status post cholecystectomy. No biliary dilatation. Pancreas: Unremarkable. No pancreatic ductal dilatation or surrounding inflammatory changes. Spleen: Normal in size without focal abnormality. Adrenals/Urinary Tract: Adrenal glands are within normal limits. Kidneys demonstrate a normal enhancement pattern bilaterally. No renal calculi are seen. No obstructive changes are noted. The ureters are within normal limits to the bladder. Bladder is partially distended. Delayed images demonstrate normal excretion. Stomach/Bowel: Colon shows diverticular change without evidence of diverticulitis. No obstructive or inflammatory changes are noted. The appendix is well visualized and within normal limits. No inflammatory changes are seen. Some mild prominence in the cecum adjacent to the ileocecal valve is noted although may simply be related to incomplete distension. Small-bowel and stomach appear within normal limits with the exception of a small sliding-type hiatal hernia. Vascular/Lymphatic: Aortic atherosclerosis. No enlarged abdominal or pelvic lymph nodes. Reproductive: Status post hysterectomy. No adnexal masses. Other: No abdominal wall hernia or abnormality. No abdominopelvic ascites. Musculoskeletal: Stable bone island is noted in the left sacrum. Degenerative changes of lumbar spine are noted. IMPRESSION: Diverticular change without diverticulitis. Prominence of tissue density in the cecum which is likely related to incomplete distension. This can be correlated with subsequent colonoscopy. No other focal abnormality is noted. Electronically Signed   By: MInez CatalinaM.D.   On: 05/19/2021 23:25    Procedures Procedures   Medications Ordered in ED Medications  dicyclomine (BENTYL) injection 20 mg (20 mg Intramuscular Given 05/19/21 2211)  ondansetron (ZOFRAN) injection 4 mg (4 mg  Intravenous Given 05/19/21 2211)  lactated ringers bolus 1,000 mL (1,000 mLs Intravenous New Bag/Given 05/19/21 2209)  iohexol (OMNIPAQUE) 9 MG/ML oral solution 500 mL (500 mLs Oral Contrast Given 05/19/21 2206)  iohexol (OMNIPAQUE) 300 MG/ML solution 75 mL (75 mLs Intravenous Contrast Given 05/19/21 2252)  ED Course  I have reviewed the triage vital signs and the nursing notes.  Pertinent labs & imaging results that were available during my care of the patient were reviewed by me and considered in my medical decision making (see chart for details).    MDM Rules/Calculators/A&P                           80 year old female with a history of breast cancer, peripheral vascular disease, stable splenic artery aneurysm 1.5 cm without further surveillance recommended in 2014, hypertension, hyperlipidemia, CVA February 2022 who presents with concern for abdominal pain, nausea, vomiting and diarrhea.  Labs show no evidence of pancreatitis or hepatitis.  No sign of AKI or significant electrolyte abnormality.  No anemia.  Doubt urinary tract infection in setting of significant diarrhea and no urinary symptoms.  Given location of pain, commendation of symptoms, low suspicion that this represents splenic artery aneurysm complication.  CT abdomen pelvis completed showing diverticular change without diverticulitis, prominence of tissue density in the cecum which is likely related to incomplete distention but can be correlated with colonoscopy.  Suspect likely viral gastroenteritis as etiology of symptoms.  She is hemodynamically stable, tolerating p.o., and appropriate for outpatient follow-up.  Given prescription for Zofran and Bentyl. COVID testing ordered. Patient discharged in stable condition with understanding of reasons to return.   Final Clinical Impression(s) / ED Diagnoses Final diagnoses:  Nausea vomiting and diarrhea  Gastroenteritis    Rx / DC Orders ED Discharge Orders          Ordered     ondansetron (ZOFRAN) 4 MG tablet  Every 6 hours        05/20/21 0025    dicyclomine (BENTYL) 20 MG tablet  3 times daily PRN        05/20/21 Wonda Amis, MD 05/20/21 [phone]

## 2021-05-20 LAB — SARS CORONAVIRUS 2 (TAT 6-24 HRS): SARS Coronavirus 2: POSITIVE — AB

## 2021-05-20 MED ORDER — ONDANSETRON HCL 4 MG PO TABS: 4.0000 mg | ORAL_TABLET | Freq: Four times a day (QID) | ORAL | 0 refills | Status: DC

## 2021-05-20 MED ORDER — DICYCLOMINE HCL 20 MG PO TABS: 20.0000 mg | ORAL_TABLET | Freq: Three times a day (TID) | ORAL | 0 refills | Status: DC | PRN

## 2021-05-24 LAB — CUP PACEART REMOTE DEVICE CHECK
Date Time Interrogation Session: 20220623160636
Implantable Pulse Generator Implant Date: 20220210

## 2021-05-27 ENCOUNTER — Ambulatory Visit (INDEPENDENT_AMBULATORY_CARE_PROVIDER_SITE_OTHER): Payer: Federal, State, Local not specified - PPO

## 2021-05-27 DIAGNOSIS — I639 Cerebral infarction, unspecified: Secondary | ICD-10-CM

## 2021-05-30 ENCOUNTER — Encounter: Payer: Self-pay | Admitting: Adult Health

## 2021-05-30 ENCOUNTER — Ambulatory Visit: Payer: Federal, State, Local not specified - PPO | Admitting: Adult Health

## 2021-05-30 VITALS — BP 132/78 | HR 76 | Ht 64.0 in | Wt 167.0 lb

## 2021-05-30 DIAGNOSIS — I10 Essential (primary) hypertension: Secondary | ICD-10-CM

## 2021-05-30 DIAGNOSIS — I639 Cerebral infarction, unspecified: Secondary | ICD-10-CM

## 2021-05-30 DIAGNOSIS — E785 Hyperlipidemia, unspecified: Secondary | ICD-10-CM | POA: Diagnosis not present

## 2021-05-30 NOTE — Patient Instructions (Signed)
Continue doing exercises at home as you currently are doing - okay to slowly return back to driving on a graduated return to driving basis as below Graduated return to driving as recommended.  It is recommended that you first drive with another licensed driver in an empty parking lot. If you do well with this, you can drive on a quiet street with the licensed driver.  If you do well with this, you can drive on a busy street with a licensed driver.  If you continue to do well, you can be cleared to drive independently.  For the first month after resuming driving, it is recommend no nighttime, busy/heavy traffic roads or Interstate driving.   Continue clopidogrel 75 mg daily  and simvastatin  for secondary stroke prevention  Continue to follow up with PCP regarding cholesterol and blood pressure management  Maintain strict control of hypertension with blood pressure goal below 130/90 and cholesterol with LDL cholesterol (bad cholesterol) goal below 70 mg/dL.       Followup in the future with me in 6 months or call earlier if needed       Thank you for coming to see [place] at [facility]. I hope we have been able to provide you high quality care today.  You may receive a patient satisfaction survey over the next few weeks. We would appreciate your feedback and comments so that we may continue to improve ourselves and the health of our patients.

## 2021-05-30 NOTE — Progress Notes (Signed)
[facility] [address] [place]. [place] [address] [phone]       STROKE FOLLOW UP NOTE  Ms. Crystal Trevino Date of Birth:  09-22-1941 Medical Record Number:  [id number]   Reason for Referral: stroke follow up    SUBJECTIVE:   CHIEF COMPLAINT:  Chief Complaint  Patient presents with   Follow-up    RM 73 with daughter Crystal Trevino Pt is well, everything is improving     HPI:   Today, 05/30/2021, Crystal Trevino returns for 30-month stroke follow-up accompanied by her daughter, Crystal Trevino.  She has been doing well since prior visit without new stroke/TIA symptoms.  Prior concerns of cognitive difficulties greatly improving per both patient and daughter.  She has since completed therapies and continues to do HEP at home as recommended as well as memory exercises. She no longer needs 24 hr supervision but family checks on her routinely. Daughter did have her practice driving in a empty parking lot which she did without difficulty. Reports compliance on Plavix and simvastatin without associated side effects.  Blood pressure today initially elevated but on recheck 132/78.  Loop recorder has not shown atrial fibrillation thus far.  She recently presented to the ED on 6/19 for nausea and vomiting and was found to be COVID-positive but since recovered well without prolonged symptoms.  No further concerns at this time.    History provided for reference purposes only Initial visit 02/14/2021 JM: Crystal Trevino is being seen for hospital follow-up accompanied by her daughter, Crystal Trevino  Per patient, she has recovered well without residual deficit. Per daughter, she continues to have some cognitive difficulty such as difficulty with multitasking, decreased attention and losing objects easily.  She does report gradual improvement especially since discharge.  Family continues to provide 24-hour supervision -patient questions ongoing need -she is able to maintain ADLs independently but does receive  assistance for IADLs -daughter more concerned with safety if she is left alone.  Previously working with SLP but discharged on 3/8 per patient request as she believes she was at baseline - per SLP note, remaining deficits mild cognitive deficits related to executive functioning, attention and recall with decreased awareness Continues working with PT/OT for gait unsteadiness, decreased attention, impulsivity and decreased scanning on right side Denies new or worsening stroke/TIA symptoms  Completed 3 weeks DAPT but apparently has continued on both aspirin and Plavix - daughter questions this as she does not believe she has remained on aspirin but patient reports she has -denies side effects Compliant on simvastatin -denies side effects  blood pressure today 106/62 -monitors at home and typically ranges 100-140/70-80s  Loop recorder has not shown atrial fibrillation thus far  No further concerns at this time   Stroke admission 01/06/2021 Crystal Trevino is a 80 y.o. female with history of  HLD, HTN, PVD, breast cancer s/p lumpectomy and XRT 2019 and hypothyroidism who presented to [facility] ED on 01/06/2021 with slurred speech, difficulty finding her words, and right facial droop.  Personally reviewed hospitalization pertinent progress notes, lab work and imaging with summary provided.  Evaluated by Dr. Erlinda Hong with stroke work-up revealing left MCA infarcts, embolic secondary to unknown source.  Placement of loop recorder to assess for A. fib as potential etiology.  DAPT for 3 weeks and Plavix alone as on aspirin PTA.  LDL 68.  A1c 6.0.  Other stroke risk factors include advanced age and PVD but no prior stroke history.  Evaluated by therapies and recommended inpatient rehab for  ongoing therapy needs  Stroke:  left MCA infarcts embolic secondary to unknown source, concerning for embolic.  Code Stroke CT head No acute abnormality. ASPECTS 10.    CTA head & neck no LVO MRI  L frontal opercular and L parietal  infarct; distal L MCA M3 occlusion   Repeat CT head  (01/07/21) showed acute infarct of left frontal lobe with developing hypodensity but no hemorrhage. LE Doppler  No DVT 2D Echo- LVEF 60 to 65%, mild to moderate AVR and mild MVR S/p ILR 2/10 LDL 68 HgbA1c 6.0 VTE prophylaxis - Lovenox 40 mg sq daily  aspirin 81 mg daily prior to admission, now on aspirin 81 mg daily and clopidogrel 75 mg daily. Continue DAPT x 21 days then plavix alone Therapy recommendations:  CIR --> HPIR Disposition:  HPIR      ROS:   14 system review of systems performed and negative with exception of those listed in HPI  PMH:  Past Medical History:  Diagnosis Date   Arthritis    Breast CA ([place])    right breast cancer   Cancer ([place])    HAD RIGHT BREAST BX --GOING TO [facility] ON WED 12/30/2017   Constipation 08/13/2011   Dizziness 08/13/2011   Family history of adverse reaction to anesthesia    pt mother had PONV   Family history of breast cancer    Genetic testing 01/28/2018   Multi-Cancer panel (83 genes) @ [facility] - No pathogenic mutations detected   GERD (gastroesophageal reflux disease)    Heart murmur    "SLIGHT HEART MURMUR"  WAS PICKED UP AS AN ADULT   Hyperlipidemia    Hypertension    Hypothyroidism    Osteoarthritis of knee    Right   Peripheral vascular disease ([place])    Reflux    Splenic artery aneurysm ([facility])    stable 1.5cm by 08/2013 CT; no further surveillance recommended   Weight loss     PSH:  Past Surgical History:  Procedure Laterality Date   ABDOMINAL HYSTERECTOMY     BACK SURGERY     BREAST LUMPECTOMY WITH RADIOACTIVE SEED LOCALIZATION Right 02/19/2018   Procedure: BREAST LUMPECTOMY WITH RADIOACTIVE SEED LOCALIZATION. RIGHT;  Surgeon: Alphonsa Overall, MD;  Location: [facility];  Service: General;  Laterality: Right;   BREAST SURGERY     CHOLECYSTECTOMY     KNEE SURGERY     LOOP RECORDER INSERTION N/A 01/10/2021   Procedure: LOOP RECORDER INSERTION;  Surgeon: Constance Haw, MD;  Location: [facility];  Service: Cardiovascular;  Laterality: N/A;   LUMBAR LAMINECTOMY     TOTAL KNEE ARTHROPLASTY Right 01/04/2018   TOTAL KNEE ARTHROPLASTY Right 01/04/2018   Procedure: TOTAL KNEE ARTHROPLASTY;  Surgeon: Vickey Huger, MD;  Location: [facility];  Service: Orthopedics;  Laterality: Right;   VAGINAL PROLAPSE REPAIR     2015   DONE IN [place]   VAGINAL PROLAPSE REPAIR  05/20/2018    Social History:  Social History   Socioeconomic History   Marital status: Legally Separated    Spouse name: Not on file   Number of children: Not on file   Years of education: Not on file   Highest education level: Not on file  Occupational History   Not on file  Tobacco Use   Smoking status: Never   Smokeless tobacco: Never  Vaping Use   Vaping Use: Never used  Substance and Sexual Activity   Alcohol use: No   Drug use: No  Sexual activity: Not on file  Other Topics Concern   Not on file  Social History Narrative   Not on file   Social Determinants of Health   Financial Resource Strain: Not on file  Food Insecurity: Not on file  Transportation Needs: Not on file  Physical Activity: Not on file  Stress: Not on file  Social Connections: Not on file  Intimate Partner Violence: Not on file    Family History:  Family History  Problem Relation Age of Onset   Heart disease Mother    Breast cancer Mother 65       deceased 66; TAH/BSO (age?)   Heart disease Father    Breast cancer Daughter        dx 79s; currenlty 63; Neg BRCA1/BRCA2   Breast cancer Maternal Aunt 44       deceased 80    Medications:   Current Outpatient Medications on File Prior to Visit  Medication Sig Dispense Refill   carvedilol (COREG) 12.5 MG tablet Take 12.5 mg by mouth 2 (two) times daily with a meal.     clopidogrel (PLAVIX) 75 MG tablet Take 75 mg by mouth daily.     fluticasone (FLONASE) 50 MCG/ACT nasal spray Place 1 spray into both nostrils daily as needed for allergies or  rhinitis.     gabapentin (NEURONTIN) 300 MG capsule Take 1 capsule (300 mg total) by mouth at bedtime. 90 capsule 4   levothyroxine (SYNTHROID, LEVOTHROID) 75 MCG tablet Take 75 mcg by mouth daily before breakfast.     Prenatal Vit-Fe Fumarate-FA (PRENATAL MULTIVITAMIN) TABS tablet Take 1 tablet by mouth daily at 12 noon.     simvastatin (ZOCOR) 20 MG tablet Take 20 mg by mouth at bedtime.     No current facility-administered medications on file prior to visit.    Allergies:   Allergies  Allergen Reactions   Penicillins Nausea And Vomiting, Rash and Other (See Comments)    PATIENT HAS HAD A PCN REACTION WITH IMMEDIATE RASH, FACIAL/TONGUE/THROAT SWELLING, SOB, OR LIGHTHEADEDNESS WITH HYPOTENSION:  #  #  #  YES  #  #  #   Has patient had a PCN reaction causing severe rash involving mucus membranes or skin necrosis: No Has patient had a PCN reaction that required hospitalization: No Has patient had a PCN reaction occurring within the last 10 years: No       OBJECTIVE:  Physical Exam  Vitals:   05/30/21 1441  BP: 132/78  Pulse: 76  Weight: 167 lb (75.8 kg)  Height: $Remove'5\' 4"'[id number]$  (1.626 m)   Body mass index is 28.67 kg/m. No results found.  General: well developed, well nourished,  pleasant elderly Caucasian female, seated, in no evident distress Head: head normocephalic and atraumatic.   Neck: supple with no carotid or supraclavicular bruits Cardiovascular: regular rate and rhythm, no murmurs Musculoskeletal: no deformity Skin:  no rash/petichiae Vascular:  Normal pulses all extremities   Neurologic Exam Mental Status: Awake and fully alert.   Fluent speech and language.  Oriented to place and time. Recent and remote memory intact. Attention span, concentration and fund of knowledge intact during visit. Cranial Nerves: Pupils equal, briskly reactive to light. Extraocular movements full without nystagmus. Visual fields full to confrontation. Hearing intact. Facial sensation intact.  Face, tongue, palate moves normally and symmetrically.  Motor: Normal bulk and tone. Normal strength in all tested extremity muscles Sensory.: intact to touch , pinprick , position and vibratory sensation.  Coordination: Rapid alternating movements  normal in all extremities. Finger-to-nose and heel-to-shin performed accurately bilaterally. Gait and Station: Arises from chair without difficulty. Stance is normal. Gait demonstrates normal stride length and balance without use of assistive device.   Reflexes: 1+ and symmetric. Toes downgoing.         ASSESSMENT: Crystal Trevino is a 80 y.o. year old female presented with slurred speech, word finding difficulty and right facial droop on 01/06/2021 with stroke work-up revealing left MCA infarcts (L frontal opercular and L parietal) with distal left MCA M3 occlusion, embolic secondary to unknown source s/p ILR. Vascular risk factors include HTN, HLD, PVD and advanced age.      PLAN:  L MCA stroke, cryptogenic:  Residual deficit: Recovered well with only mild residual cognitive impairment  Continue to do HEP at home as instructed at the end of therapy sessions Okay to return to driving on a graduated return to driving basis which was fully discussed with both patient and daughter with details provided.  She only drives short distance around her home and does not drive on main roads or highways nor nighttime driving.  Advised daughter if there are any concerns regarding her driving, it will be recommended for her to pursue driving rehab but at this present time, I do not believe that is necessary Loop recorder has not shown atrial fibrillation thus far - will continue to be monitored by cardiology Continue clopidogrel 75 mg daily  and simvastatin 20 mg daily for secondary stroke prevention.   Discussed secondary stroke prevention measures and importance of close PCP follow up for aggressive stroke risk factor management  HTN: BP goal <130/90.   Stable on carvedilol per PCP HLD: LDL goal <70.  Prior LDL 68 on simvastatin 20 mg daily.    Follow up in 6 months or call earlier if needed   CC:  [facility] provider: Dr. Leonie Man PCP: Gwyndolyn Kaufman, PA-C    I spent 36 minutes of face-to-face and non-face-to-face time with patient and daughter.  This included previsit chart review, lab review, study review, order entry, electronic health record documentation, patient and daughter education and discussion regarding prior stroke and secondary stroke prevention and importance of aggressive stroke risk factor management, review of loop recorder, residual deficits and further recovery and answered all other questions to patient and daughters satisfaction   Frann Rider, [facility]  [facility] [address] [place], [place] [address]  Phone [phone] Fax [phone] Note: This document was prepared with digital dictation and possible smart phrase technology. Any transcriptional errors that result from this process are unintentional.

## 2021-05-31 NOTE — Progress Notes (Signed)
I agree with the above plan 

## 2021-06-13 NOTE — Progress Notes (Signed)
[facility] Summary Report / Loop Recorder 

## 2021-06-27 ENCOUNTER — Ambulatory Visit (INDEPENDENT_AMBULATORY_CARE_PROVIDER_SITE_OTHER): Payer: Federal, State, Local not specified - PPO

## 2021-06-27 DIAGNOSIS — I639 Cerebral infarction, unspecified: Secondary | ICD-10-CM | POA: Diagnosis not present

## 2021-06-28 LAB — CUP PACEART REMOTE DEVICE CHECK
Date Time Interrogation Session: 20220726160251
Implantable Pulse Generator Implant Date: 20220210

## 2021-07-23 NOTE — Progress Notes (Signed)
[facility] Summary Report / Loop Recorder 

## 2021-07-30 ENCOUNTER — Ambulatory Visit (INDEPENDENT_AMBULATORY_CARE_PROVIDER_SITE_OTHER): Payer: Federal, State, Local not specified - PPO

## 2021-07-30 DIAGNOSIS — I639 Cerebral infarction, unspecified: Secondary | ICD-10-CM | POA: Diagnosis not present

## 2021-07-30 LAB — CUP PACEART REMOTE DEVICE CHECK
Date Time Interrogation Session: 20220828160520
Implantable Pulse Generator Implant Date: 20220210

## 2021-08-12 NOTE — Progress Notes (Signed)
[facility] Summary Report / Loop Recorder 

## 2021-09-02 ENCOUNTER — Ambulatory Visit (INDEPENDENT_AMBULATORY_CARE_PROVIDER_SITE_OTHER): Payer: Federal, State, Local not specified - PPO

## 2021-09-02 DIAGNOSIS — I639 Cerebral infarction, unspecified: Secondary | ICD-10-CM | POA: Diagnosis not present

## 2021-09-02 LAB — CUP PACEART REMOTE DEVICE CHECK
Date Time Interrogation Session: 20220930160609
Implantable Pulse Generator Implant Date: 20220210

## 2021-09-09 NOTE — Progress Notes (Signed)
[facility] Summary Report / Loop Recorder 

## 2021-10-03 LAB — CUP PACEART REMOTE DEVICE CHECK
Date Time Interrogation Session: 20221102160335
Implantable Pulse Generator Implant Date: 20220210

## 2021-10-07 ENCOUNTER — Ambulatory Visit (INDEPENDENT_AMBULATORY_CARE_PROVIDER_SITE_OTHER): Payer: Federal, State, Local not specified - PPO

## 2021-10-07 DIAGNOSIS — I639 Cerebral infarction, unspecified: Secondary | ICD-10-CM

## 2021-10-11 NOTE — Progress Notes (Signed)
[facility] Summary Report / Loop Recorder 

## 2021-11-06 LAB — CUP PACEART REMOTE DEVICE CHECK
Date Time Interrogation Session: 20221205160500
Implantable Pulse Generator Implant Date: 20220210

## 2021-11-11 ENCOUNTER — Ambulatory Visit (INDEPENDENT_AMBULATORY_CARE_PROVIDER_SITE_OTHER): Payer: Federal, State, Local not specified - PPO

## 2021-11-11 DIAGNOSIS — I639 Cerebral infarction, unspecified: Secondary | ICD-10-CM | POA: Diagnosis not present

## 2021-11-19 NOTE — Progress Notes (Signed)
[facility] Summary Report / Loop Recorder 

## 2021-12-12 ENCOUNTER — Other Ambulatory Visit: Payer: Self-pay

## 2021-12-12 ENCOUNTER — Encounter: Payer: Self-pay | Admitting: Adult Health

## 2021-12-12 ENCOUNTER — Ambulatory Visit: Payer: Federal, State, Local not specified - PPO | Admitting: Adult Health

## 2021-12-12 VITALS — BP 155/75 | HR 75 | Ht 64.0 in | Wt 168.0 lb

## 2021-12-12 DIAGNOSIS — I639 Cerebral infarction, unspecified: Secondary | ICD-10-CM

## 2021-12-12 DIAGNOSIS — I69319 Unspecified symptoms and signs involving cognitive functions following cerebral infarction: Secondary | ICD-10-CM | POA: Diagnosis not present

## 2021-12-12 NOTE — Patient Instructions (Addendum)
Continue clopidogrel 75 mg daily  and simvastatin for secondary stroke prevention  Loop recorder has not shown atrial fibrillation thus far - continue to be monitored by cardiology  Continue to follow up with PCP regarding cholesterol and blood pressure management  Maintain strict control of hypertension with blood pressure goal below 130/90 and cholesterol with LDL cholesterol (bad cholesterol) goal below 70 mg/dL.   Signs of a Stroke? Follow the BEFAST method:  Balance Watch for a sudden loss of balance, trouble with coordination or vertigo Eyes Is there a sudden loss of vision in one or both eyes? Or double vision?  Face: Ask the person to smile. Does one side of the face droop or is it numb?  Arms: Ask the person to raise both arms. Does one arm drift downward? Is there weakness or numbness of a leg? Speech: Ask the person to repeat a simple phrase. Does the speech sound slurred/strange? Is the person confused ? Time: If you observe any of these signs, call 911.       Thank you for coming to see [place] at [facility]. I hope we have been able to provide you high quality care today.  You may receive a patient satisfaction survey over the next few weeks. We would appreciate your feedback and comments so that we may continue to improve ourselves and the health of our patients.

## 2021-12-12 NOTE — Progress Notes (Signed)
[facility] [address] [place]. [place] [address] [phone]       STROKE FOLLOW UP NOTE  Ms. Guinevere Scarlet Date of Birth:  04-Jan-1941 Medical Record Number:  [id number]   Reason for Referral: stroke follow up    SUBJECTIVE:   CHIEF COMPLAINT:  Chief Complaint  Patient presents with   Cryptogenic stroke    Rm 3, 6 month FU, dgtr- [name]  "no new concerns"    HPI:   Update, 12/12/2021, JM: Ms Kilbride is here today for a stroke follow-up accompanied by daughter [name]. Reports stable cognitive difficulties, denies new stroke/TIA symptoms. Therapy completed. She has progressed to driving short distances without difficulty. Lives alone - family checks in and assists as needed.  Remains on Plavix and Simvastatin without side effects. BP today 155/75. Labs A1c 6.0, LDL 68 (01/07/2021). No A fib seen on loop recorder thus far. She walks 2.5 miles a day 5 days a week. She is requesting surgical clearance for knee surgery tentatively planned for March. No further concerns at this time.    History provided for reference purposes only Update, 05/30/2021 JM: Ms. Strauser returns for 2-month stroke follow-up accompanied by her daughter, [name].  She has been doing well since prior visit without new stroke/TIA symptoms.  Prior concerns of cognitive difficulties greatly improving per both patient and daughter.  She has since completed therapies and continues to do HEP at home as recommended as well as memory exercises. She no longer needs 24 hr supervision but family checks on her routinely. Daughter did have her practice driving in a empty parking lot which she did without difficulty. Reports compliance on Plavix and simvastatin without associated side effects.  Blood pressure today initially elevated but on recheck 132/78.  Loop recorder has not shown atrial fibrillation thus far.  She recently presented to the ED on 6/19 for nausea and vomiting and was found to be COVID-positive but  since recovered well without prolonged symptoms.  No further concerns at this time.  Initial visit 02/14/2021 JM: Ms. Padmore is being seen for hospital follow-up accompanied by her daughter, [name]  Per patient, she has recovered well without residual deficit. Per daughter, she continues to have some cognitive difficulty such as difficulty with multitasking, decreased attention and losing objects easily.  She does report gradual improvement especially since discharge.  Family continues to provide 24-hour supervision -patient questions ongoing need -she is able to maintain ADLs independently but does receive assistance for IADLs -daughter more concerned with safety if she is left alone.  Previously working with SLP but discharged on 3/8 per patient request as she believes she was at baseline - per SLP note, remaining deficits mild cognitive deficits related to executive functioning, attention and recall with decreased awareness Continues working with PT/OT for gait unsteadiness, decreased attention, impulsivity and decreased scanning on right side Denies new or worsening stroke/TIA symptoms  Completed 3 weeks DAPT but apparently has continued on both aspirin and Plavix - daughter questions this as she does not believe she has remained on aspirin but patient reports she has -denies side effects Compliant on simvastatin -denies side effects  blood pressure today 106/62 -monitors at home and typically ranges 100-140/70-80s  Loop recorder has not shown atrial fibrillation thus far  No further concerns at this time   Stroke admission 01/06/2021 Ms. NOEMI BELLISSIMO is a 81 y.o. female with history of  HLD, HTN, PVD, breast cancer s/p lumpectomy and XRT 2019 and hypothyroidism who presented  to [facility] ED on 01/06/2021 with slurred speech, difficulty finding her words, and right facial droop.  Personally reviewed hospitalization pertinent progress notes, lab work and imaging with summary provided.  Evaluated by Dr.  Erlinda Hong with stroke work-up revealing left MCA infarcts, embolic secondary to unknown source.  Placement of loop recorder to assess for A. fib as potential etiology.  DAPT for 3 weeks and Plavix alone as on aspirin PTA.  LDL 68.  A1c 6.0.  Other stroke risk factors include advanced age and PVD but no prior stroke history.  Evaluated by therapies and recommended inpatient rehab for ongoing therapy needs  Stroke:  left MCA infarcts embolic secondary to unknown source, concerning for embolic.  Code Stroke CT head No acute abnormality. ASPECTS 10.    CTA head & neck no LVO MRI  L frontal opercular and L parietal infarct; distal L MCA M3 occlusion   Repeat CT head  (01/07/21) showed acute infarct of left frontal lobe with developing hypodensity but no hemorrhage. LE Doppler  No DVT 2D Echo- LVEF 60 to 65%, mild to moderate AVR and mild MVR S/p ILR 2/10 LDL 68 HgbA1c 6.0 VTE prophylaxis - Lovenox 40 mg sq daily  aspirin 81 mg daily prior to admission, now on aspirin 81 mg daily and clopidogrel 75 mg daily. Continue DAPT x 21 days then plavix alone Therapy recommendations:  CIR --> HPIR Disposition:  HPIR      ROS:   14 system review of systems performed and negative with exception of those listed in HPI  PMH:  Past Medical History:  Diagnosis Date   Arthritis    Breast CA ([place])    right breast cancer   Cancer ([place])    HAD RIGHT BREAST BX --GOING TO [facility]ER C[facility] ON WED 12/30/2017   Constipation 08/13/2011   Dizziness 08/13/2011   Family history of adverse reaction to anesthesia    pt mother had PONV   Family history of breast cancer    Genetic testing 01/28/2018   Multi-Cancer panel (83 genes) @ [facility] - No pathogenic mutations detected   GERD (gastroesophageal reflux disease)    Heart murmur    "SLIGHT HEART MURMUR"  WAS PICKED UP AS AN ADULT   Hyperlipidemia    Hypertension    Hypothyroidism    Osteoarthritis of knee    Right   Peripheral vascular disease ([place])    Reflux     Splenic artery aneurysm ([place])    stable 1.5cm by 08/2013 CT; no further surveillance recommended   Stroke ([place]) 01/2021   Weight loss     PSH:  Past Surgical History:  Procedure Laterality Date   ABDOMINAL HYSTERECTOMY     BACK SURGERY     BREAST LUMPECTOMY WITH RADIOACTIVE SEED LOCALIZATION Right 02/19/2018   Procedure: BREAST LUMPECTOMY WITH RADIOACTIVE SEED LOCALIZATION. RIGHT;  Surgeon: Alphonsa Overall, MD;  Location: [facility];  Service: General;  Laterality: Right;   BREAST SURGERY     CHOLECYSTECTOMY     KNEE SURGERY     LOOP RECORDER INSERTION N/A 01/10/2021   Procedure: LOOP RECORDER INSERTION;  Surgeon: Constance Haw, MD;  Location: [facility];  Service: Cardiovascular;  Laterality: N/A;   LUMBAR LAMINECTOMY     TOTAL KNEE ARTHROPLASTY Right 01/04/2018   TOTAL KNEE ARTHROPLASTY Right 01/04/2018   Procedure: TOTAL KNEE ARTHROPLASTY;  Surgeon: Vickey Huger, MD;  Location: [facility];  Service: Orthopedics;  Laterality: Right;   VAGINAL PROLAPSE REPAIR     2015  DONE IN [place]   VAGINAL PROLAPSE REPAIR  05/20/2018    Social History:  Social History   Socioeconomic History   Marital status: Legally Separated    Spouse name: Not on file   Number of children: Not on file   Years of education: Not on file   Highest education level: Not on file  Occupational History   Not on file  Tobacco Use   Smoking status: Never   Smokeless tobacco: Never  Vaping Use   Vaping Use: Never used  Substance and Sexual Activity   Alcohol use: No   Drug use: No   Sexual activity: Not on file  Other Topics Concern   Not on file  Social History Narrative   Lives with 2 other ladies   Social Determinants of Health   Financial Resource Strain: Not on file  Food Insecurity: Not on file  Transportation Needs: Not on file  Physical Activity: Not on file  Stress: Not on file  Social Connections: Not on file  Intimate Partner Violence: Not on file    Family History:  Family  History  Problem Relation Age of Onset   Heart disease Mother    Breast cancer Mother 35       deceased 34; TAH/BSO (age?)   Heart disease Father    Breast cancer Daughter        dx 45s; currenlty 83; Neg BRCA1/BRCA2   Breast cancer Maternal Aunt 44       deceased 48    Medications:   Current Outpatient Medications on File Prior to Visit  Medication Sig Dispense Refill   atorvastatin (LIPITOR) 20 MG tablet Take 20 mg by mouth daily.     carvedilol (COREG) 12.5 MG tablet Take 12.5 mg by mouth 2 (two) times daily with a meal.     clopidogrel (PLAVIX) 75 MG tablet Take 75 mg by mouth daily.     fluticasone (FLONASE) 50 MCG/ACT nasal spray Place 1 spray into both nostrils daily as needed for allergies or rhinitis.     gabapentin (NEURONTIN) 300 MG capsule Take 1 capsule (300 mg total) by mouth at bedtime. 90 capsule 4   levothyroxine (SYNTHROID, LEVOTHROID) 75 MCG tablet Take 75 mcg by mouth daily before breakfast.     Prenatal Vit-Fe Fumarate-FA (PRENATAL MULTIVITAMIN) TABS tablet Take 1 tablet by mouth daily at 12 noon.     No current facility-administered medications on file prior to visit.    Allergies:   Allergies  Allergen Reactions   Penicillins Nausea And Vomiting, Rash and Other (See Comments)    PATIENT HAS HAD A PCN REACTION WITH IMMEDIATE RASH, FACIAL/TONGUE/THROAT SWELLING, SOB, OR LIGHTHEADEDNESS WITH HYPOTENSION:  #  #  #  YES  #  #  #   Has patient had a PCN reaction causing severe rash involving mucus membranes or skin necrosis: No Has patient had a PCN reaction that required hospitalization: No Has patient had a PCN reaction occurring within the last 10 years: No       OBJECTIVE:  Physical Exam  Vitals:   12/12/21 1535  BP: (!) 155/75  Pulse: 75  Weight: 168 lb (76.2 kg)  Height: $Remove'5\' 4"'lLUMuhp$  (1.626 m)   Body mass index is 28.84 kg/m. No results found.  General: well developed, well nourished, pleasant elderly Caucasian female, seated, in no evident  distress Head: head normocephalic and atraumatic Neck: supple with no carotid or supraclavicular bruits Cardiovascular: regular rate and rhythm, no murmurs Musculoskeletal: no deformity  Skin:  no rash/petichiae Vascular:  Normal pulses all extremities   Neurologic Exam Mental Status: Awake and fully alert. Fluent speech and language. Oriented to place and time. Recent memory mildly impaired and remote memory intact. Attention span, concentration and fund of knowledge intact during visit. Cranial Nerves: Pupils equal, briskly reactive to light. Extraocular movements full without nystagmus. Visual fields full to confrontation. Hearing intact. Facial sensation intact. Face, tongue, palate moves normally and symmetrically.  Motor: Normal bulk and tone. Normal strength in all tested extremity muscles Sensory.: intact to touch , pinprick , position and vibratory sensation.  Coordination: Rapid alternating movements normal in all extremities. Finger-to-nose and heel-to-shin performed accurately bilaterally. Gait and Station: Arises from chair without difficulty. Stance is normal. Gait demonstrates normal stride length and balance without use of assistive device.   Reflexes: 1+ and symmetric. Toes downgoing.         ASSESSMENT: NICLOE FRONTERA is a 81 y.o. year old female presented with slurred speech, word finding difficulty and right facial droop on 01/06/2021 with stroke work-up revealing left MCA infarcts (L frontal opercular and L parietal) with distal left MCA M3 occlusion, embolic secondary to unknown source s/p ILR. Vascular risk factors include HTN, HLD, PVD and advanced age.      PLAN:  L MCA stroke, cryptogenic:  Residual deficit: Recovered well with only mild residual cognitive impairment  Continue routine exercise at home, healthy diet and adequate sleep Loop recorder has not shown atrial fibrillation thus far - will continue to be monitored by cardiology Continue clopidogrel 75  mg daily  and simvastatin 20 mg daily for secondary stroke prevention.   Discussed secondary stroke prevention measures and importance of close PCP follow up for aggressive stroke risk factor management  HTN: BP goal <130/90.  Stable on carvedilol per PCP HLD: LDL goal <70.  Prior LDL 68 (01/2021) on simvastatin 20 mg daily per PCP   Follow up on an as needed basis   CC:  [facility] provider: Dr. Leonie Man PCP: Gwyndolyn Kaufman, PA-C    I spent 32 minutes of face-to-face and non-face-to-face time with patient and daughter.  This included previsit chart review, lab review, study review, electronic health record documentation, patient and daughter education and discussion regarding prior stroke and secondary stroke prevention and importance of aggressive stroke risk factor management, review of loop recorder, residual deficits, and answered all other questions to patient and daughters satisfaction   Frann Rider, [facility]  [facility] [address] [place], [place] [address]  Phone [phone] Fax [phone] Note: This document was prepared with digital dictation and possible smart phrase technology. Any transcriptional errors that result from this process are unintentional.

## 2021-12-16 ENCOUNTER — Ambulatory Visit (INDEPENDENT_AMBULATORY_CARE_PROVIDER_SITE_OTHER): Payer: Federal, State, Local not specified - PPO

## 2021-12-16 ENCOUNTER — Telehealth: Payer: Self-pay | Admitting: *Deleted

## 2021-12-16 ENCOUNTER — Telehealth: Payer: Self-pay

## 2021-12-16 DIAGNOSIS — I639 Cerebral infarction, unspecified: Secondary | ICD-10-CM | POA: Diagnosis not present

## 2021-12-16 LAB — CUP PACEART REMOTE DEVICE CHECK
Date Time Interrogation Session: 20230115230434
Implantable Pulse Generator Implant Date: 20220210

## 2021-12-16 NOTE — Telephone Encounter (Signed)
Will send message to Dr. Curt Bears scheduler [place] to reach out to the pt with appt. I did try to reach the pt though, Left message for the pt to call back. Pt can ask to s/w [place], [employer] or with Arbie Cookey.

## 2021-12-16 NOTE — Telephone Encounter (Signed)
° °  Pre-operative Risk Assessment    Patient Name: Crystal Trevino  DOB: 1941/07/10 MRN: [id number]      Request for Surgical Clearance    Procedure:   LEFT TOTAL KNEE ARTHROPLASTY  Date of Surgery:  Clearance TBD                                 Surgeon:  DR. Lara Mulch Surgeon's Group or Practice Name:  [facility] Phone number:  [phone] Fax number:  [phone]   Type of Clearance Requested:   - Medical  - Pharmacy:  Hold Clopidogrel (Plavix)     Type of Anesthesia:   CHOICE   Additional requests/questions:    Jiles Prows   12/16/2021, 2:29 PM

## 2021-12-16 NOTE — Telephone Encounter (Signed)
Primary [place], MD  Chart reviewed as part of pre-operative protocol coverage. Because of Crystal Trevino past medical history and time since last visit, he/she will require a follow-up visit in order to better assess preoperative cardiovascular risk.  Pre-op covering staff: - Please schedule appointment and call patient to inform them. - Please contact requesting surgeon's office via preferred method (i.e, phone, fax) to inform them of need for appointment prior to surgery.  If applicable, this message will also be routed to pharmacy pool and/or primary cardiologist for input on holding anticoagulant/antiplatelet agent as requested below so that this information is available at time of patient's appointment.   Deberah Pelton, NP  12/16/2021, 2:48 PM

## 2021-12-16 NOTE — Telephone Encounter (Signed)
[facility] pre-op clearance has been signed/completed and faxed back to # [phone]. Confirmation received

## 2021-12-17 NOTE — Telephone Encounter (Signed)
Pt called back today. I s/w the pt in regard to needing an appt for pre op clearance. Pt states she needs a Friday appt afternoon time. I did offer her 01/03/22 at 11 am as I did not have any afternoon appts until late Feb 2023. Pt accepted 01/03/22 11 am with Oda Kilts, PAC. I will forward notes to [facility] for upcoming appt. Will send FYI to requesting office pt has appt 01/03/22.

## 2021-12-17 NOTE — Telephone Encounter (Signed)
Patient returned call. Call transferred to [facility], [place].

## 2021-12-26 NOTE — Progress Notes (Signed)
[facility] Summary Report / Loop Recorder 

## 2022-01-02 NOTE — Progress Notes (Signed)
Electrophysiology Office Note Date: 01/02/2022  ID:  Crystal Trevino, DOB 1941/03/08, MRN [id number]  PCP: Gwyndolyn Kaufman, PA-C Primary Cardiologist: None Electrophysiologist: Will Meredith Leeds, MD   CC: ILR follow-up  Crystal Trevino is a 81 y.o. female seen today for Dr. Curt Bears . she presents today for cardiac clearance for left total knee arthroplasty.   She has no cardiac issues. Her loop recorder was placed for long term cardiac monitoring for patient with cryptogenic stroke.    Since last being seen in our clinic, the patient reports doing very well.  she denies chest pain, palpitations, dyspnea, PND, orthopnea, nausea, vomiting, dizziness, syncope, edema, weight gain, or early satiety.  Device History: Medtronic loop recorder implanted 01/2021 for Cryptogenic Stroke  Past Medical History:  Diagnosis Date   Arthritis    Breast CA ([place])    right breast cancer   Cancer ([place])    HAD RIGHT BREAST BX --GOING TO CANCER CENTER ON WED 12/30/2017   Constipation 08/13/2011   Dizziness 08/13/2011   Family history of adverse reaction to anesthesia    pt mother had PONV   Family history of breast cancer    Genetic testing 01/28/2018   Multi-Cancer panel (83 genes) @ [facility] - No pathogenic mutations detected   GERD (gastroesophageal reflux disease)    Heart murmur    "SLIGHT HEART MURMUR"  WAS PICKED UP AS AN ADULT   Hyperlipidemia    Hypertension    Hypothyroidism    Osteoarthritis of knee    Right   Peripheral vascular disease ([place])    Reflux    Splenic artery aneurysm ([place])    stable 1.5cm by 08/2013 CT; no further surveillance recommended   Stroke ([place]) 01/2021   Weight loss    Past Surgical History:  Procedure Laterality Date   ABDOMINAL HYSTERECTOMY     BACK SURGERY     BREAST LUMPECTOMY WITH RADIOACTIVE SEED LOCALIZATION Right 02/19/2018   Procedure: BREAST LUMPECTOMY WITH RADIOACTIVE SEED LOCALIZATION. RIGHT;  Surgeon: Alphonsa Overall, MD;  Location: [facility];   Service: General;  Laterality: Right;   BREAST SURGERY     CHOLECYSTECTOMY     KNEE SURGERY     LOOP RECORDER INSERTION N/A 01/10/2021   Procedure: LOOP RECORDER INSERTION;  Surgeon: Constance Haw, MD;  Location: [facility];  Service: Cardiovascular;  Laterality: N/A;   LUMBAR LAMINECTOMY     TOTAL KNEE ARTHROPLASTY Right 01/04/2018   TOTAL KNEE ARTHROPLASTY Right 01/04/2018   Procedure: TOTAL KNEE ARTHROPLASTY;  Surgeon: Vickey Huger, MD;  Location: [facility];  Service: Orthopedics;  Laterality: Right;   VAGINAL PROLAPSE REPAIR     2015   DONE IN [place]   VAGINAL PROLAPSE REPAIR  05/20/2018    Current Outpatient Medications  Medication Sig Dispense Refill   atorvastatin (LIPITOR) 20 MG tablet Take 20 mg by mouth daily.     carvedilol (COREG) 12.5 MG tablet Take 12.5 mg by mouth 2 (two) times daily with a meal.     clopidogrel (PLAVIX) 75 MG tablet Take 75 mg by mouth daily.     fluticasone (FLONASE) 50 MCG/ACT nasal spray Place 1 spray into both nostrils daily as needed for allergies or rhinitis.     gabapentin (NEURONTIN) 300 MG capsule Take 1 capsule (300 mg total) by mouth at bedtime. 90 capsule 4   levothyroxine (SYNTHROID, LEVOTHROID) 75 MCG tablet Take 75 mcg by mouth daily before breakfast.     Prenatal Vit-Fe Fumarate-FA (  PRENATAL MULTIVITAMIN) TABS tablet Take 1 tablet by mouth daily at 12 noon.     No current facility-administered medications for this visit.    Allergies:   Penicillins   Social History: Social History   Socioeconomic History   Marital status: Legally Separated    Spouse name: Not on file   Number of children: Not on file   Years of education: Not on file   Highest education level: Not on file  Occupational History   Not on file  Tobacco Use   Smoking status: Never   Smokeless tobacco: Never  Vaping Use   Vaping Use: Never used  Substance and Sexual Activity   Alcohol use: No   Drug use: No   Sexual activity: Not on file  Other  Topics Concern   Not on file  Social History Narrative   Lives with 2 other ladies   Social Determinants of Health   Financial Resource Strain: Not on file  Food Insecurity: Not on file  Transportation Needs: Not on file  Physical Activity: Not on file  Stress: Not on file  Social Connections: Not on file  Intimate Partner Violence: Not on file    Family History: Family History  Problem Relation Age of Onset   Heart disease Mother    Breast cancer Mother 66       deceased 77; TAH/BSO (age?)   Heart disease Father    Breast cancer Daughter        dx 22s; currenlty 38; Neg BRCA1/BRCA2   Breast cancer Maternal Aunt 44       deceased 57     Review of Systems: All other systems reviewed and are otherwise negative except as noted above.  Physical Exam: There were no vitals filed for this visit.   GEN- The patient is well appearing, alert and oriented x 3 today.   HEENT: normocephalic, atraumatic; sclera clear, conjunctiva pink; hearing intact; oropharynx clear; neck supple  Lungs- Clear to ausculation bilaterally, normal work of breathing.  No wheezes, rales, rhonchi Heart- Regular rate and rhythm, no murmurs, rubs or gallops  GI- soft, non-tender, non-distended, bowel sounds present  Extremities- no clubbing, cyanosis, or edema  MS- no significant deformity or atrophy Skin- warm and dry, no rash or lesion; PPM pocket well healed Psych- euthymic mood, full affect Neuro- strength and sensation are intact  PPM Interrogation- reviewed in detail today,  See PACEART report  EKG:  EKG is ordered today. The ekg ordered today shows NSR at 68 bpm  Recent Labs: 01/07/2021: TSH 3.265 05/19/2021: ALT 17; BUN 27; Creatinine, Ser 0.91; Hemoglobin 12.2; Platelets 216; Potassium 4.0; Sodium 140   Wt Readings from Last 3 Encounters:  12/12/21 168 lb (76.2 kg)  05/30/21 167 lb (75.8 kg)  05/19/21 160 lb (72.6 kg)     Other studies Reviewed: Additional studies/ records that were  reviewed today include: Echo 01/2021 shows LVEF 60-65%, Previous EP office notes, Previous remote checks, Most recent labwork.   Assessment and Plan:  1. Cryptogenic Stroke s/p Medtronic Loop recorder Normal device function See Pace Art report No changes today  2. Cardiac Clearance for left total knee Pt has no active cardiac problems She may proceed without further work up and is considered low risk (0.9%) of perioperative complications from a cardiac perspective by revised risk cardiac index (lee criteria) She is on plavix for history of stroke; Our office is not managing.   3. HTN Elevated today in setting of white coat HTN  and steroids. She states her BP is well managed at home otherwise.  If BP remains > 140 with any regularity after finishing steroids, should have PCP increase coreg or call us for instructions.   Current medicines are reviewed at length with the patient today.    Disposition:   Follow up with Dr. Curt Bears in office as needed. We will continue long term remote monitoring.    Jacalyn Lefevre, PA-C  01/02/2022 2:48 PM  [place] [facility] [place] [address] [phone] (office) [phone] (fax)

## 2022-01-03 ENCOUNTER — Encounter: Payer: Self-pay | Admitting: Student

## 2022-01-03 ENCOUNTER — Ambulatory Visit: Payer: Federal, State, Local not specified - PPO | Admitting: Student

## 2022-01-03 ENCOUNTER — Other Ambulatory Visit: Payer: Self-pay

## 2022-01-03 VITALS — BP 170/84 | HR 68 | Ht 64.0 in | Wt 159.1 lb

## 2022-01-03 DIAGNOSIS — I639 Cerebral infarction, unspecified: Secondary | ICD-10-CM | POA: Diagnosis not present

## 2022-01-03 DIAGNOSIS — Z4509 Encounter for adjustment and management of other cardiac device: Secondary | ICD-10-CM | POA: Diagnosis not present

## 2022-01-10 ENCOUNTER — Other Ambulatory Visit: Payer: Self-pay

## 2022-01-10 ENCOUNTER — Emergency Department (HOSPITAL_COMMUNITY): Payer: Federal, State, Local not specified - PPO

## 2022-01-10 ENCOUNTER — Encounter (HOSPITAL_COMMUNITY): Payer: Self-pay | Admitting: Emergency Medicine

## 2022-01-10 ENCOUNTER — Emergency Department (HOSPITAL_COMMUNITY)
Admission: EM | Admit: 2022-01-10 | Discharge: 2022-01-10 | Disposition: A | Discharge status: Home / Self Care | Payer: Federal, State, Local not specified - PPO | Attending: Emergency Medicine | Admitting: Emergency Medicine

## 2022-01-10 DIAGNOSIS — Z79899 Other long term (current) drug therapy: Secondary | ICD-10-CM | POA: Insufficient documentation

## 2022-01-10 DIAGNOSIS — I10 Essential (primary) hypertension: Secondary | ICD-10-CM | POA: Diagnosis not present

## 2022-01-10 DIAGNOSIS — R0602 Shortness of breath: Secondary | ICD-10-CM | POA: Diagnosis not present

## 2022-01-10 DIAGNOSIS — Z853 Personal history of malignant neoplasm of breast: Secondary | ICD-10-CM | POA: Diagnosis not present

## 2022-01-10 DIAGNOSIS — R059 Cough, unspecified: Secondary | ICD-10-CM | POA: Diagnosis not present

## 2022-01-10 DIAGNOSIS — J3489 Other specified disorders of nose and nasal sinuses: Secondary | ICD-10-CM | POA: Insufficient documentation

## 2022-01-10 DIAGNOSIS — Z20822 Contact with and (suspected) exposure to covid-19: Secondary | ICD-10-CM | POA: Insufficient documentation

## 2022-01-10 DIAGNOSIS — R0789 Other chest pain: Secondary | ICD-10-CM | POA: Diagnosis present

## 2022-01-10 DIAGNOSIS — Z7902 Long term (current) use of antithrombotics/antiplatelets: Secondary | ICD-10-CM | POA: Diagnosis not present

## 2022-01-10 LAB — CBC WITH DIFFERENTIAL/PLATELET
Abs Immature Granulocytes: 0.02 10*3/uL (ref 0.00–0.07)
Basophils Absolute: 0 10*3/uL (ref 0.0–0.1)
Basophils Relative: 0 %
Eosinophils Absolute: 0.1 10*3/uL (ref 0.0–0.5)
Eosinophils Relative: 2 %
HCT: 36 % (ref 36.0–46.0)
Hemoglobin: 12 g/dL (ref 12.0–15.0)
Immature Granulocytes: 0 %
Lymphocytes Relative: 16 %
Lymphs Abs: 1 10*3/uL (ref 0.7–4.0)
MCH: 31.1 pg (ref 26.0–34.0)
MCHC: 33.3 g/dL (ref 30.0–36.0)
MCV: 93.3 fL (ref 80.0–100.0)
Monocytes Absolute: 0.5 10*3/uL (ref 0.1–1.0)
Monocytes Relative: 8 %
Neutro Abs: 4.4 10*3/uL (ref 1.7–7.7)
Neutrophils Relative %: 74 %
Platelets: 247 10*3/uL (ref 150–400)
RBC: 3.86 MIL/uL — ABNORMAL LOW (ref 3.87–5.11)
RDW: 13.7 % (ref 11.5–15.5)
WBC: 6 10*3/uL (ref 4.0–10.5)
nRBC: 0 % (ref 0.0–0.2)

## 2022-01-10 LAB — COMPREHENSIVE METABOLIC PANEL
ALT: 20 U/L (ref 0–44)
AST: 30 U/L (ref 15–41)
Albumin: 3.5 g/dL (ref 3.5–5.0)
Alkaline Phosphatase: 72 U/L (ref 38–126)
Anion gap: 8 (ref 5–15)
BUN: 12 mg/dL (ref 8–23)
CO2: 25 mmol/L (ref 22–32)
Calcium: 9.2 mg/dL (ref 8.9–10.3)
Chloride: 104 mmol/L (ref 98–111)
Creatinine, Ser: 0.87 mg/dL (ref 0.44–1.00)
GFR, Estimated: >60 mL/min (ref >60)
Glucose, Bld: 118 mg/dL — ABNORMAL HIGH (ref 70–99)
Potassium: 4.4 mmol/L (ref 3.5–5.1)
Sodium: 137 mmol/L (ref 135–145)
Total Bilirubin: 2.3 mg/dL — ABNORMAL HIGH (ref 0.3–1.2)
Total Protein: 6.1 g/dL — ABNORMAL LOW (ref 6.5–8.1)

## 2022-01-10 LAB — LIPASE, BLOOD: Lipase: 49 U/L (ref 11–51)

## 2022-01-10 LAB — RESP PANEL BY RT-PCR (FLU A&B, COVID) ARPGX2
Influenza A by PCR: NEGATIVE
Influenza B by PCR: NEGATIVE
SARS Coronavirus 2 by RT PCR: NEGATIVE

## 2022-01-10 LAB — TROPONIN I (HIGH SENSITIVITY)
Troponin I (High Sensitivity): 4 ng/L (ref <18)
Troponin I (High Sensitivity): 3 ng/L (ref <18)

## 2022-01-10 MED ORDER — FENTANYL CITRATE PF 50 MCG/ML IJ SOSY
25.0000 ug | PREFILLED_SYRINGE | Freq: Once | INTRAMUSCULAR | Status: AC
  Administered 2022-01-10: 25 ug via INTRAVENOUS
  Filled 2022-01-10: qty 1

## 2022-01-10 NOTE — ED Provider Notes (Signed)
[facility] EMERGENCY DEPARTMENT Provider Note   CSN: [id number] Arrival date & time: 01/10/22  1328     History  Chief Complaint  Patient presents with   Chest Pain    Crystal Trevino is a 81 y.o. female with a past medical history of hypertension, hyperlipidemia, prior stroke presenting to the ED with chief complaint of chest pain.  Began having chest pain a few hours ago.  Reports sharp, intermittent left lower chest pain without specific aggravating or alleviating factor.  She has been having common cold symptoms for the past 4 days including cough productive with green mucus, shortness of breath, rhinorrhea.  She took a dose of cough medicine this morning.  She denies any leg swelling, hemoptysis, known personal history of CAD, abdominal pain, vomiting or diarrhea.   Chest Pain Associated symptoms: cough and shortness of breath   Associated symptoms: no abdominal pain, no dizziness, no fever, no nausea, no palpitations, no vomiting and no weakness       Home Medications Prior to Admission medications   Medication Sig Start Date End Date Taking? Authorizing Provider  atorvastatin (LIPITOR) 20 MG tablet Take 20 mg by mouth daily. 09/26/21   [provider]  benzonatate (TESSALON) 200 MG capsule Take 200 mg by mouth 3 (three) times daily as needed. 12/30/21   [provider]  carvedilol (COREG) 12.5 MG tablet Take 12.5 mg by mouth 2 (two) times daily with a meal.    [provider]  clopidogrel (PLAVIX) 75 MG tablet Take 75 mg by mouth daily.    [provider]  doxycycline (VIBRAMYCIN) 100 MG capsule Take 100 mg by mouth every 12 (twelve) hours. 12/30/21   [provider]  fluticasone (FLONASE) 50 MCG/ACT nasal spray Place 1 spray into both nostrils daily as needed for allergies or rhinitis.    [provider]  levothyroxine (SYNTHROID, LEVOTHROID) 75 MCG tablet Take 75 mcg by mouth daily before breakfast.     [provider]  PREDNISONE, PAK, PO Take 10 mg by mouth as directed. Take taper dose as directed    [provider]  Prenatal Vit-Fe Fumarate-FA (PRENATAL MULTIVITAMIN) TABS tablet Take 1 tablet by mouth daily at 12 noon.    [provider]      Allergies    Penicillins    Review of Systems   Review of Systems  Constitutional:  Negative for appetite change, chills and fever.  HENT:  Positive for congestion. Negative for ear pain, rhinorrhea, sneezing and sore throat.   Eyes:  Negative for photophobia and visual disturbance.  Respiratory:  Positive for cough and shortness of breath. Negative for chest tightness and wheezing.   Cardiovascular:  Positive for chest pain. Negative for palpitations.  Gastrointestinal:  Negative for abdominal pain, blood in stool, constipation, diarrhea, nausea and vomiting.  Genitourinary:  Negative for dysuria, hematuria and urgency.  Musculoskeletal:  Negative for myalgias.  Skin:  Negative for rash.  Neurological:  Negative for dizziness, weakness and light-headedness.   Physical Exam Updated Vital Signs BP 123/60    Pulse 67    Temp 98 F (36.7 C)    Resp 14    Ht 5\' 4"  (1.626 m)    Wt 72.1 kg    SpO2 96%    BMI 27.29 kg/m  Physical Exam Vitals and nursing note reviewed.  Constitutional:      General: She is not in acute distress.    Appearance: She is well-developed.  HENT:     Head: Normocephalic and atraumatic.     Nose: Nose normal.  Eyes:     General: No scleral icterus.       Left eye: No discharge.     Conjunctiva/sclera: Conjunctivae normal.  Cardiovascular:     Rate and Rhythm: Normal rate and regular rhythm.     Heart sounds: Normal heart sounds. No murmur heard.   No friction rub. No gallop.  Pulmonary:     Effort: Pulmonary effort is normal. No respiratory distress.     Breath sounds: Normal breath sounds.  Chest:     Comments: Chest wall is nontender. Abdominal:     General: Bowel sounds are  normal. There is no distension.     Palpations: Abdomen is soft.     Tenderness: There is no abdominal tenderness. There is no guarding.     Comments: Abdomen is soft and nontender.  Musculoskeletal:        General: Normal range of motion.     Cervical back: Normal range of motion and neck supple.  Skin:    General: Skin is warm and dry.     Findings: No rash.  Neurological:     Mental Status: She is alert.     Motor: No abnormal muscle tone.     Coordination: Coordination normal.    ED Results / Procedures / Treatments   Labs (all labs ordered are listed, but only abnormal results are displayed) Labs Reviewed  COMPREHENSIVE METABOLIC PANEL - Abnormal; Notable for the following components:      Result Value   Glucose, Bld 118 (*)    Total Protein 6.1 (*)    Total Bilirubin 2.3 (*)    All other components within normal limits  CBC WITH DIFFERENTIAL/PLATELET - Abnormal; Notable for the following components:   RBC 3.86 (*)    All other components within normal limits  RESP PANEL BY RT-PCR (FLU A&B, COVID) ARPGX2  LIPASE, BLOOD  TROPONIN I (HIGH SENSITIVITY)  TROPONIN I (HIGH SENSITIVITY)    EKG EKG Interpretation  Date/Time:  Friday January 10 2022 13:53:54 EST Ventricular Rate:  73 PR Interval:  166 QRS Duration: 99 QT Interval:  399 QTC Calculation: 440 R Axis:   50 Text Interpretation: Sinus rhythm no acute ST/T changes similar to Feb 2022 Confirmed by Sherwood Gambler [phone]) on 01/10/2022 2:43:27 PM  Radiology DG Chest 2 View  Result Date: 01/10/2022 CLINICAL DATA:  Chest pain, shortness of breath EXAM: CHEST - 2 VIEW COMPARISON:  12/25/2011 FINDINGS: Cardiac size is within normal limits. Lung fields are clear of any infiltrates or pulmonary edema. There is no pleural effusion or pneumothorax. Implantable cardiac monitoring device is noted in the left chest wall. IMPRESSION: No active cardiopulmonary disease. Electronically Signed   By: Elmer Picker M.D.    On: 01/10/2022 14:33    Procedures Procedures    Medications Ordered in ED Medications  fentaNYL (SUBLIMAZE) injection 25 mcg (25 mcg Intravenous Given 01/10/22 1453)    ED Course/ Medical Decision Making/ A&P Clinical Course as of 01/10/22 1858  Fri Jan 10, 2022  1603 Lipase: 49 [HK]  1603 Troponin I (High Sensitivity): 4 [HK]  1617 SARS Coronavirus 2 by RT PCR: NEGATIVE [HK]  5732 Patient reports improvement with fentanyl.  She is chest pain-free at this time. [HK]  2025 Patient states that she has been in remission for her breast cancer for 3 years and is not currently undergoing treatment. [HK]  1819 Troponin I (  High Sensitivity): 3 [HK]  1828 SpO2: 100 % [HK]  1828 Pulse Rate: 69 [HK]    Clinical Course User Index [HK] Delia Heady, PA-C                           Medical Decision Making Amount and/or Complexity of Data Reviewed External Data Reviewed: labs, radiology and ECG. Labs: ordered. Decision-making details documented in ED Course. Radiology: ordered.  Risk Prescription drug management.   81 year old female presenting to the ED with intermittent sharp left lower chest pain that began today.  She has had common cold symptoms including cough productive with green mucus, shortness of breath and congestion for the past few days.  She has not experienced chest pain like this in the past.  No cardiac history that she is aware of.  She currently is compliant with her Plavix.  She denies any abdominal pain, vomiting or hemoptysis.  On exam chest wall is nontender.  Lungs are clear bilaterally.  No signs of respiratory distress.  We will obtain lab work, EKG and reassess.  EKG shows sinus rhythm, no ST or T wave changes and this is similar to prior EKG last year.  Chest x-ray is unremarkable.  CMP, CBC, lipase unremarkable.  Respiratory panel is negative.  Initial and delta troponin are both negative.  Patient with complete resolution of symptoms with fentanyl and is chest  pain-free for the remainder of ED course.  She has no recent immobilization, recent cancer treatment that would predispose her to a PE.  She has no hypoxia or tachycardia that would concerning for PE.  No structural cause seen on imaging either.  I doubt her symptoms are ACS due to improvement here as well as reassuring work-up.  Suspect her common cold symptoms could be due to viral cause.  We will have her follow-up with PCP and continue home medications.  Patient is agreeable to the plan.  Return precautions given.  All imaging, if done today, including plain films, CT scans, and ultrasounds, independently reviewed by me, and interpretations confirmed via formal radiology reads.  Patient is hemodynamically stable, in NAD, and able to ambulate in the ED. Evaluation does not show pathology that would require ongoing emergent intervention or inpatient treatment. I explained the diagnosis to the patient. Pain has been managed and has no complaints prior to discharge. Patient is comfortable with above plan and is stable for discharge at this time. All questions were answered prior to disposition. Strict return precautions for returning to the ED were discussed. Encouraged follow up with PCP.   An After Visit Summary was printed and given to the patient.   Portions of this note were generated with [employer]. Dictation errors may occur despite best attempts at proofreading.        Final Clinical Impression(s) / ED Diagnoses Final diagnoses:  Chest wall pain    Rx / DC Orders ED Discharge Orders     None         Delia Heady, PA-C 01/10/22 Jule Economy, MD 01/11/22 [phone]

## 2022-01-10 NOTE — Telephone Encounter (Signed)
Pt was cleared when she saw Oda Kilts, [facility] 01/03/22. Ov notes were faxed over to requesting at that time. I will however re-fax them today.

## 2022-01-10 NOTE — Discharge Instructions (Addendum)
Continue your home medications as previously prescribed. Follow-up with your primary care provider. Return to the ER if you start to experience worsening symptoms, severe chest pain, shortness of breath, lightheadedness, vomiting or coughing up blood.

## 2022-01-10 NOTE — ED Notes (Signed)
Pt verbalized understanding of d/c instructions, meds, and followup care. Denies questions. VSS, no distress noted. Steady gait to exit with all belongings.  ?

## 2022-01-10 NOTE — ED Triage Notes (Signed)
Per [facility] pt coming from home. Cold symptoms for 4-5 days and was seen by PCP. C/o intermittent sharp epigastric pain onset of this afternoon. 324 aspirin and 1 nitro given en route. Reports green phlegm with cough.

## 2022-01-10 NOTE — Telephone Encounter (Signed)
Patient calling in to say that he dr office hasnt received her clearance. Please advise

## 2022-01-20 ENCOUNTER — Ambulatory Visit (INDEPENDENT_AMBULATORY_CARE_PROVIDER_SITE_OTHER): Payer: Federal, State, Local not specified - PPO

## 2022-01-20 DIAGNOSIS — I639 Cerebral infarction, unspecified: Secondary | ICD-10-CM | POA: Diagnosis not present

## 2022-01-20 LAB — CUP PACEART REMOTE DEVICE CHECK
Date Time Interrogation Session: 20230219231043
Implantable Pulse Generator Implant Date: 20220210

## 2022-01-24 NOTE — Progress Notes (Signed)
[facility] Summary Report / Loop Recorder 

## 2022-02-24 ENCOUNTER — Ambulatory Visit (INDEPENDENT_AMBULATORY_CARE_PROVIDER_SITE_OTHER): Payer: Federal, State, Local not specified - PPO

## 2022-02-24 DIAGNOSIS — I639 Cerebral infarction, unspecified: Secondary | ICD-10-CM | POA: Diagnosis not present

## 2022-02-25 LAB — CUP PACEART REMOTE DEVICE CHECK
Date Time Interrogation Session: 20230326230454
Implantable Pulse Generator Implant Date: 20220210

## 2022-03-06 NOTE — Progress Notes (Signed)
[facility] Summary Report / Loop Recorder 

## 2022-03-30 LAB — CUP PACEART REMOTE DEVICE CHECK
Date Time Interrogation Session: 20230428230501
Implantable Pulse Generator Implant Date: 20220210

## 2022-03-31 ENCOUNTER — Ambulatory Visit (INDEPENDENT_AMBULATORY_CARE_PROVIDER_SITE_OTHER): Payer: Federal, State, Local not specified - PPO

## 2022-03-31 DIAGNOSIS — I639 Cerebral infarction, unspecified: Secondary | ICD-10-CM | POA: Diagnosis not present

## 2022-04-15 NOTE — Progress Notes (Signed)
[facility] Summary Report / Loop Recorder 

## 2022-05-05 ENCOUNTER — Ambulatory Visit (INDEPENDENT_AMBULATORY_CARE_PROVIDER_SITE_OTHER): Payer: Federal, State, Local not specified - PPO

## 2022-05-05 DIAGNOSIS — I639 Cerebral infarction, unspecified: Secondary | ICD-10-CM

## 2022-05-05 LAB — CUP PACEART REMOTE DEVICE CHECK
Date Time Interrogation Session: 20230531230227
Implantable Pulse Generator Implant Date: 20220210

## 2022-05-08 NOTE — Progress Notes (Signed)
Patient Care Team: Marthe Patch as PCP - General (Physician Assistant) Constance Haw, MD as PCP - Electrophysiology (Cardiology) Magrinat, Virgie Dad, MD (Inactive) as Consulting Physician (Oncology) Vickey Huger, MD as Consulting Physician (Orthopedic Surgery) Marti Sleigh, MD as Consulting Physician (Gynecology) Gery Pray, MD as Consulting Physician (Radiation Oncology) Alphonsa Overall, MD as Consulting Physician (General Surgery) Marti Sleigh, MD as Consulting Physician (Gynecology)  DIAGNOSIS:  Encounter Diagnosis  Name Primary?   Ductal carcinoma in situ (DCIS) of right breast     SUMMARY OF ONCOLOGIC HISTORY: Oncology History  Ductal carcinoma in situ (DCIS) of right breast  12/21/2017 Initial Diagnosis   status post right breast lower outer quadrant biopsy for a ductal carcinoma in situ, high-grade, estrogen and progesterone receptor positive   01/28/2018 Genetic Testing   genetics testing through  [facility] Multi-Cancer panel found no deleterious mutations in (ALK, APC, ATM, AXIN2, BAP1, BARD1, BLM, BMPR1A, BRCA1, BRCA2, BRIP1, CASR, CDC73, CDH1, CDK4, CDKN1B, CDKN1C, CDKN2A, CEBPA, CHEK2, CTNNA1, DICER1, DIS3L2, EGFR, EPCAM, FH, FLCN, GATA2, GPC3, GREM1, HOXB13, HRAS, KIT, MAX, MEN1, MET, MITF, MLH1, MSH2, MSH3, MSH6, MUTYH, NBN, NF1, NF2, NTHL1, PALB2, PDGFRA, PHOX2B, PMS2, POLD1, POLE, POT1, PRKAR1A, PTCH1, PTEN, RAD50, RAD51C, RAD51D, RB1, RECQL4, RET, RUNX1, SDHA, SDHAF2, SDHB, SDHC, SDHD, SMAD4, SMARCA4, SMARCB1, SMARCE1, STK11, SUFU, TERC, TERT, TMEM127, TP53, TSC1, TSC2, VHL, WRN, WT1).   02/19/2018 Surgery   status post right lumpectomy showing ductal carcinoma in situ, high-grade, measuring 3.5 cm, with negative margins.     03/30/2018 - 04/27/2018 Radiation Therapy   adjuvant radiation  Site/dose:   The patient received 40.05 Gy in 15 fractions to the right breast with a 10 Gy boost in 5 fractions   08/2018 -  Anti-estrogen oral  therapy   Tamoxifen daily     CHIEF COMPLIANT: Estrogen receptor positive DCIS  INTERVAL HISTORY: Crystal Trevino is a 81 y.o with the above mentioned h/o Estrogen receptor positive DCIS. She presents to the clinic for a follow-up.  She was previously seen by Dr. Jana Hakim and is transition her care to [place].  I had reviewed her chart extensively and   She denies any lumps or nodules in the breast.   ALLERGIES:  is allergic to penicillins.  MEDICATIONS:  Current Outpatient Medications  Medication Sig Dispense Refill   Multiple Vitamins-Minerals (CENTRUM SILVER 50+WOMEN) TABS Take 1 tablet by mouth daily.     carvedilol (COREG) 12.5 MG tablet Take 12.5 mg by mouth 2 (two) times daily with a meal.     clopidogrel (PLAVIX) 75 MG tablet Take 75 mg by mouth daily.     levothyroxine (SYNTHROID, LEVOTHROID) 75 MCG tablet Take 75 mcg by mouth daily before breakfast.     No current facility-administered medications for this visit.    PHYSICAL EXAMINATION: ECOG PERFORMANCE STATUS: 1 - Symptomatic but completely ambulatory  Vitals:   05/22/22 1544  BP: (!) 169/67  Pulse: 77  Resp: 15  Temp: 97.9 F (36.6 C)  SpO2: 100%   Filed Weights   05/22/22 1544  Weight: 157 lb 14.4 oz (71.6 kg)    BREAST: No palpable masses or nodules in either right or left breasts. No palpable axillary supraclavicular or infraclavicular adenopathy no breast tenderness or nipple discharge. (exam performed in the presence of a chaperone)  LABORATORY DATA:  I have reviewed the data as listed    Latest Ref Rng & Units 01/10/2022    2:41 PM 05/19/2021    9:03 PM  01/08/2021   12:44 AM  CMP  Glucose 70 - 99 mg/dL 118  134  126   BUN 8 - 23 mg/dL $Remove'12  27  12   'vvbJVyc$ Creatinine 0.44 - 1.00 mg/dL 0.87  0.91  0.80   Sodium 135 - 145 mmol/L 137  140  138   Potassium 3.5 - 5.1 mmol/L 4.4  4.0  4.0   Chloride 98 - 111 mmol/L 104  104  103   CO2 22 - 32 mmol/L $RemoveB'25  24  25   'iZFmkrWF$ Calcium 8.9 - 10.3 mg/dL 9.2  9.8  8.7   Total  Protein 6.5 - 8.1 g/dL 6.1  6.6    Total Bilirubin 0.3 - 1.2 mg/dL 2.3  1.5    Alkaline Phos 38 - 126 U/L 72  54    AST 15 - 41 U/L 30  17    ALT 0 - 44 U/L 20  17      Lab Results  Component Value Date   WBC 6.0 01/10/2022   HGB 12.0 01/10/2022   HCT 36.0 01/10/2022   MCV 93.3 01/10/2022   PLT 247 01/10/2022   NEUTROABS 4.4 01/10/2022    ASSESSMENT & PLAN:  Ductal carcinoma in situ (DCIS) of right breast 12/21/2017 Biopsy Rt Breast: ductal carcinoma in situ, high-grade,ER, PR Positive 01/28/18: Genetics Neg 02/19/2018: Rt Lumpectomy:DCIS, high-grade, measuring 3.5 cm, with negative margins. 03/30/2018 to 04/27/2018: Adj XRT Tamoxifen started 08/31/2018 discontinued 01/2021 ----------------------------------------------------------------------------------------------------------- Current treatment: Surveillance  Breast Cancer Surveillance: 1. Breast Exam: 05/22/22: Benign 2. Mammograms at [facility]: Benign  I offered her surveillance with her primary care physician but she would like to be see [place] for peace of mind. Return to clinic in 1 year for follow-up  No orders of the defined types were placed in this encounter.  The patient has a good understanding of the overall plan. she agrees with it. she will call with any problems that may develop before the next visit here. Total time spent: 30 mins including face to face time and time spent for planning, charting and co-ordination of care   Harriette Ohara, MD 05/22/22    I Gardiner Coins am scribing for Dr. Lindi Adie  I have reviewed the above documentation for accuracy and completeness, and I agree with the above.

## 2022-05-21 NOTE — Progress Notes (Signed)
[facility] Summary Report / Loop Recorder 

## 2022-05-22 ENCOUNTER — Other Ambulatory Visit: Payer: Self-pay

## 2022-05-22 ENCOUNTER — Inpatient Hospital Stay
Payer: Federal, State, Local not specified - PPO | Attending: Hematology and Oncology | Admitting: Hematology and Oncology

## 2022-05-22 DIAGNOSIS — Z853 Personal history of malignant neoplasm of breast: Secondary | ICD-10-CM | POA: Diagnosis present

## 2022-05-22 DIAGNOSIS — Z803 Family history of malignant neoplasm of breast: Secondary | ICD-10-CM | POA: Insufficient documentation

## 2022-05-22 DIAGNOSIS — Z7982 Long term (current) use of aspirin: Secondary | ICD-10-CM | POA: Insufficient documentation

## 2022-05-22 DIAGNOSIS — I10 Essential (primary) hypertension: Secondary | ICD-10-CM | POA: Insufficient documentation

## 2022-05-22 DIAGNOSIS — Z8673 Personal history of transient ischemic attack (TIA), and cerebral infarction without residual deficits: Secondary | ICD-10-CM | POA: Insufficient documentation

## 2022-05-22 DIAGNOSIS — Z79899 Other long term (current) drug therapy: Secondary | ICD-10-CM | POA: Insufficient documentation

## 2022-05-22 DIAGNOSIS — D0511 Intraductal carcinoma in situ of right breast: Secondary | ICD-10-CM | POA: Diagnosis not present

## 2022-05-22 DIAGNOSIS — M1711 Unilateral primary osteoarthritis, right knee: Secondary | ICD-10-CM | POA: Insufficient documentation

## 2022-05-22 DIAGNOSIS — E039 Hypothyroidism, unspecified: Secondary | ICD-10-CM | POA: Insufficient documentation

## 2022-05-22 MED ORDER — CENTRUM SILVER 50+WOMEN PO TABS: 1.0000 | ORAL_TABLET | Freq: Every day | ORAL | Status: AC

## 2022-05-22 NOTE — Assessment & Plan Note (Signed)
12/21/2017 Biopsy Rt Breast: ductal carcinoma in situ, high-grade,ER, PR Positive 01/28/18: Genetics Neg 02/19/2018: Rt Lumpectomy:DCIS, high-grade, measuring 3.5 cm, with negative margins. 03/30/2018 to 04/27/2018: Adj XRT Tamoxifen started 08/31/2018 discontinued 01/2021 ----------------------------------------------------------------------------------------------------------- Current treatment: Surveillance  Breast Cancer Surveillance: 1. Breast Exam: 05/22/22: Benign 2. Mammograms

## 2022-05-23 ENCOUNTER — Telehealth: Payer: Self-pay | Admitting: Hematology and Oncology

## 2022-05-23 NOTE — Telephone Encounter (Signed)
Scheduled appointment per 6/22 los. Left message.

## 2022-06-07 LAB — CUP PACEART REMOTE DEVICE CHECK
Date Time Interrogation Session: 20230703230454
Implantable Pulse Generator Implant Date: 20220210

## 2022-06-09 ENCOUNTER — Ambulatory Visit (INDEPENDENT_AMBULATORY_CARE_PROVIDER_SITE_OTHER): Payer: Federal, State, Local not specified - PPO

## 2022-06-09 DIAGNOSIS — I639 Cerebral infarction, unspecified: Secondary | ICD-10-CM

## 2022-07-09 LAB — CUP PACEART REMOTE DEVICE CHECK
Date Time Interrogation Session: 20230805230800
Implantable Pulse Generator Implant Date: 20220210

## 2022-07-09 NOTE — Progress Notes (Signed)
[facility] Summary Report / Loop Recorder 

## 2022-08-18 ENCOUNTER — Ambulatory Visit (INDEPENDENT_AMBULATORY_CARE_PROVIDER_SITE_OTHER): Payer: Federal, State, Local not specified - PPO

## 2022-08-18 DIAGNOSIS — I639 Cerebral infarction, unspecified: Secondary | ICD-10-CM

## 2022-08-18 LAB — CUP PACEART REMOTE DEVICE CHECK
Date Time Interrogation Session: 20230917230249
Implantable Pulse Generator Implant Date: 20220210

## 2022-08-30 NOTE — Progress Notes (Signed)
[facility] Summary Report / Loop Recorder 

## 2022-09-22 ENCOUNTER — Ambulatory Visit (INDEPENDENT_AMBULATORY_CARE_PROVIDER_SITE_OTHER): Payer: Federal, State, Local not specified - PPO

## 2022-09-22 DIAGNOSIS — I639 Cerebral infarction, unspecified: Secondary | ICD-10-CM | POA: Diagnosis not present

## 2022-09-23 LAB — CUP PACEART REMOTE DEVICE CHECK
Date Time Interrogation Session: 20231020230421
Implantable Pulse Generator Implant Date: 20220210

## 2022-10-14 NOTE — Progress Notes (Signed)
[facility] Summary Report / Loop Recorder 

## 2022-10-27 ENCOUNTER — Ambulatory Visit (INDEPENDENT_AMBULATORY_CARE_PROVIDER_SITE_OTHER): Payer: Federal, State, Local not specified - PPO

## 2022-10-27 DIAGNOSIS — I639 Cerebral infarction, unspecified: Secondary | ICD-10-CM | POA: Diagnosis not present

## 2022-10-28 LAB — CUP PACEART REMOTE DEVICE CHECK
Date Time Interrogation Session: 20231126230620
Implantable Pulse Generator Implant Date: 20220210

## 2022-12-02 ENCOUNTER — Ambulatory Visit (INDEPENDENT_AMBULATORY_CARE_PROVIDER_SITE_OTHER): Payer: Federal, State, Local not specified - PPO

## 2022-12-02 DIAGNOSIS — I639 Cerebral infarction, unspecified: Secondary | ICD-10-CM | POA: Diagnosis not present

## 2022-12-02 LAB — CUP PACEART REMOTE DEVICE CHECK
Date Time Interrogation Session: 20240101231219
Implantable Pulse Generator Implant Date: 20220210

## 2022-12-08 NOTE — Progress Notes (Signed)
[facility] Summary Report / Loop Recorder 

## 2022-12-15 ENCOUNTER — Telehealth: Payer: Self-pay

## 2022-12-15 NOTE — Telephone Encounter (Signed)
Alert received from [employer]:  LINQ alert received for atrial fibrillation.  No OAC.   Loop implanted for cryptogenic stroke.

## 2022-12-15 NOTE — Telephone Encounter (Signed)
Per Dr. Dessa Phi to afib clinic for evaluation.  Left detailed message requesting call back to device clinic.

## 2022-12-16 NOTE — Telephone Encounter (Signed)
Patient daughter [name] called back and she would like a call back to go over why her mother is being referred to afib clinic. A detailed msg was left on patient phone but daughter never heard what was said

## 2022-12-17 NOTE — Telephone Encounter (Signed)
Spoke with patients daughter. Appointment scheduled with AF clinic on 12/22/21 at 3:30. Address and phone number to AF clinic given,. Patients daughter stated that patient just recently moved to [place]

## 2022-12-22 ENCOUNTER — Encounter (HOSPITAL_COMMUNITY): Payer: Self-pay | Admitting: Physician Assistant

## 2022-12-22 ENCOUNTER — Ambulatory Visit (HOSPITAL_COMMUNITY)
Admission: RE | Admit: 2022-12-22 | Discharge: 2022-12-22 | Disposition: A | Discharge status: Home / Self Care | Payer: Federal, State, Local not specified - PPO | Source: Ambulatory Visit | Attending: Physician Assistant | Admitting: Physician Assistant

## 2022-12-22 VITALS — BP 150/80 | HR 77 | Ht 64.0 in | Wt 158.2 lb

## 2022-12-22 DIAGNOSIS — I10 Essential (primary) hypertension: Secondary | ICD-10-CM | POA: Insufficient documentation

## 2022-12-22 DIAGNOSIS — E785 Hyperlipidemia, unspecified: Secondary | ICD-10-CM | POA: Diagnosis not present

## 2022-12-22 DIAGNOSIS — I480 Paroxysmal atrial fibrillation: Secondary | ICD-10-CM

## 2022-12-22 DIAGNOSIS — D6869 Other thrombophilia: Secondary | ICD-10-CM | POA: Insufficient documentation

## 2022-12-22 DIAGNOSIS — Z8673 Personal history of transient ischemic attack (TIA), and cerebral infarction without residual deficits: Secondary | ICD-10-CM | POA: Insufficient documentation

## 2022-12-22 DIAGNOSIS — Z79899 Other long term (current) drug therapy: Secondary | ICD-10-CM | POA: Diagnosis not present

## 2022-12-22 DIAGNOSIS — Z853 Personal history of malignant neoplasm of breast: Secondary | ICD-10-CM | POA: Diagnosis not present

## 2022-12-22 DIAGNOSIS — Z7902 Long term (current) use of antithrombotics/antiplatelets: Secondary | ICD-10-CM | POA: Insufficient documentation

## 2022-12-22 DIAGNOSIS — I739 Peripheral vascular disease, unspecified: Secondary | ICD-10-CM | POA: Diagnosis not present

## 2022-12-22 DIAGNOSIS — E039 Hypothyroidism, unspecified: Secondary | ICD-10-CM | POA: Insufficient documentation

## 2022-12-22 DIAGNOSIS — Z79890 Hormone replacement therapy (postmenopausal): Secondary | ICD-10-CM | POA: Insufficient documentation

## 2022-12-22 LAB — CBC
HCT: 36.2 % (ref 36.0–46.0)
Hemoglobin: 11.5 g/dL — ABNORMAL LOW (ref 12.0–15.0)
MCH: 30.3 pg (ref 26.0–34.0)
MCHC: 31.8 g/dL (ref 30.0–36.0)
MCV: 95.5 fL (ref 80.0–100.0)
Platelets: 216 10*3/uL (ref 150–400)
RBC: 3.79 MIL/uL — ABNORMAL LOW (ref 3.87–5.11)
RDW: 14.1 % (ref 11.5–15.5)
WBC: 4.1 10*3/uL (ref 4.0–10.5)
nRBC: 0 % (ref 0.0–0.2)

## 2022-12-22 LAB — BASIC METABOLIC PANEL
Anion gap: 7 (ref 5–15)
BUN: 18 mg/dL (ref 8–23)
CO2: 26 mmol/L (ref 22–32)
Calcium: 9.2 mg/dL (ref 8.9–10.3)
Chloride: 106 mmol/L (ref 98–111)
Creatinine, Ser: 0.84 mg/dL (ref 0.44–1.00)
GFR, Estimated: >60 mL/min (ref >60)
Glucose, Bld: 111 mg/dL — ABNORMAL HIGH (ref 70–99)
Potassium: 4.6 mmol/L (ref 3.5–5.1)
Sodium: 139 mmol/L (ref 135–145)

## 2022-12-22 MED ORDER — APIXABAN 5 MG PO TABS: 5.0000 mg | ORAL_TABLET | Freq: Two times a day (BID) | ORAL | 3 refills | Status: AC

## 2022-12-22 NOTE — Progress Notes (Signed)
Primary Care Physician: Gwyndolyn Kaufman, PA-C Primary Cardiologist: none Primary Electrophysiologist: Dr Curt Bears Referring Physician: Dr Camnitz/device clinic   Crystal Trevino is a 82 y.o. female with a history of breast cancer, HLD, HTN, hypothyroidism, CVA, PVD, atrial fibrillation who presents for consultation in the [facility]. She was hospitalized 01/06/21 with slurred speech and sensory neglect, found with left MCA infarcts embolic secondary to unknown source. EP was consulted to consider loop implant. The device clinic received an alert for an afib episode 12/14/22 which lasted about 3 hours. She was unaware of her afib at the time. Patient has a CHADS2VASC score of 7.  Today, she denies symptoms of palpitations, chest pain, shortness of breath, orthopnea, PND, lower extremity edema, dizziness, presyncope, syncope, snoring, daytime somnolence, bleeding, or neurologic sequela. The patient is tolerating medications without difficulties and is otherwise without complaint today.    Atrial Fibrillation Risk Factors:  she does not have symptoms or diagnosis of sleep apnea. she does not have a history of rheumatic fever. she does not have a history of alcohol use. The patient does have a history of early familial atrial fibrillation or other arrhythmias. Son has afib.   she has a BMI of Body mass index is 27.15 kg/m.Marland Kitchen Filed Weights   12/22/22 1524  Weight: 71.8 kg    Family History  Problem Relation Age of Onset   Heart disease Mother    Breast cancer Mother 61       deceased 8; TAH/BSO (age?)   Heart disease Father    Breast cancer Daughter        dx 67s; currenlty 31; Neg BRCA1/BRCA2   Breast cancer Maternal Aunt 44       deceased 4     Atrial Fibrillation Management history:  Previous antiarrhythmic drugs: none Previous cardioversions: none Previous ablations: none CHADS2VASC score: 7 Anticoagulation history: none   Past Medical  History:  Diagnosis Date   Arthritis    Breast CA ([place])    right breast cancer   Cancer ([place])    HAD RIGHT BREAST BX --GOING TO [facility] ON [facility]ED 12/30/2017   Constipation 08/13/2011   Dizziness 08/13/2011   Family history of adverse reaction to anesthesia    pt mother had PONV   Family history of breast cancer    Genetic testing 01/28/2018   Multi-Cancer panel (83 genes) @ [facility] - No pathogenic mutations detected   GERD (gastroesophageal reflux disease)    Heart murmur    "SLIGHT HEART MURMUR"  WAS PICKED UP AS AN ADULT   Hyperlipidemia    Hypertension    Hypothyroidism    Osteoarthritis of knee    Right   Peripheral vascular disease ([place])    Reflux    Splenic artery aneurysm ([place])    stable 1.5cm by 08/2013 CT; no further surveillance recommended   Stroke ([place]) 01/2021   Weight loss    Past Surgical History:  Procedure Laterality Date   ABDOMINAL HYSTERECTOMY     BACK SURGERY     BREAST LUMPECTOMY WITH RADIOACTIVE SEED LOCALIZATION Right 02/19/2018   Procedure: BREAST LUMPECTOMY WITH RADIOACTIVE SEED LOCALIZATION. RIGHT;  Surgeon: Alphonsa Overall, MD;  Location: [facility];  Service: General;  Laterality: Right;   BREAST SURGERY     CHOLECYSTECTOMY     KNEE SURGERY     LOOP RECORDER INSERTION N/A 01/10/2021   Procedure: LOOP RECORDER INSERTION;  Surgeon: Constance Haw, MD;  Location: [facility];  Service: Cardiovascular;  Laterality: N/A;   LUMBAR LAMINECTOMY     TOTAL KNEE ARTHROPLASTY Right 01/04/2018   TOTAL KNEE ARTHROPLASTY Right 01/04/2018   Procedure: TOTAL KNEE ARTHROPLASTY;  Surgeon: Vickey Huger, MD;  Location: [facility];  Service: Orthopedics;  Laterality: Right;   VAGINAL PROLAPSE REPAIR     2015   DONE IN [place]   VAGINAL PROLAPSE REPAIR  05/20/2018    Current Outpatient Medications  Medication Sig Dispense Refill   atorvastatin (LIPITOR) 20 MG tablet Take 20 mg by mouth daily.     carvedilol (COREG) 12.5 MG tablet Take 12.5 mg by mouth 2 (two)  times daily with a meal.     clopidogrel (PLAVIX) 75 MG tablet Take 75 mg by mouth daily.     fluticasone (FLONASE) 50 MCG/ACT nasal spray Place into the nose.     gabapentin (NEURONTIN) 300 MG capsule Take 600 mg by mouth at bedtime.     levothyroxine (SYNTHROID, LEVOTHROID) 75 MCG tablet Take 75 mcg by mouth daily before breakfast.     Multiple Vitamins-Minerals (CENTRUM SILVER 50+WOMEN) TABS Take 1 tablet by mouth daily.     Multiple Vitamins-Minerals (PRESERVISION AREDS 2) CAPS Take by mouth.     No current facility-administered medications for this encounter.    Allergies  Allergen Reactions   Penicillins Nausea And Vomiting, Rash and Other (See Comments)    PATIENT HAS HAD A PCN REACTION WITH IMMEDIATE RASH, FACIAL/TONGUE/THROAT SWELLING, SOB, OR LIGHTHEADEDNESS WITH HYPOTENSION:  #  #  #  YES  #  #  #   Has patient had a PCN reaction causing severe rash involving mucus membranes or skin necrosis: No Has patient had a PCN reaction that required hospitalization: No Has patient had a PCN reaction occurring within the last 10 years: No     Social History   Socioeconomic History   Marital status: Legally Separated    Spouse name: Not on file   Number of children: Not on file   Years of education: Not on file   Highest education level: Not on file  Occupational History   Not on file  Tobacco Use   Smoking status: Never   Smokeless tobacco: Never   Tobacco comments:    Never smoke 12/22/22  Vaping Use   Vaping Use: Never used  Substance and Sexual Activity   Alcohol use: No   Drug use: No   Sexual activity: Not on file  Other Topics Concern   Not on file  Social History Narrative   Lives with 2 other ladies   Social Determinants of Health   Financial Resource Strain: Not on file  Food Insecurity: Not on file  Transportation Needs: Not on file  Physical Activity: Not on file  Stress: Not on file  Social Connections: Not on file  Intimate Partner Violence: Not on  file     ROS- All systems are reviewed and negative except as per the HPI above.  Physical Exam: Vitals:   12/22/22 1524  BP: (!) 150/80  Pulse: 77  Weight: 71.8 kg  Height: '5\' 4"'$  (1.626 m)    GEN- The patient is a well appearing elderly female, alert and oriented x 3 today.   Head- normocephalic, atraumatic Eyes-  Sclera clear, conjunctiva pink Ears- hearing intact Oropharynx- clear Neck- supple  Lungs- Clear to ausculation bilaterally, normal work of breathing Heart- Regular rate and rhythm, no murmurs, rubs or gallops  GI- soft, NT, ND, + BS Extremities- no clubbing,  cyanosis, or edema MS- no significant deformity or atrophy Skin- no rash or lesion Psych- euthymic mood, full affect Neuro- strength and sensation are intact  Wt Readings from Last 3 Encounters:  12/22/22 71.8 kg  05/22/22 71.6 kg  01/10/22 72.1 kg    EKG today demonstrates  SR Vent. rate 77 BPM PR interval 174 ms QRS duration 90 ms QT/QTcB 380/430 ms  Echo 01/07/21 demonstrated   1. Left ventricular ejection fraction, by estimation, is 60 to 65%. The  left ventricle has normal function. The left ventricle has no regional  wall motion abnormalities. There is mild left ventricular hypertrophy.  Left ventricular diastolic parameters are consistent with Grade II diastolic dysfunction (pseudonormalization).   2. Right ventricular systolic function is normal. The right ventricular  size is normal. There is normal pulmonary artery systolic pressure. The  estimated right ventricular systolic pressure is 70.9 mmHg.   3. The mitral valve is normal in structure. Mild mitral valve  regurgitation. No evidence of mitral stenosis.   4. The aortic valve is tricuspid. Aortic valve regurgitation is mild to  moderate. Mild aortic valve sclerosis is present, with no evidence of  aortic valve stenosis.   5. The inferior vena cava is normal in size with greater than 50%  respiratory variability, suggesting right  atrial pressure of 3 mmHg.   Epic records are reviewed at length today  CHA2DS2-VASc Score = 7  The patient's score is based upon: CHF History: 0 HTN History: 1 Diabetes History: 0 Stroke History: 2 Vascular Disease History: 1 Age Score: 2 Gender Score: 1       ASSESSMENT AND PLAN: 1. Paroxysmal Atrial Fibrillation (ICD10:  I48.0) The patient's CHA2DS2-VASc score is 7, indicating a 11.2% annual risk of stroke.   General education about afib provided and questions answered. We also discussed her stroke risk and the risks and benefits of anticoagulation. Will start Eliquis 5 mg BID. Check bmet/cbc. Stop Plavix. Continue carvedilol 12.5 mg BID Monitor afib burden on ILR  2. Secondary Hypercoagulable State (ICD10:  D68.69) The patient is at significant risk for stroke/thromboembolism based upon her CHA2DS2-VASc Score of 7.  Start Apixaban (Eliquis).   3. HTN Mildly elevated today. Typically better controlled at home.    Patient plans to establish care with cardiology in [place], [place]. Will refer her to [facility] per her request. AF clinic as needed.    [facility] [address] [place], [place] [address] [phone] 12/22/2022 3:39 PM

## 2022-12-22 NOTE — Patient Instructions (Signed)
Stop plavix  Start Eliquis '5mg'$  twice a day

## 2022-12-25 ENCOUNTER — Encounter (HOSPITAL_COMMUNITY): Payer: Self-pay | Admitting: *Deleted

## 2022-12-31 IMAGING — CT CT HEAD CODE STROKE
4 series · 17 of 47 positions shown, 19 images · non-contrast
Comparison: None.

CLINICAL DATA: Code stroke. Neuro deficit, acute stroke suspected.
Slurred speech with right-sided facial droop.

EXAM:
CT HEAD WITHOUT CONTRAST
TECHNIQUE: Contiguous axial images were obtained from the base of the skull
through the vertex without intravenous contrast.

[Series 2: head wo · axial · 0.42mm/px · z∈[-94,+16]mm · 7 of 30 slices shown, 9 images]
[im 4/30  brain]
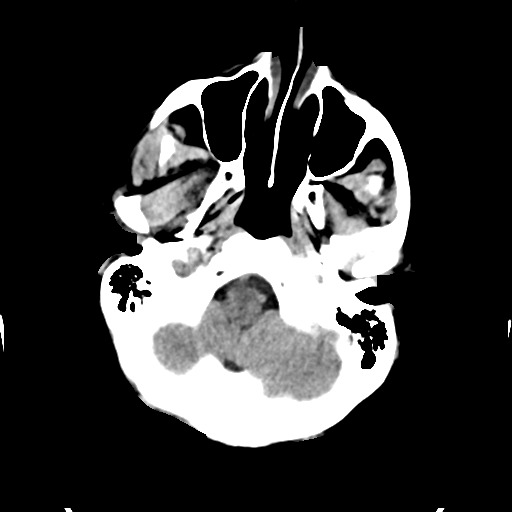
[im 4/30  bone]
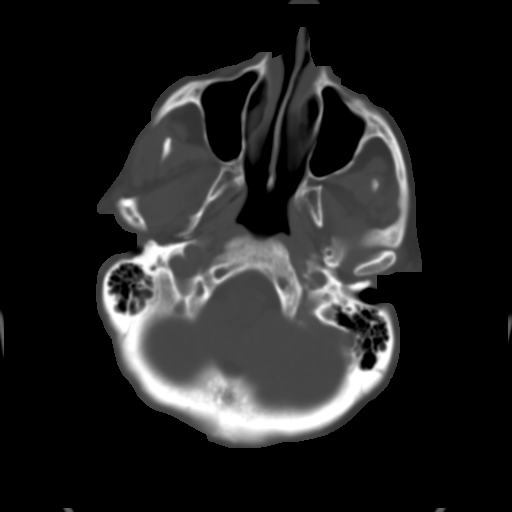
[im 8/30  brain]
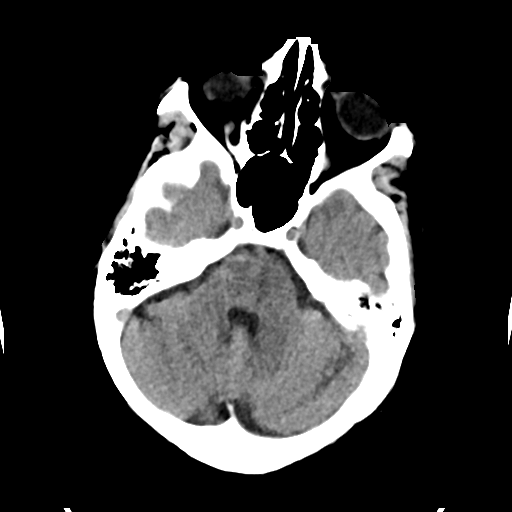
[im 11/30  brain]
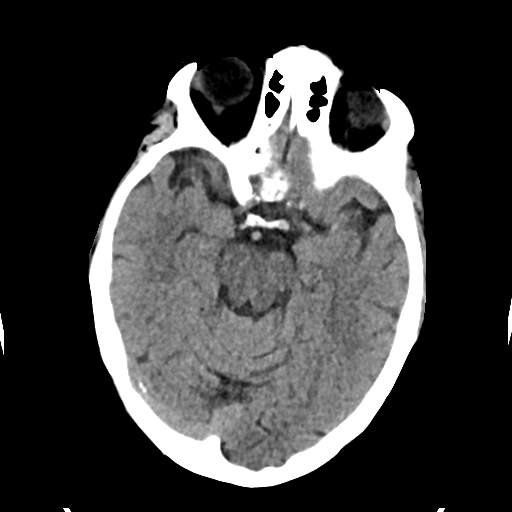
[im 15/30  brain]
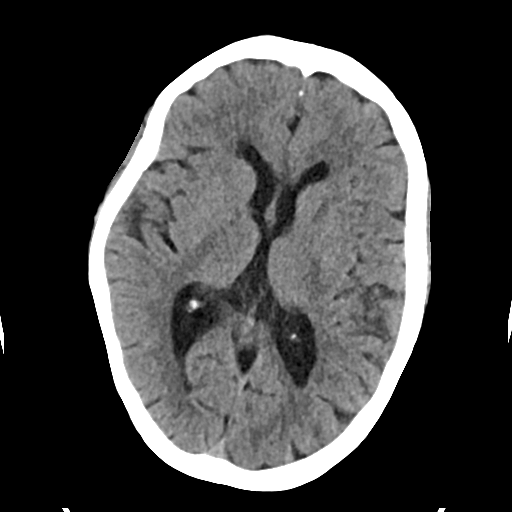
[im 19/30  brain]
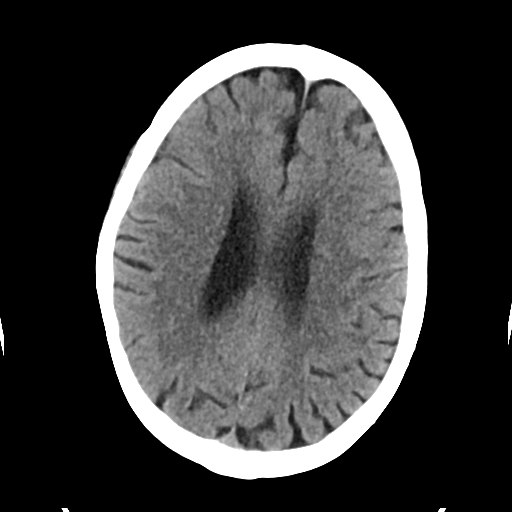
[im 19/30  bone]
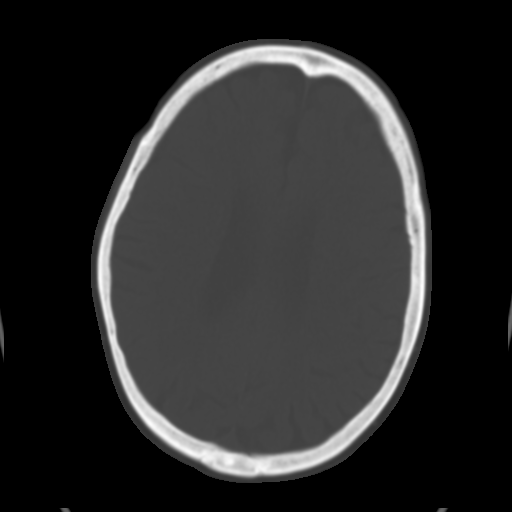
[im 22/30  brain]
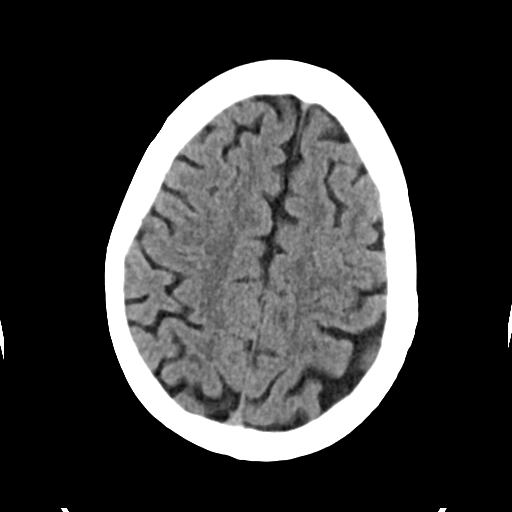
[im 26/30  brain]
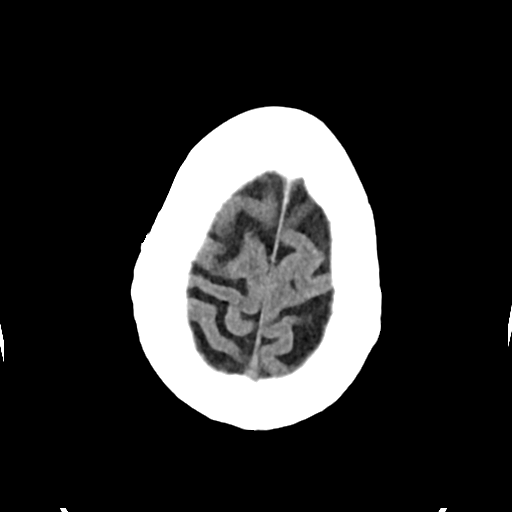

[Series 3: head bone · axial · 0.42mm/px · z∈[-96,-46]mm · 4 of 73 slices shown]
[im 8/73  bone]
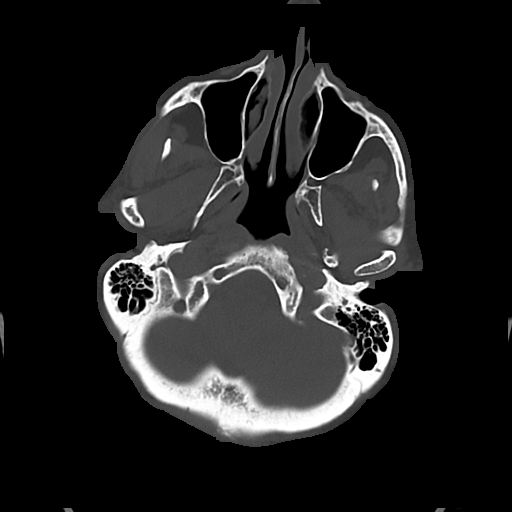
[im 15/73  bone]
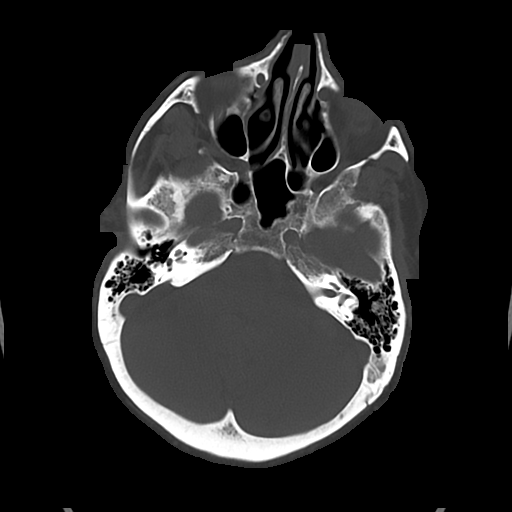
[im 22/73  bone]
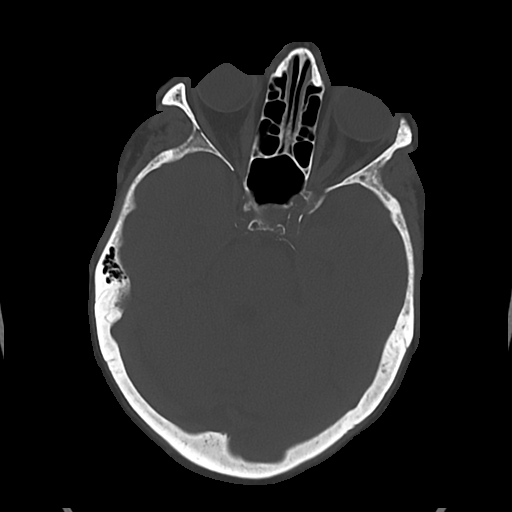
[im 33/73  bone]
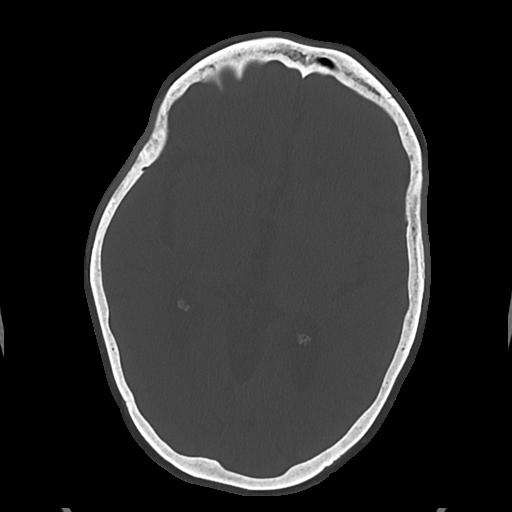

[Series 4: cor soft · coronal · 0.34mm/px · 3 of 71 slices shown]
[im 24/71  brain]
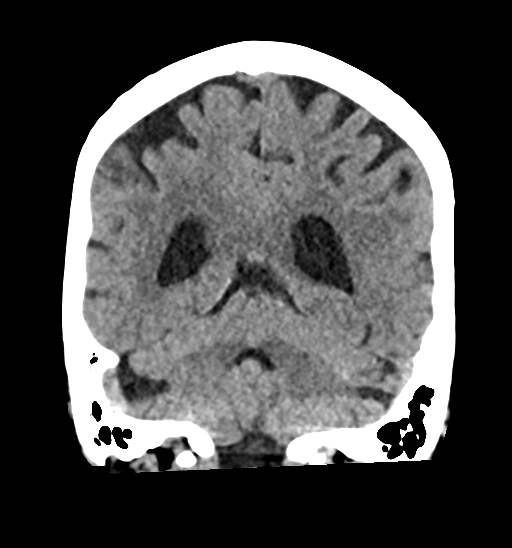
[im 32/71  brain]
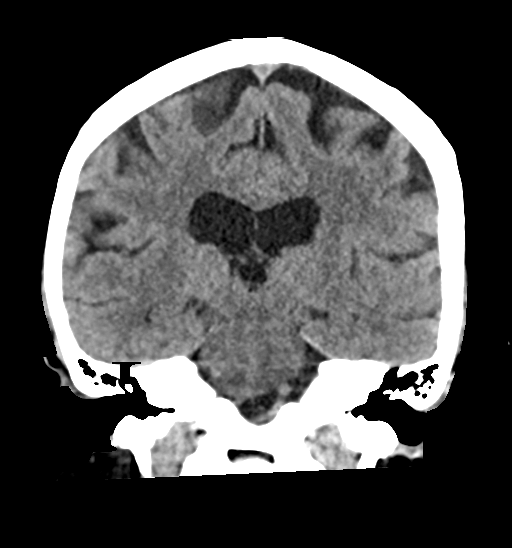
[im 39/71  brain]
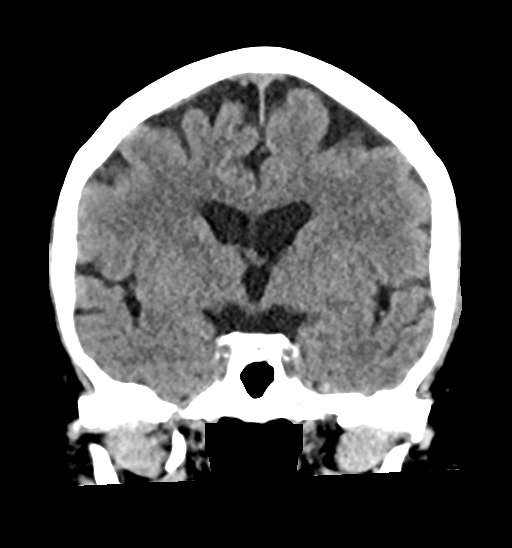

[Series 5: sag soft · sagittal · 0.37mm/px · 3 of 55 slices shown]
[im 19/55  brain]
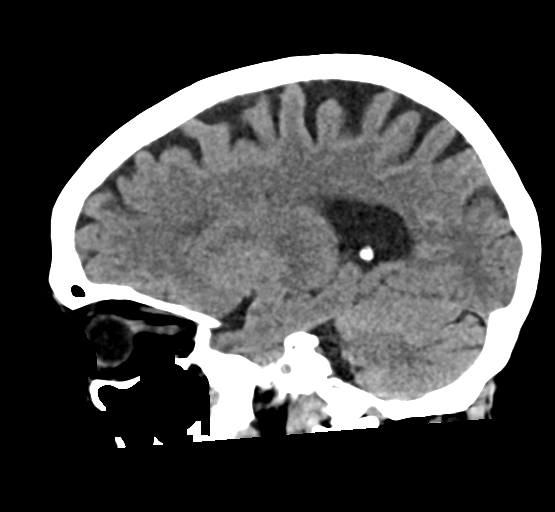
[im 28/55  brain]
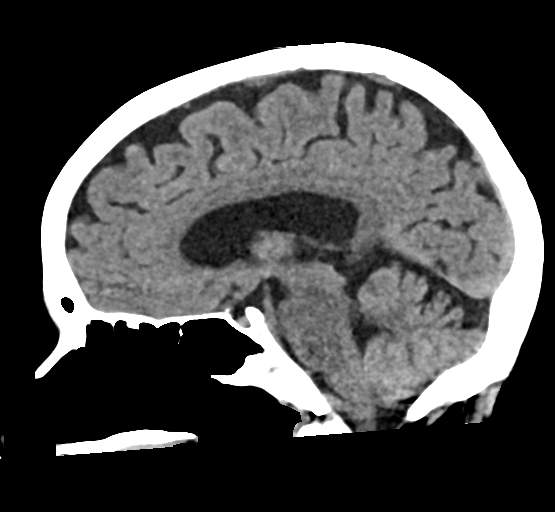
[im 37/55  brain]
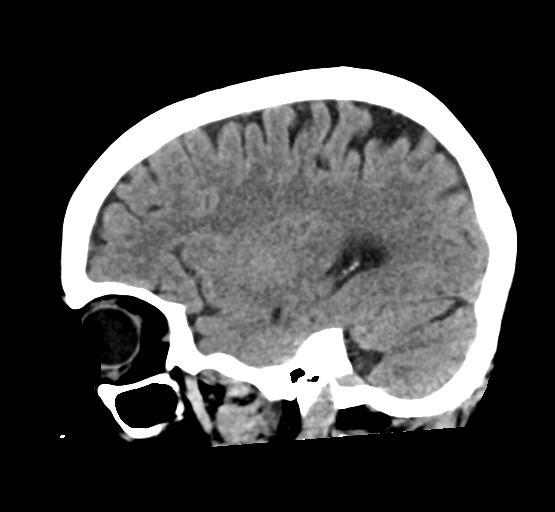

[17 of 47 positions shown; findings below may reference images not displayed]

FINDINGS: Brain: No evidence of acute large vascular territory infarction,
hemorrhage, hydrocephalus, extra-axial collection or mass
lesion/mass effect. Mild patchy white matter hypoattenuation, most
likely related to chronic microvascular ischemic disease.

Vascular: No hyperdense vessel identified. Calcific atherosclerosis.

Skull: No acute fracture.

Sinuses/Orbits: No acute findings.

Other: No mastoid effusions.

ASPECTS ([place] Stroke Program Early CT Score) total score (0-10
with 10 being normal): 10.
IMPRESSION: 1. No evidence of acute intracranial abnormality.
2. ASPECTS is 10.

Code stroke imaging results were communicated on 01/06/2021 at [DATE]
to provider Dr. Maria Elena via telephone, who verbally acknowledged
these results.

## 2023-01-01 NOTE — Progress Notes (Signed)
[facility] Summary Report / Loop Recorder

## 2023-01-04 LAB — CUP PACEART REMOTE DEVICE CHECK
Date Time Interrogation Session: 20240203230405
Implantable Pulse Generator Implant Date: 20220210

## 2023-01-05 ENCOUNTER — Ambulatory Visit: Payer: Federal, State, Local not specified - PPO

## 2023-01-05 DIAGNOSIS — I639 Cerebral infarction, unspecified: Secondary | ICD-10-CM

## 2023-02-09 ENCOUNTER — Ambulatory Visit: Payer: Federal, State, Local not specified - PPO

## 2023-02-09 DIAGNOSIS — I639 Cerebral infarction, unspecified: Secondary | ICD-10-CM

## 2023-02-10 LAB — CUP PACEART REMOTE DEVICE CHECK
Date Time Interrogation Session: 20240310230128
Implantable Pulse Generator Implant Date: 20220210

## 2023-02-18 NOTE — Progress Notes (Signed)
[facility] Summary Report / Loop Recorder 

## 2023-03-13 ENCOUNTER — Telehealth: Payer: Self-pay

## 2023-03-13 NOTE — Telephone Encounter (Signed)
Pt moved to [place], [place] and being followed remotely by [facility] [place].

## 2023-03-16 ENCOUNTER — Ambulatory Visit: Payer: Federal, State, Local not specified - PPO

## 2023-03-18 NOTE — Progress Notes (Signed)
[facility] Summary Report / Loop Recorder 

## 2023-04-20 ENCOUNTER — Ambulatory Visit: Payer: Federal, State, Local not specified - PPO

## 2023-05-25 ENCOUNTER — Ambulatory Visit: Payer: Federal, State, Local not specified - PPO

## 2023-05-25 ENCOUNTER — Inpatient Hospital Stay
Payer: Federal, State, Local not specified - PPO | Attending: Hematology and Oncology | Admitting: Hematology and Oncology

## 2023-05-25 NOTE — Assessment & Plan Note (Deleted)
12/21/2017 Biopsy Rt Breast: ductal carcinoma in situ, high-grade,ER, PR Positive 01/28/18: Genetics Neg 02/19/2018: Rt Lumpectomy:DCIS, high-grade, measuring 3.5 cm, with negative margins. 03/30/2018 to 04/27/2018: Adj XRT Tamoxifen started 08/31/2018 discontinued 01/2021 ----------------------------------------------------------------------------------------------------------- Current treatment: Surveillance   Breast Cancer Surveillance: 1. Breast Exam: 05/25/2023: Benign 2. Mammograms at [facility]: Benign   I offered her surveillance with her primary care physician but she would like to be see [place] for peace of mind. Return to clinic in 1 year for follow-up

## 2024-01-04 IMAGING — CR DG CHEST 2V
2 series · 2 of 2 positions shown · non-contrast
Comparison: 12/25/2011

CLINICAL DATA: Chest pain, shortness of breath

EXAM:
CHEST - 2 VIEW

[chest lat]
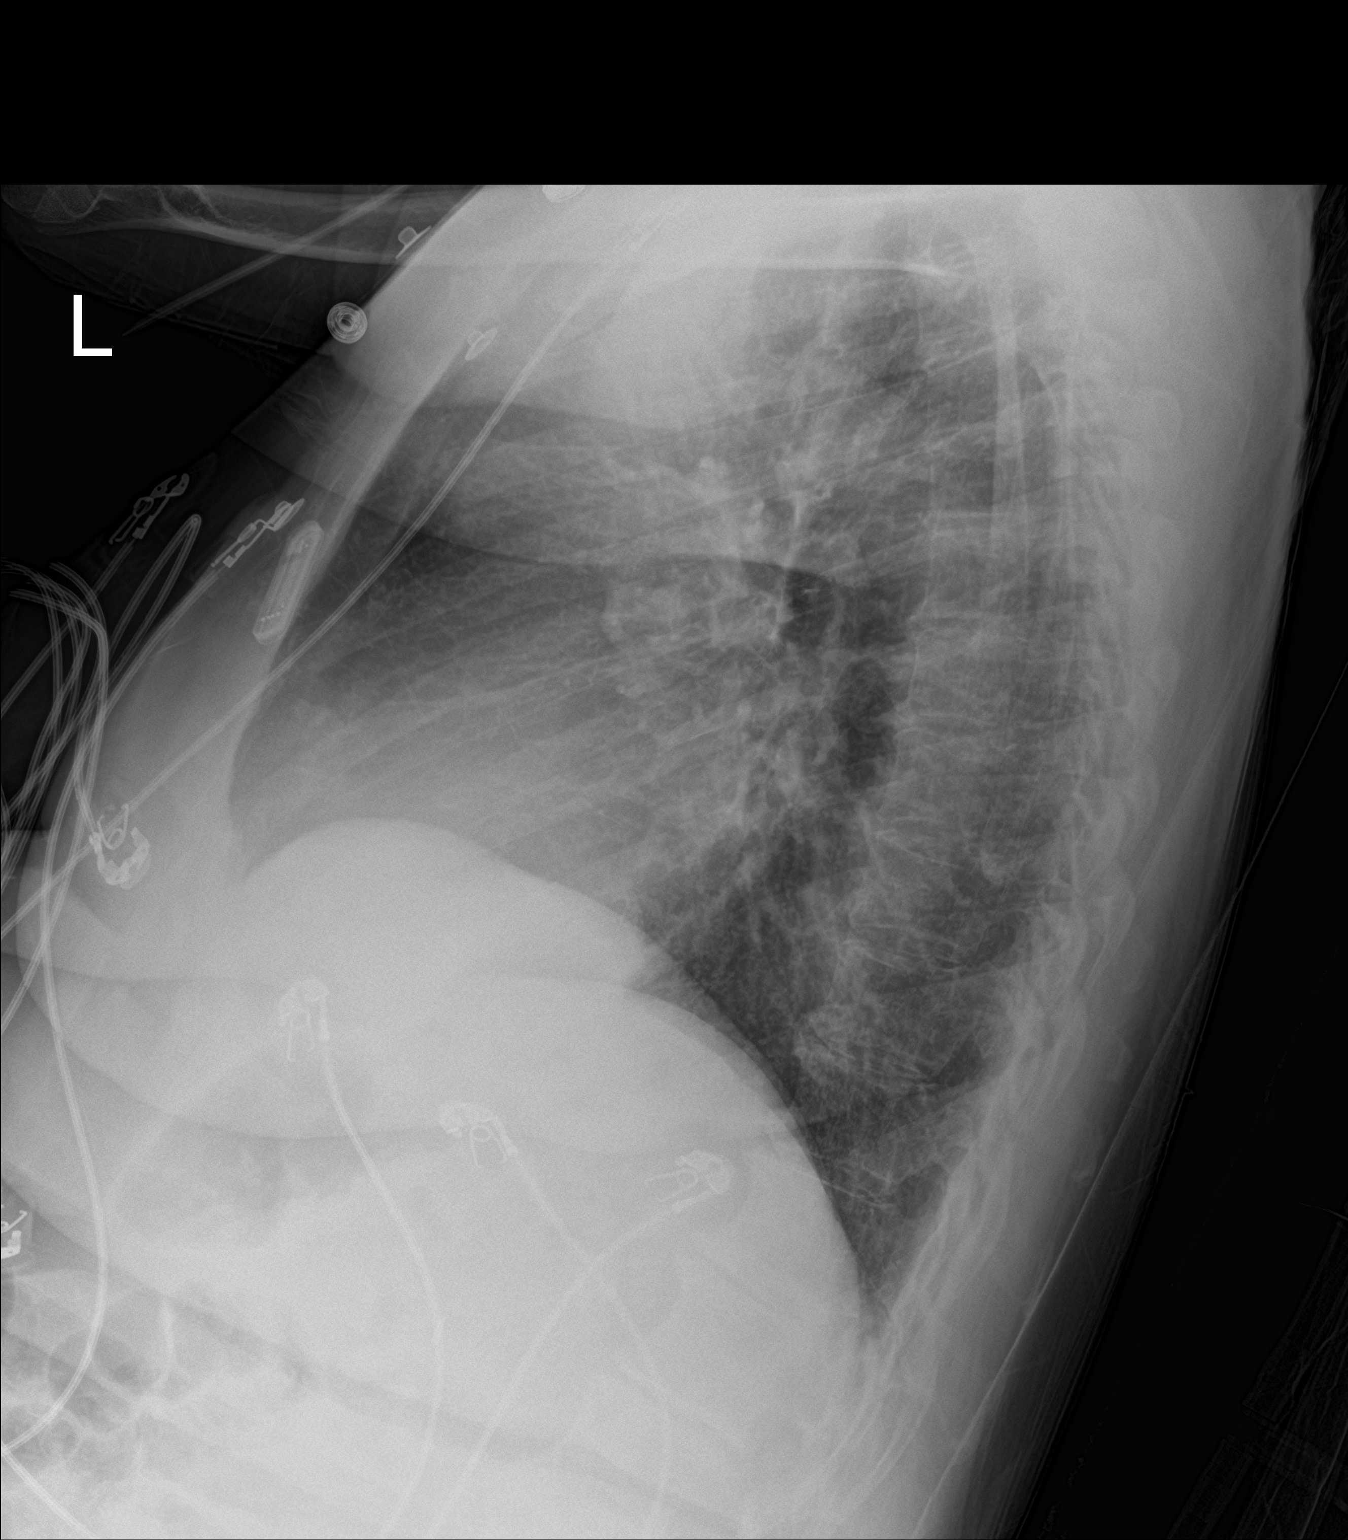

[chest ap]
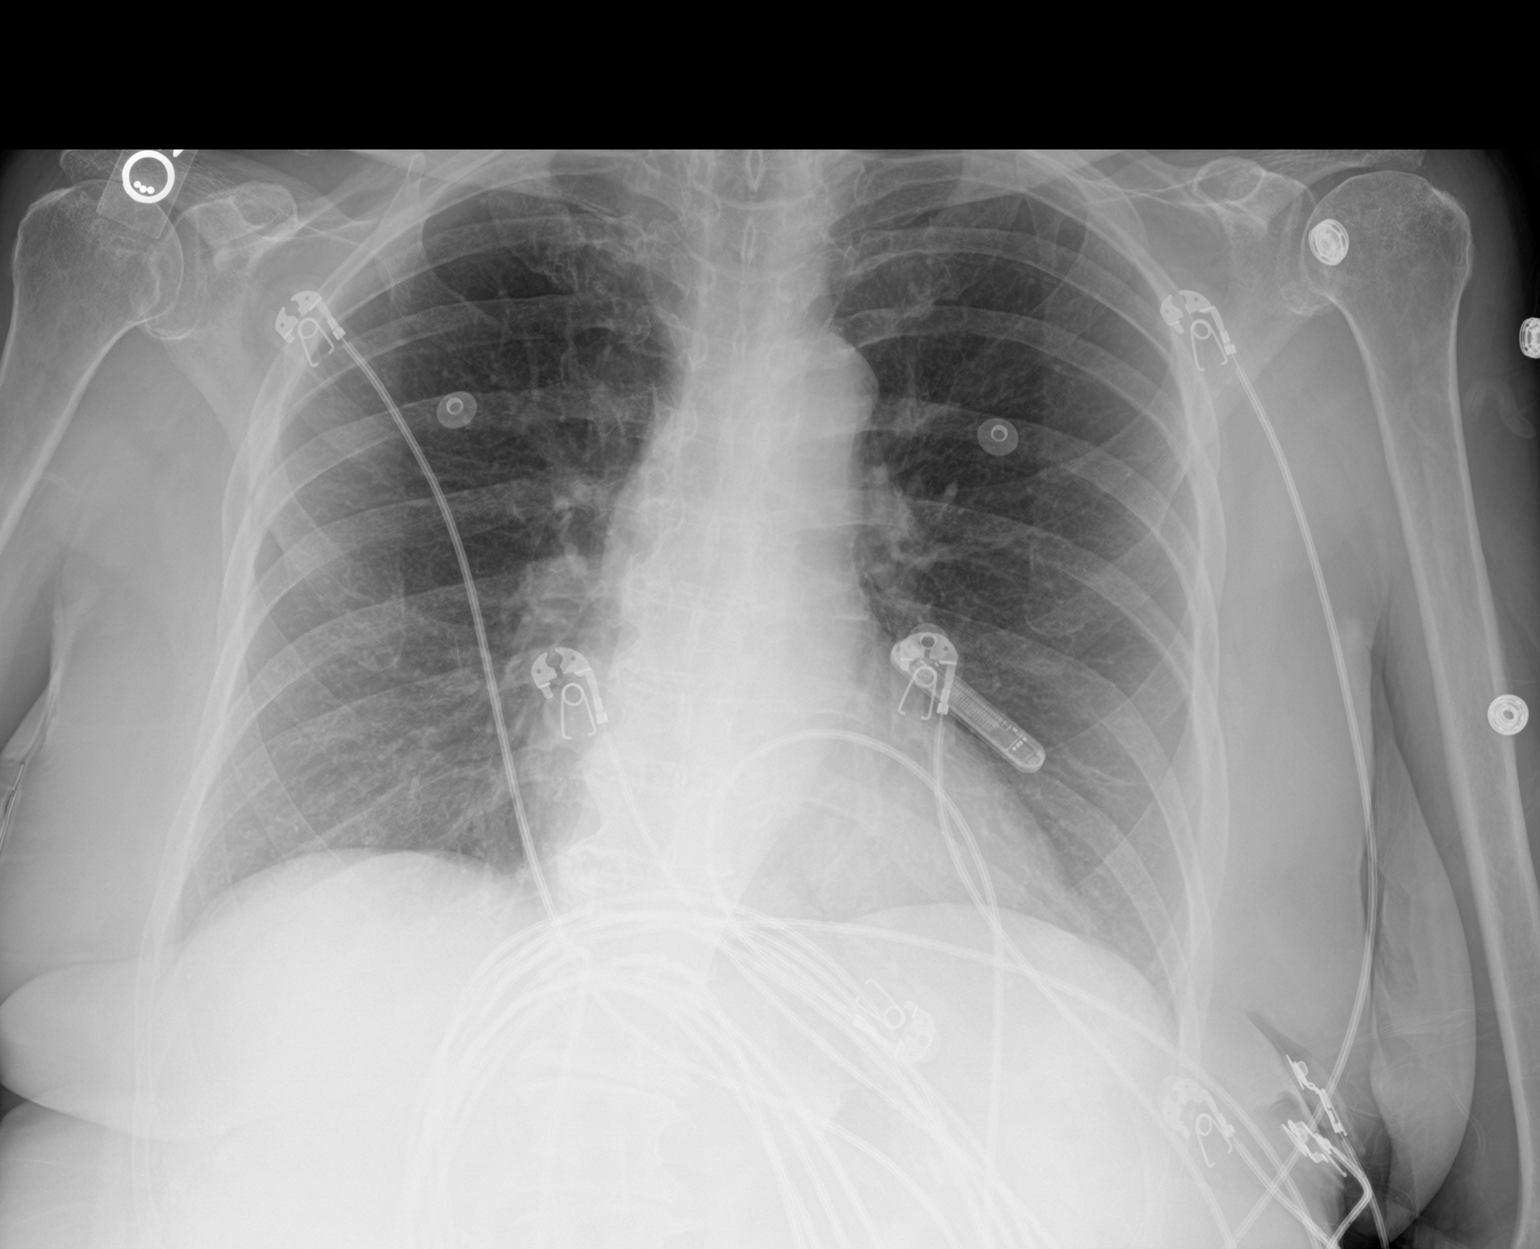

[2 of 2 positions shown; findings below may reference images not displayed]

FINDINGS: Cardiac size is within normal limits. Lung fields are clear of any
infiltrates or pulmonary edema. There is no pleural effusion or
pneumothorax. Implantable cardiac monitoring device is noted in the
left chest wall.
IMPRESSION: No active cardiopulmonary disease.
